# Patient Record
Sex: Male | Born: 1935 | Race: White | Hispanic: No | Marital: Married | Smoking: Never smoker
Health system: Southern US, Community
[De-identification: ages and names within clinical notes are randomized; demographics above are authoritative.]

## PROBLEM LIST (undated history)

## (undated) DIAGNOSIS — F329 Major depressive disorder, single episode, unspecified: Secondary | ICD-10-CM

## (undated) DIAGNOSIS — C4490 Unspecified malignant neoplasm of skin, unspecified: Secondary | ICD-10-CM

## (undated) DIAGNOSIS — E039 Hypothyroidism, unspecified: Secondary | ICD-10-CM

## (undated) DIAGNOSIS — M1990 Unspecified osteoarthritis, unspecified site: Secondary | ICD-10-CM

## (undated) DIAGNOSIS — F419 Anxiety disorder, unspecified: Secondary | ICD-10-CM

## (undated) DIAGNOSIS — I4891 Unspecified atrial fibrillation: Secondary | ICD-10-CM

## (undated) DIAGNOSIS — I2510 Atherosclerotic heart disease of native coronary artery without angina pectoris: Secondary | ICD-10-CM

## (undated) DIAGNOSIS — Z95810 Presence of automatic (implantable) cardiac defibrillator: Secondary | ICD-10-CM

## (undated) DIAGNOSIS — E785 Hyperlipidemia, unspecified: Secondary | ICD-10-CM

## (undated) DIAGNOSIS — I493 Ventricular premature depolarization: Secondary | ICD-10-CM

## (undated) DIAGNOSIS — M47816 Spondylosis without myelopathy or radiculopathy, lumbar region: Secondary | ICD-10-CM

## (undated) DIAGNOSIS — I491 Atrial premature depolarization: Secondary | ICD-10-CM

## (undated) DIAGNOSIS — I255 Ischemic cardiomyopathy: Secondary | ICD-10-CM

## (undated) DIAGNOSIS — Z8679 Personal history of other diseases of the circulatory system: Secondary | ICD-10-CM

## (undated) DIAGNOSIS — Z8719 Personal history of other diseases of the digestive system: Secondary | ICD-10-CM

## (undated) DIAGNOSIS — F32A Depression, unspecified: Secondary | ICD-10-CM

## (undated) DIAGNOSIS — I10 Essential (primary) hypertension: Secondary | ICD-10-CM

## (undated) DIAGNOSIS — I5022 Chronic systolic (congestive) heart failure: Secondary | ICD-10-CM

## (undated) HISTORY — DX: Unspecified osteoarthritis, unspecified site: M19.90

## (undated) HISTORY — PX: SKIN CANCER EXCISION: SHX779

## (undated) HISTORY — PX: LUMBAR FUSION: SHX111

## (undated) HISTORY — DX: Unspecified malignant neoplasm of skin, unspecified: C44.90

## (undated) HISTORY — DX: Ischemic cardiomyopathy: I25.5

## (undated) HISTORY — PX: BACK SURGERY: SHX140

## (undated) HISTORY — DX: Personal history of other diseases of the circulatory system: Z86.79

## (undated) HISTORY — DX: Anxiety disorder, unspecified: F41.9

## (undated) HISTORY — DX: Depression, unspecified: F32.A

## (undated) HISTORY — PX: OTHER SURGICAL HISTORY: SHX169

## (undated) HISTORY — PX: INGUINAL HERNIA REPAIR: SUR1180

## (undated) HISTORY — DX: Hypothyroidism, unspecified: E03.9

## (undated) HISTORY — DX: Major depressive disorder, single episode, unspecified: F32.9

## (undated) HISTORY — PX: MOHS SURGERY: SUR867

## (undated) HISTORY — DX: Presence of automatic (implantable) cardiac defibrillator: Z95.810

## (undated) HISTORY — DX: Unspecified atrial fibrillation: I48.91

## (undated) HISTORY — DX: Hyperlipidemia, unspecified: E78.5

## (undated) HISTORY — DX: Atherosclerotic heart disease of native coronary artery without angina pectoris: I25.10

## (undated) HISTORY — DX: Essential (primary) hypertension: I10

---

## 1999-04-17 ENCOUNTER — Ambulatory Visit (HOSPITAL_BASED_OUTPATIENT_CLINIC_OR_DEPARTMENT_OTHER): Admission: RE | Admit: 1999-04-17 | Discharge: 1999-04-17 | Payer: Self-pay | Admitting: General Surgery

## 1999-05-01 ENCOUNTER — Ambulatory Visit (HOSPITAL_BASED_OUTPATIENT_CLINIC_OR_DEPARTMENT_OTHER): Admission: RE | Admit: 1999-05-01 | Discharge: 1999-05-01 | Payer: Self-pay | Admitting: General Surgery

## 2000-01-22 ENCOUNTER — Encounter: Admission: RE | Admit: 2000-01-22 | Discharge: 2000-01-22 | Payer: Self-pay | Admitting: Neurosurgery

## 2000-01-22 ENCOUNTER — Encounter: Payer: Self-pay | Admitting: Neurosurgery

## 2001-11-22 ENCOUNTER — Emergency Department (HOSPITAL_COMMUNITY): Admission: EM | Admit: 2001-11-22 | Discharge: 2001-11-22 | Payer: Self-pay | Admitting: Emergency Medicine

## 2001-11-22 ENCOUNTER — Encounter: Payer: Self-pay | Admitting: Emergency Medicine

## 2001-12-12 ENCOUNTER — Encounter: Admission: RE | Admit: 2001-12-12 | Discharge: 2001-12-12 | Payer: Self-pay | Admitting: Family Medicine

## 2001-12-12 ENCOUNTER — Encounter: Payer: Self-pay | Admitting: Family Medicine

## 2002-01-09 ENCOUNTER — Encounter: Admission: RE | Admit: 2002-01-09 | Discharge: 2002-01-09 | Payer: Self-pay | Admitting: Neurosurgery

## 2002-01-09 ENCOUNTER — Encounter: Payer: Self-pay | Admitting: Neurosurgery

## 2002-01-24 ENCOUNTER — Encounter: Admission: RE | Admit: 2002-01-24 | Discharge: 2002-01-24 | Payer: Self-pay | Admitting: Neurosurgery

## 2002-01-24 ENCOUNTER — Encounter: Payer: Self-pay | Admitting: Neurosurgery

## 2002-02-15 ENCOUNTER — Encounter: Payer: Self-pay | Admitting: Neurosurgery

## 2002-02-17 ENCOUNTER — Encounter: Payer: Self-pay | Admitting: Neurosurgery

## 2002-02-17 ENCOUNTER — Inpatient Hospital Stay (HOSPITAL_COMMUNITY): Admission: RE | Admit: 2002-02-17 | Discharge: 2002-02-18 | Payer: Self-pay | Admitting: Neurosurgery

## 2002-04-28 ENCOUNTER — Encounter: Payer: Self-pay | Admitting: *Deleted

## 2002-04-28 ENCOUNTER — Inpatient Hospital Stay (HOSPITAL_COMMUNITY): Admission: EM | Admit: 2002-04-28 | Discharge: 2002-05-02 | Payer: Self-pay | Admitting: Emergency Medicine

## 2003-01-08 ENCOUNTER — Encounter: Admission: RE | Admit: 2003-01-08 | Discharge: 2003-01-08 | Payer: Self-pay | Admitting: Neurosurgery

## 2003-01-08 ENCOUNTER — Encounter: Payer: Self-pay | Admitting: Neurosurgery

## 2003-10-23 ENCOUNTER — Encounter: Admission: RE | Admit: 2003-10-23 | Discharge: 2003-11-04 | Payer: Self-pay | Admitting: Cardiology

## 2003-10-23 ENCOUNTER — Inpatient Hospital Stay (HOSPITAL_COMMUNITY): Admission: EM | Admit: 2003-10-23 | Discharge: 2003-11-04 | Payer: Self-pay | Admitting: Emergency Medicine

## 2003-10-24 ENCOUNTER — Encounter: Payer: Self-pay | Admitting: Cardiology

## 2003-12-03 ENCOUNTER — Encounter (HOSPITAL_COMMUNITY): Admission: RE | Admit: 2003-12-03 | Discharge: 2004-03-02 | Payer: Self-pay | Admitting: Cardiology

## 2004-06-11 ENCOUNTER — Inpatient Hospital Stay (HOSPITAL_COMMUNITY): Admission: EM | Admit: 2004-06-11 | Discharge: 2004-06-14 | Payer: Self-pay | Admitting: Emergency Medicine

## 2004-10-01 ENCOUNTER — Encounter: Admission: RE | Admit: 2004-10-01 | Discharge: 2004-10-01 | Payer: Self-pay | Admitting: Family Medicine

## 2004-10-09 ENCOUNTER — Ambulatory Visit: Payer: Self-pay | Admitting: Cardiology

## 2004-12-11 ENCOUNTER — Encounter: Admission: RE | Admit: 2004-12-11 | Discharge: 2004-12-11 | Payer: Self-pay | Admitting: Neurosurgery

## 2004-12-29 ENCOUNTER — Encounter: Admission: RE | Admit: 2004-12-29 | Discharge: 2004-12-29 | Payer: Self-pay | Admitting: Neurosurgery

## 2005-01-15 ENCOUNTER — Encounter: Admission: RE | Admit: 2005-01-15 | Discharge: 2005-01-15 | Payer: Self-pay | Admitting: Neurosurgery

## 2005-01-29 ENCOUNTER — Ambulatory Visit: Payer: Self-pay | Admitting: Cardiology

## 2005-01-30 ENCOUNTER — Encounter: Admission: RE | Admit: 2005-01-30 | Discharge: 2005-01-30 | Payer: Self-pay | Admitting: Neurosurgery

## 2005-04-08 ENCOUNTER — Ambulatory Visit: Payer: Self-pay | Admitting: Cardiology

## 2005-04-29 ENCOUNTER — Ambulatory Visit: Payer: Self-pay | Admitting: Cardiology

## 2005-05-25 ENCOUNTER — Ambulatory Visit: Payer: Self-pay

## 2005-06-05 ENCOUNTER — Ambulatory Visit: Payer: Self-pay | Admitting: Cardiology

## 2005-07-13 ENCOUNTER — Ambulatory Visit: Payer: Self-pay | Admitting: Cardiology

## 2005-10-01 ENCOUNTER — Encounter: Admission: RE | Admit: 2005-10-01 | Discharge: 2005-10-01 | Payer: Self-pay | Admitting: Neurosurgery

## 2005-11-11 ENCOUNTER — Ambulatory Visit: Payer: Self-pay | Admitting: Cardiology

## 2005-11-19 ENCOUNTER — Ambulatory Visit: Payer: Self-pay | Admitting: *Deleted

## 2005-11-19 ENCOUNTER — Ambulatory Visit: Payer: Self-pay

## 2006-03-12 ENCOUNTER — Ambulatory Visit: Payer: Self-pay | Admitting: Cardiology

## 2006-03-25 ENCOUNTER — Ambulatory Visit: Payer: Self-pay | Admitting: Cardiology

## 2006-06-24 ENCOUNTER — Inpatient Hospital Stay (HOSPITAL_COMMUNITY): Admission: EM | Admit: 2006-06-24 | Discharge: 2006-06-25 | Payer: Self-pay | Admitting: Emergency Medicine

## 2006-06-24 ENCOUNTER — Ambulatory Visit: Payer: Self-pay | Admitting: Internal Medicine

## 2006-06-30 ENCOUNTER — Ambulatory Visit: Payer: Self-pay | Admitting: Internal Medicine

## 2006-07-06 ENCOUNTER — Inpatient Hospital Stay (HOSPITAL_COMMUNITY): Admission: RE | Admit: 2006-07-06 | Discharge: 2006-07-07 | Payer: Self-pay | Admitting: Internal Medicine

## 2006-07-06 ENCOUNTER — Ambulatory Visit: Payer: Self-pay | Admitting: Internal Medicine

## 2006-07-20 ENCOUNTER — Ambulatory Visit: Payer: Self-pay | Admitting: Cardiology

## 2006-11-09 ENCOUNTER — Ambulatory Visit: Payer: Self-pay | Admitting: Internal Medicine

## 2006-12-28 ENCOUNTER — Ambulatory Visit: Payer: Self-pay | Admitting: Cardiology

## 2007-03-10 ENCOUNTER — Ambulatory Visit: Payer: Self-pay

## 2007-03-11 ENCOUNTER — Ambulatory Visit: Payer: Self-pay | Admitting: Cardiology

## 2007-05-12 ENCOUNTER — Inpatient Hospital Stay (HOSPITAL_COMMUNITY): Admission: EM | Admit: 2007-05-12 | Discharge: 2007-05-14 | Payer: Self-pay | Admitting: *Deleted

## 2007-05-12 ENCOUNTER — Ambulatory Visit: Payer: Self-pay | Admitting: Cardiology

## 2007-06-10 ENCOUNTER — Ambulatory Visit: Payer: Self-pay | Admitting: Cardiology

## 2007-06-30 ENCOUNTER — Ambulatory Visit: Payer: Self-pay

## 2007-08-21 ENCOUNTER — Ambulatory Visit: Payer: Self-pay | Admitting: Cardiology

## 2007-08-21 ENCOUNTER — Emergency Department (HOSPITAL_COMMUNITY): Admission: EM | Admit: 2007-08-21 | Discharge: 2007-08-21 | Payer: Self-pay | Admitting: Emergency Medicine

## 2007-08-23 ENCOUNTER — Ambulatory Visit: Payer: Self-pay | Admitting: Cardiology

## 2007-09-16 ENCOUNTER — Ambulatory Visit: Payer: Self-pay | Admitting: Cardiology

## 2007-12-01 ENCOUNTER — Ambulatory Visit: Payer: Self-pay

## 2007-12-14 ENCOUNTER — Ambulatory Visit: Payer: Self-pay | Admitting: Cardiology

## 2008-03-13 ENCOUNTER — Ambulatory Visit: Payer: Self-pay | Admitting: Internal Medicine

## 2008-06-06 ENCOUNTER — Ambulatory Visit: Payer: Self-pay | Admitting: Cardiology

## 2008-09-06 ENCOUNTER — Ambulatory Visit: Payer: Self-pay

## 2008-12-17 ENCOUNTER — Encounter: Admission: RE | Admit: 2008-12-17 | Discharge: 2008-12-17 | Payer: Self-pay | Admitting: Neurosurgery

## 2008-12-18 ENCOUNTER — Ambulatory Visit: Payer: Self-pay | Admitting: Internal Medicine

## 2009-01-01 ENCOUNTER — Inpatient Hospital Stay (HOSPITAL_COMMUNITY): Admission: EM | Admit: 2009-01-01 | Discharge: 2009-01-02 | Payer: Self-pay | Admitting: Emergency Medicine

## 2009-01-01 ENCOUNTER — Ambulatory Visit: Payer: Self-pay | Admitting: Cardiovascular Disease

## 2009-01-01 ENCOUNTER — Encounter: Payer: Self-pay | Admitting: Internal Medicine

## 2009-01-16 ENCOUNTER — Ambulatory Visit: Payer: Self-pay | Admitting: Cardiology

## 2009-02-08 ENCOUNTER — Encounter: Admission: RE | Admit: 2009-02-08 | Discharge: 2009-02-08 | Payer: Self-pay | Admitting: Neurosurgery

## 2009-04-08 ENCOUNTER — Ambulatory Visit: Payer: Self-pay | Admitting: Internal Medicine

## 2009-04-09 DIAGNOSIS — M1990 Unspecified osteoarthritis, unspecified site: Secondary | ICD-10-CM | POA: Insufficient documentation

## 2009-04-09 HISTORY — DX: Unspecified osteoarthritis, unspecified site: M19.90

## 2009-04-11 ENCOUNTER — Ambulatory Visit: Payer: Self-pay | Admitting: Internal Medicine

## 2009-06-14 ENCOUNTER — Encounter (INDEPENDENT_AMBULATORY_CARE_PROVIDER_SITE_OTHER): Payer: Self-pay | Admitting: *Deleted

## 2009-06-20 ENCOUNTER — Ambulatory Visit: Payer: Self-pay | Admitting: Internal Medicine

## 2009-07-08 ENCOUNTER — Ambulatory Visit: Payer: Self-pay

## 2009-07-11 ENCOUNTER — Ambulatory Visit: Payer: Self-pay | Admitting: Cardiology

## 2009-07-22 ENCOUNTER — Encounter: Admission: RE | Admit: 2009-07-22 | Discharge: 2009-07-22 | Payer: Self-pay | Admitting: Neurosurgery

## 2009-08-21 ENCOUNTER — Ambulatory Visit: Payer: Self-pay | Admitting: Internal Medicine

## 2009-09-09 ENCOUNTER — Encounter: Admission: RE | Admit: 2009-09-09 | Discharge: 2009-09-09 | Payer: Self-pay | Admitting: Neurosurgery

## 2009-09-19 ENCOUNTER — Encounter: Payer: Self-pay | Admitting: Cardiology

## 2009-09-25 ENCOUNTER — Encounter (INDEPENDENT_AMBULATORY_CARE_PROVIDER_SITE_OTHER): Payer: Self-pay | Admitting: *Deleted

## 2009-10-07 ENCOUNTER — Ambulatory Visit: Payer: Self-pay | Admitting: Internal Medicine

## 2009-11-06 ENCOUNTER — Encounter (INDEPENDENT_AMBULATORY_CARE_PROVIDER_SITE_OTHER): Payer: Self-pay | Admitting: *Deleted

## 2009-12-10 ENCOUNTER — Inpatient Hospital Stay (HOSPITAL_COMMUNITY): Admission: RE | Admit: 2009-12-10 | Discharge: 2009-12-15 | Payer: Self-pay | Admitting: Neurosurgery

## 2010-02-28 ENCOUNTER — Ambulatory Visit: Payer: Self-pay | Admitting: Internal Medicine

## 2010-03-12 ENCOUNTER — Telehealth (INDEPENDENT_AMBULATORY_CARE_PROVIDER_SITE_OTHER): Payer: Self-pay | Admitting: *Deleted

## 2010-04-08 DIAGNOSIS — Z8679 Personal history of other diseases of the circulatory system: Secondary | ICD-10-CM | POA: Insufficient documentation

## 2010-04-10 ENCOUNTER — Ambulatory Visit: Payer: Self-pay | Admitting: Cardiology

## 2010-04-25 ENCOUNTER — Ambulatory Visit: Payer: Self-pay | Admitting: Internal Medicine

## 2010-06-24 ENCOUNTER — Encounter: Payer: Self-pay | Admitting: Internal Medicine

## 2010-08-07 ENCOUNTER — Ambulatory Visit: Payer: Self-pay | Admitting: Internal Medicine

## 2010-09-05 ENCOUNTER — Ambulatory Visit: Payer: Self-pay | Admitting: Internal Medicine

## 2010-10-06 ENCOUNTER — Encounter: Payer: Self-pay | Admitting: Cardiology

## 2010-10-06 ENCOUNTER — Ambulatory Visit: Payer: Self-pay | Admitting: Cardiology

## 2010-11-06 ENCOUNTER — Ambulatory Visit: Payer: Self-pay

## 2010-11-18 ENCOUNTER — Encounter
Admission: RE | Admit: 2010-11-18 | Discharge: 2010-11-18 | Payer: Self-pay | Source: Home / Self Care | Attending: Internal Medicine | Admitting: Internal Medicine

## 2010-11-18 ENCOUNTER — Ambulatory Visit
Admission: RE | Admit: 2010-11-18 | Discharge: 2010-11-18 | Payer: Self-pay | Source: Home / Self Care | Attending: Internal Medicine | Admitting: Internal Medicine

## 2010-12-23 NOTE — Procedures (Signed)
Summary: icd check/medtronic  Medications Added * CALCIUM-VITAMIN D 600-500 MG-UNIT TABS (CALCIUM-VITAMIN D) one by mouth two times a day      Allergies Added: NKDA  Current Medications (verified): 1)  Spironolactone 25 Mg Tabs (Spironolactone) .... Take 1/2 Tablet By Mouth Daily 2)  Aspirin 81 Mg Tbec (Aspirin) .... Take One Tablet By Mouth Daily 3)  Plavix 75 Mg Tabs (Clopidogrel Bisulfate) .... Take One Tablet By Mouth Daily 4)  Carvedilol 6.25 Mg Tabs (Carvedilol) .... Take One Tablet By Mouth Twice A Day 5)  Levothroid 50 Mcg Tabs (Levothyroxine Sodium) .Marland Kitchen.. 1 By Mouth Once Daily 6)  Vytorin 10-40 Mg Tabs (Ezetimibe-Simvastatin) .... Take One Tablet By Mouth Dailyat Bedtime 7)  Ramipril 2.5 Mg Caps (Ramipril) .... Take One Capsule By Mouth Daily 8)  Omeprazole 20 Mg Cpdr (Omeprazole) .... Once Daily 9)  Nitroglycerin 0.4 Mg Subl (Nitroglycerin) .... One Tablet Under Tongue Every 5 Minutes As Needed For Chest Pain---May Repeat Times Three 10)  Calcium-Vitamin D 600-500 Mg-Unit Tabs (Calcium-Vitamin D) .... One By Mouth Two Times A Day  Allergies (verified): No Known Drug Allergies   ICD Specifications Following MD:  Lewayne Bunting, MD     Referring MD:  WALL ICD Vendor:  Medtronic     ICD Model Number:  7232     ICD Serial Number:  [id number] ICD DOI:  07/06/2006     ICD Implanting MD:  Lewayne Bunting, MD  Lead 1:    Location: RV     DOI: 07/06/2006     Model #: [id number]     Serial #: [id number]     Status: active  Indications::  ICM  Explantation Comments: No carelink  ICD Follow Up Remote Check?  No Battery Voltage:  3.07 V     Charge Time:  8.32 seconds     Underlying rhythm:  SR ICD Dependent:  No       ICD Device Measurements Right Ventricle:  Amplitude: 17.7 mV, Impedance: 760 ohms, Threshold: 1.0 V at 0.2 msec Shock Impedance: 46/57 ohms   Episodes Coumadin:  No Shock:  0     ATP:  0     Nonsustained:  0     Ventricular Pacing:  <0.1%  Brady Parameters Mode VVI      Lower Rate Limit:  40      Tachy Zones VF:  200     VT:  240 FVT VIA VF     VT1:  171     Next Cardiology Appt Due:  10/23/2010 Tech Comments:  No parameter changes.  Device function normal.  ROV 3 months clinic. Altha Harm, LPN  August 07, 2010 11:08 AM

## 2010-12-23 NOTE — Assessment & Plan Note (Signed)
Summary: f59m/jss  Medications Added VITAMIN D (ERGOCALCIFEROL) 50000 UNIT CAPS (ERGOCALCIFEROL) 1 cap weekly        Visit Type:  6 mo f/u Primary Provider:  Dr. Marlan Palau  CC:  pt had recent back surgery 12/10/09....no cardiac complaints today.  History of Present Illness: Mr. Ventrella returns today for evaluation and management of his coronary artery disease, previous infarct, chronic LV dysfunction, and defibrillator.  He has had no angina or ischemic symptoms. He denies orthopnea, PND or edema. He was had back surgery about 3 months ago had no complications.  He is very compliant with his medications. His febrile has not fired. Last catheterization was February 2010. Please see below.  Multiple blood pressures checked at time have been normal. His log has been reviewed.  Current Medications (verified): 1)  Spironolactone 25 Mg Tabs (Spironolactone) .... Take 1/2 Tablet By Mouth Daily 2)  Aspirin 81 Mg Tbec (Aspirin) .... Take One Tablet By Mouth Daily 3)  Plavix 75 Mg Tabs (Clopidogrel Bisulfate) .... Take One Tablet By Mouth Daily 4)  Carvedilol 6.25 Mg Tabs (Carvedilol) .... Take One Tablet By Mouth Twice A Day 5)  Levothroid 50 Mcg Tabs (Levothyroxine Sodium) .Marland Kitchen.. 1 By Mouth Once Daily 6)  Vytorin 10-40 Mg Tabs (Ezetimibe-Simvastatin) .... Take One Tablet By Mouth Dailyat Bedtime 7)  Ramipril 2.5 Mg Caps (Ramipril) .... Take One Capsule By Mouth Daily 8)  Prevacid 30 Mg Cpdr (Lansoprazole) .Marland Kitchen.. 1 By Mouth Once Daily 9)  Nitroglycerin 0.4 Mg Subl (Nitroglycerin) .... One Tablet Under Tongue Every 5 Minutes As Needed For Chest Pain---May Repeat Times Three 10)  Vitamin D (Ergocalciferol) 50000 Unit Caps (Ergocalciferol) .Marland Kitchen.. 1 Cap Weekly  Allergies: No Known Drug Allergies  Past History:  Past Medical History: Last updated: 04/08/2010 CAD, NATIVE VESSEL (ICD-414.01) MYOCARDIAL INFARCTION, INFERIOR Dajon Rowe (ICD-410.40) AUTOMATIC IMPLANTABLE CARDIAC DEFIBRILLATOR SITU  (ICD-V45.02) HYPERLIPIDEMIA (ICD-272.4) HYPERTENSION (ICD-401.9) ISCHEMIC CARDIOMYOPATHY (ICD-414.8) CHF (ICD-428.0) HYPOTENSION, ORTHOSTATIC, HX OF (ICD-V12.50) DEGENERATIVE JOINT DISEASE (ICD-715.90) HYPERTHYROIDISM (ICD-242.90)    Past Surgical History: Last updated: 2009-04-15 : Left L5-S1 laminectomy and discectomy  implantation of a Medtronic single chamber defibrillator  07/06/2006  Doylene Canning. Ladona Ridgel, M.D  Family History: Last updated: Apr 15, 2009   Mother died of pneumonia at age 70.  Father died of heart  failure at age 53.  He has a 67 year old brother who is alive and well and  had another brother who died at age 74 of cancer.  Social History: Last updated: 2009-04-15  He lives with [place] with his wife.  He is retired from  the [employer].  He is married and has two grown sons.  He denies any  tobacco, alcohol, or drug use.  He walks approximately 30 minutes daily but  has done much less walking this past week as a result of these symptoms.  Review of Systems       negative history of present illness  Vital Signs:  Patient profile:   77 year old male Height:      70 inches Weight:      155 pounds BMI:     22.32 Pulse rate:   72 / minute Pulse rhythm:   regular BP sitting:   154 / 80  (left arm) Cuff size:   large  Vitals Entered By: Danielle Rankin, CMA (Apr 10, 2010 3:00 PM)  Physical Exam  General:  thin, no acute distress Head:  normocephalic and atraumatic Eyes:  PERRLA/EOM intact; conjunctiva and lids normal. Neck:  Neck supple, no  JVD. No masses, thyromegaly or abnormal cervical nodes. Lungs:  Clear bilaterally to auscultation and percussion. Heart:  PMI inferior laterally displaced, normal S1 split S2-S4 soft systolic murmur. No S3 gallop. Abdomen:  Bowel sounds positive; abdomen soft and non-tender without masses, organomegaly, or hernias noted. No hepatosplenomegaly. Msk:  decreased ROM.   Pulses:  pulses normal in all 4  extremities Extremities:  No clubbing or cyanosis. Neurologic:  Alert and oriented x 3. Skin:  Intact without lesions or rashes. Psych:  Normal affect.    ICD Specifications Following MD:  Lewayne Bunting, MD     Referring MD:  Eliyah Bazzi ICD Vendor:  Medtronic     ICD Model Number:  7232     ICD Serial Number:  [id number] ICD DOI:  07/06/2006     ICD Implanting MD:  Lewayne Bunting, MD  Lead 1:    Location: RV     DOI: 07/06/2006     Model #: [id number]     Serial #: [id number]     Status: active  Indications::  ICM  Explantation Comments: No carelink  ICD Follow Up ICD Dependent:  No      Episodes Coumadin:  No  Brady Parameters Mode VVI     Lower Rate Limit:  40      Tachy Zones VF:  200     VT:  240 FVT VIA VF     VT1:  171     Impression & Recommendations:  Problem # 1:  CAD, NATIVE VESSEL (ICD-414.01) Assessment Unchanged He has had no symptoms of coronary ischemia or angina. There is no signs or symptoms of decompensated heart failure. His sister but has not fired. We'll continue current medical plan and see back in 6 months. His updated medication list for this problem includes:    Aspirin 81 Mg Tbec (Aspirin) .Marland Kitchen... Take one tablet by mouth daily    Plavix 75 Mg Tabs (Clopidogrel bisulfate) .Marland Kitchen... Take one tablet by mouth daily    Carvedilol 6.25 Mg Tabs (Carvedilol) .Marland Kitchen... Take one tablet by mouth twice a day    Ramipril 2.5 Mg Caps (Ramipril) .Marland Kitchen... Take one capsule by mouth daily    Nitroglycerin 0.4 Mg Subl (Nitroglycerin) ..... One tablet under tongue every 5 minutes as needed for chest pain---may repeat times three  Problem # 2:  ISCHEMIC CARDIOMYOPATHY (ICD-414.8) Assessment: Unchanged  His updated medication list for this problem includes:    Spironolactone 25 Mg Tabs (Spironolactone) .Marland Kitchen... Take 1/2 tablet by mouth daily    Aspirin 81 Mg Tbec (Aspirin) .Marland Kitchen... Take one tablet by mouth daily    Plavix 75 Mg Tabs (Clopidogrel bisulfate) .Marland Kitchen... Take one tablet by mouth daily     Carvedilol 6.25 Mg Tabs (Carvedilol) .Marland Kitchen... Take one tablet by mouth twice a day    Ramipril 2.5 Mg Caps (Ramipril) .Marland Kitchen... Take one capsule by mouth daily    Nitroglycerin 0.4 Mg Subl (Nitroglycerin) ..... One tablet under tongue every 5 minutes as needed for chest pain---may repeat times three  Problem # 3:  AUTOMATIC IMPLANTABLE CARDIAC DEFIBRILLATOR SITU (ICD-V45.02) Assessment: Unchanged  Problem # 4:  HYPERLIPIDEMIA (ICD-272.4)  His updated medication list for this problem includes:    Vytorin 10-40 Mg Tabs (Ezetimibe-simvastatin) .Marland Kitchen... Take one tablet by mouth dailyat bedtime  Problem # 5:  HYPERTENSION (ICD-401.9)  His updated medication list for this problem includes:    Spironolactone 25 Mg Tabs (Spironolactone) .Marland Kitchen... Take 1/2 tablet by mouth daily    Aspirin 81 Mg Tbec (  Aspirin) .Marland Kitchen... Take one tablet by mouth daily    Carvedilol 6.25 Mg Tabs (Carvedilol) .Marland Kitchen... Take one tablet by mouth twice a day    Ramipril 2.5 Mg Caps (Ramipril) .Marland Kitchen... Take one capsule by mouth daily  Clinical Reports Reviewed:  Cardiac Cath:  01/02/2009: Cardiac Cath Findings:  IMPRESSION:   1. Stable single-vessel coronary artery disease with patent stented       segment in the proximal to mid left anterior descending.   2. Segmental left ventricular dysfunction that is stable in       appearance.      RECOMMENDATIONS:  I recommend continued medical management of this   patient's coronary artery disease.  The patient will have his   defibrillator interrogated in the holding area and then will be   discharged to home later today.  He has followup scheduled already with   Dr. Juanito Doom on January 16, 2009.   05/13/2007: Cardiac Cath Findings:  FINDINGS:   1. LV:  119/4/9.  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  There is no       aortic stenosis or mitral regurgitation.   2. Left main:  Angiographically normal.   3. LAD:  Moderate-sized vessel giving rise to a moderate-sized first       and small  second diagonal branch.  The previously placed stent in       the mid-LAD is widely patent with no in-stent restenosis.  There       are only minor luminal irregularities downstream from the stent.       The first diagonal branch has an ostial 80% stenosis which is       unchanged from prior.   4. Ramus intermedius:  Small vessel which is angiographically normal.   5. Circumflex:  Large codominant vessel giving rise to two obtuse       marginals and a PDA.  It is angiographically normal.   6. RCA:  Small codominant vessel.  It is angiographically normal. .      IMPRESSION/RECOMMENDATIONS:  There is been no change in his coronary   anatomy compared with August 2007.  I therefore suspect a noncardiac   etiology to the episode of chest pain that he suffered yesterday.  Will   discharge home.  If pain recurs will look further into other causes.               Salvadore Farber, MD   Electronically Signed   Nuclear Study:  11/19/2005:  Exercise Capacity:Adenosine study with no exercise  Blood Pressure response:Normal BP rrsponse Clinical symptoms:Atypical CP ECG impression:Baseline: NSR; No significant St segment change with adenosine Overall Impression:Abnormal stress nuclear study  10/30/2003:  RESEARCH NM MYO SPECT     Technetium 56m sestamibi was injected for a nuclear medicine   myocardial SPECT study.     IMPRESSION     This exam was complete as part of a research project conducted by   [facility].  No radiologist interpretation    Read By:  Services, Radiology,  Radiologist   Released By:  Services, Radiology,  Radiologist   Patient Instructions: 1)  Your physician recommends that you schedule a follow-up appointment in: 6 MONTHS WITH DR Eloina Ergle 2)  Your physician recommends that you continue on your current medications as directed. Please refer to the Current Medication list given to you today. Prescriptions: NITROGLYCERIN 0.4 MG SUBL (NITROGLYCERIN) One tablet  under tongue every 5 minutes as needed for chest pain---may  repeat times three  #25 x 4   Entered by:   Scherrie Bateman, LPN   Authorized by:   Gaylord Shih, MD, [facility]   Signed by:   Scherrie Bateman, LPN on 16/08/9603   Method used:   Electronically to        [id number] (retail)       [address]       [place], [place]  [address]       Ph: [phone] or [phone]       Fax: [phone]   RxID:   [id number]

## 2010-12-23 NOTE — Progress Notes (Signed)
   Walk in Patient Form Recieved " Pt needs RX complete and faxed In " sent ot Maryan Puls [facility]  March 12, 2010 2:16 PM

## 2010-12-23 NOTE — Assessment & Plan Note (Signed)
Summary: defib check  Medications Added OMEPRAZOLE 20 MG CPDR (OMEPRAZOLE) once daily      Allergies Added: NKDA  Visit Type:  Follow-up Primary Provider:  Dr. Marlan Palau   History of Present Illness: [name] returns today for evaluation of his ICD and ongoing CHF followup. He has had no angina or ischemic symptoms. He denies orthopnea, PND or edema. He had back surgery about 3 months ago had no complications.  He denies any intercurrent ICD therapies.     Current Medications (verified): 1)  Spironolactone 25 Mg Tabs (Spironolactone) .... Take 1/2 Tablet By Mouth Daily 2)  Aspirin 81 Mg Tbec (Aspirin) .... Take One Tablet By Mouth Daily 3)  Plavix 75 Mg Tabs (Clopidogrel Bisulfate) .... Take One Tablet By Mouth Daily 4)  Carvedilol 6.25 Mg Tabs (Carvedilol) .... Take One Tablet By Mouth Twice A Day 5)  Levothroid 50 Mcg Tabs (Levothyroxine Sodium) .Marland Kitchen.. 1 By Mouth Once Daily 6)  Vytorin 10-40 Mg Tabs (Ezetimibe-Simvastatin) .... Take One Tablet By Mouth Dailyat Bedtime 7)  Ramipril 2.5 Mg Caps (Ramipril) .... Take One Capsule By Mouth Daily 8)  Omeprazole 20 Mg Cpdr (Omeprazole) .... Once Daily 9)  Nitroglycerin 0.4 Mg Subl (Nitroglycerin) .... One Tablet Under Tongue Every 5 Minutes As Needed For Chest Pain---May Repeat Times Three 10)  Vitamin D (Ergocalciferol) 50000 Unit Caps (Ergocalciferol) .Marland Kitchen.. 1 Cap Weekly  Allergies (verified): No Known Drug Allergies  Past History:  Past Medical History: Last updated: 04/08/2010 CAD, NATIVE VESSEL (ICD-414.01) MYOCARDIAL INFARCTION, INFERIOR WALL (ICD-410.40) AUTOMATIC IMPLANTABLE CARDIAC DEFIBRILLATOR SITU (ICD-V45.02) HYPERLIPIDEMIA (ICD-272.4) HYPERTENSION (ICD-401.9) ISCHEMIC CARDIOMYOPATHY (ICD-414.8) CHF (ICD-428.0) HYPOTENSION, ORTHOSTATIC, HX OF (ICD-V12.50) DEGENERATIVE JOINT DISEASE (ICD-715.90) HYPERTHYROIDISM (ICD-242.90)    Past Surgical History: Last updated: 04/09/2009 : Left L5-S1 laminectomy and  discectomy  implantation of a Medtronic single chamber defibrillator  07/06/2006  Corey Jordan, M.D  Review of Systems  The patient denies chest pain, syncope, dyspnea on exertion, and peripheral edema.    Vital Signs:  Patient profile:   77 year old male Height:      70 inches Weight:      154 pounds BMI:     22.18 Pulse rate:   62 / minute BP sitting:   130 / 80  (left arm)  Vitals Entered By: Laurance Flatten CMA (April 25, 2010 12:37 PM)  Physical Exam  General:  thin, no acute distress Head:  normocephalic and atraumatic Eyes:  PERRLA/EOM intact; conjunctiva and lids normal. Mouth:  Teeth, gums and palate normal. Oral mucosa normal. Neck:  Neck supple, no JVD. No masses, thyromegaly or abnormal cervical nodes. Chest Wall:  Well healed ICD incision. Lungs:  Clear bilaterally to auscultation with no wheezes, rales, or rhonchi. Heart:  PMI inferior laterally displaced, normal S1 split S2-S4 soft systolic murmur. No S3 gallop. Abdomen:  Bowel sounds positive; abdomen soft and non-tender without masses, organomegaly, or hernias noted. No hepatosplenomegaly. Msk:  decreased ROM.   Pulses:  pulses normal in all 4 extremities Extremities:  No clubbing or cyanosis. Neurologic:  Alert and oriented x 3.    ICD Specifications Following MD:  Lewayne Bunting, MD     Referring MD:  WALL ICD Vendor:  Medtronic     ICD Model Number:  7232     ICD Serial Number:  [id number] ICD DOI:  07/06/2006     ICD Implanting MD:  Lewayne Bunting, MD  Lead 1:    Location: RV     DOI: 07/06/2006  Model #: [id number]     Serial #: [id number]     Status: active  Indications::  ICM  Explantation Comments: No carelink  ICD Follow Up Battery Voltage:  3.10 V     Charge Time:  8.23 seconds     Underlying rhythm:  sr ICD Dependent:  No       ICD Device Measurements Right Ventricle:  Amplitude: 17.4 mV, Impedance: 704 ohms, Threshold: 0.5 V at 0.5 msec  Episodes MS Episodes:  0     Percent Mode Switch:  0      Coumadin:  No Shock:  0     ATP:  0     Nonsustained:  0     Atrial Therapies:  0  Brady Parameters Mode VVI     Lower Rate Limit:  40      Tachy Zones VF:  200     VT:  240 FVT VIA VF     VT1:  171     Tech Comments:  NORMAL DEVICE FUNCTION.  CHANGED NID FOR VF FROM 18/24 TO 30/40 AND VT FROM 16 TO 28.  ROV IN 3 MTHS. Vella Kohler  April 25, 2010 1:23 PM MD Comments:  Agree with above.  Impression & Recommendations:  Problem # 1:  AUTOMATIC IMPLANTABLE CARDIAC DEFIBRILLATOR SITU (ICD-V45.02) HIs device is working normally.  Will recheck in several months.  Problem # 2:  ISCHEMIC CARDIOMYOPATHY (ICD-414.8) He denies anginal symptoms.  He will continue his current meds. His updated medication list for this problem includes:    Spironolactone 25 Mg Tabs (Spironolactone) .Marland Kitchen... Take 1/2 tablet by mouth daily    Aspirin 81 Mg Tbec (Aspirin) .Marland Kitchen... Take one tablet by mouth daily    Plavix 75 Mg Tabs (Clopidogrel bisulfate) .Marland Kitchen... Take one tablet by mouth daily    Carvedilol 6.25 Mg Tabs (Carvedilol) .Marland Kitchen... Take one tablet by mouth twice a day    Ramipril 2.5 Mg Caps (Ramipril) .Marland Kitchen... Take one capsule by mouth daily    Nitroglycerin 0.4 Mg Subl (Nitroglycerin) ..... One tablet under tongue every 5 minutes as needed for chest pain---may repeat times three  Problem # 3:  HYPERTENSION (ICD-401.9) His blood pressure is well controlled.  A low sodium diet is recommended. His updated medication list for this problem includes:    Spironolactone 25 Mg Tabs (Spironolactone) .Marland Kitchen... Take 1/2 tablet by mouth daily    Aspirin 81 Mg Tbec (Aspirin) .Marland Kitchen... Take one tablet by mouth daily    Carvedilol 6.25 Mg Tabs (Carvedilol) .Marland Kitchen... Take one tablet by mouth twice a day    Ramipril 2.5 Mg Caps (Ramipril) .Marland Kitchen... Take one capsule by mouth daily  Patient Instructions: 1)  Your physician recommends that you schedule a follow-up appointment in: 12 months with Dr Ladona Jordan

## 2010-12-23 NOTE — Assessment & Plan Note (Signed)
Summary: ROV/SL  Medications Added [facility] CALCIUM-600/VIT D 600-200 MG-UNIT TABS (CALCIUM-VITAMIN D) 1 tab  two times a day      Allergies Added: NKDA  Visit Type:  rov Primary Provider:  Dr. Marlan Palau  CC:  pt states he had some sob a couple of months ago but states this is better now...denies any other complaints today.  History of Present Illness: Mr. Brister returns for evaluation and management as coronary artery disease, history of myocardial infarction, and chronic congestive heart failure.  He denies any angina or ischemic symptoms. Last catheterization was last year which showed a patent stent in the proximal LAD.   He denies any palpitations, no firing of his defibrillator, orthopnea, PND or edema. His weight has been stable. He raked leaves all weekend.  Blood pressure log demonstrates excellent control with  blood pressures averaging about 120/70.  Current Medications (verified): 1)  Spironolactone 25 Mg Tabs (Spironolactone) .... Take 1/2 Tablet By Mouth Daily 2)  Aspirin 81 Mg Tbec (Aspirin) .... Take One Tablet By Mouth Daily 3)  Plavix 75 Mg Tabs (Clopidogrel Bisulfate) .... Take One Tablet By Mouth Daily 4)  Carvedilol 6.25 Mg Tabs (Carvedilol) .... Take One Tablet By Mouth Twice A Day 5)  Levothroid 50 Mcg Tabs (Levothyroxine Sodium) .Marland Kitchen.. 1 By Mouth Once Daily 6)  Vytorin 10-40 Mg Tabs (Ezetimibe-Simvastatin) .... Take One Tablet By Mouth Dailyat Bedtime 7)  Ramipril 2.5 Mg Caps (Ramipril) .... Take One Capsule By Mouth Daily 8)  Omeprazole 20 Mg Cpdr (Omeprazole) .... Once Daily 9)  Nitroglycerin 0.4 Mg Subl (Nitroglycerin) .... One Tablet Under Tongue Every 5 Minutes As Needed For Chest Pain---May Repeat Times Three 10)  [facility] Calcium-600/vit D 600-200 Mg-Unit Tabs (Calcium-Vitamin D) .Marland Kitchen.. 1 Tab  Two Times A Day  Allergies (verified): No Known Drug Allergies  Past History:  Past Medical History: Last updated: 04/08/2010 CAD, NATIVE VESSEL  (ICD-414.01) MYOCARDIAL INFARCTION, INFERIOR WALL (ICD-410.40) AUTOMATIC IMPLANTABLE CARDIAC DEFIBRILLATOR SITU (ICD-V45.02) HYPERLIPIDEMIA (ICD-272.4) HYPERTENSION (ICD-401.9) ISCHEMIC CARDIOMYOPATHY (ICD-414.8) CHF (ICD-428.0) HYPOTENSION, ORTHOSTATIC, HX OF (ICD-V12.50) DEGENERATIVE JOINT DISEASE (ICD-715.90) HYPERTHYROIDISM (ICD-242.90)    Past Surgical History: Last updated: 22-Apr-2009 : Left L5-S1 laminectomy and discectomy  implantation of a Medtronic single chamber defibrillator  07/06/2006  Doylene Canning. Ladona Ridgel, M.D  Family History: Last updated: 04/22/2009   Mother died of pneumonia at age 58.  Father died of heart  failure at age 64.  He has a 82 year old brother who is alive and well and  had another brother who died at age 39 of cancer.  Social History: Last updated: 04/22/2009  He lives with [place] with his wife.  He is retired from  the post [employer].  He is married and has two grown sons.  He denies any  tobacco, alcohol, or drug use.  He walks approximately 30 minutes daily but  has done much less walking this past week as a result of these symptoms.  Review of Systems       negative other than history of present illness  Vital Signs:  Patient profile:   77 year old male Height:      70 inches Weight:      150 pounds BMI:     21.60 Pulse rate:   63 / minute Pulse rhythm:   irregular BP sitting:   138 / 78  (left arm) Cuff size:   large  Vitals Entered By: Danielle Rankin, CMA (October 06, 2010 1:36 PM)  Physical Exam  General:  Well developed,  well nourished, in no acute distress. Head:  normocephalic and atraumatic Eyes:  PERRLA/EOM intact; conjunctiva and lids normal. Neck:  Neck supple, no JVD. No masses, thyromegaly or abnormal cervical nodes. Chest Wall:  no deformities or breast masses noted Lungs:  Clear bilaterally to auscultation and percussion. Heart:  PMI displaced inferolaterally, normal S1-S2, no gallop, carotid upstrokes equal  bilaterally without bruits Abdomen:  positive bowel sounds, nondistended, no bruit Msk:  Back normal, normal gait. Muscle strength and tone normal. Extremities:  dependent rubor Neurologic:  Alert and oriented x 3. Skin:  Intact without lesions or rashes. Psych:  Normal affect.    ICD Specifications Following MD:  Lewayne Bunting, MD     Referring MD:  WALL ICD Vendor:  Medtronic     ICD Model Number:  7232     ICD Serial Number:  [id number] ICD DOI:  07/06/2006     ICD Implanting MD:  Lewayne Bunting, MD  Lead 1:    Location: RV     DOI: 07/06/2006     Model #: [id number]     Serial #: [id number]     Status: active  Indications::  ICM  Explantation Comments: No carelink  ICD Follow Up ICD Dependent:  No      Episodes Coumadin:  No  Brady Parameters Mode VVI     Lower Rate Limit:  40      Tachy Zones VF:  200     VT:  240 FVT VIA VF     VT1:  171     Impression & Recommendations:  Problem # 1:  CAD, NATIVE VESSEL (ICD-414.01) Assessment Unchanged  continue medical therapy His updated medication list for this problem includes:    Aspirin 81 Mg Tbec (Aspirin) .Marland Kitchen... Take one tablet by mouth daily    Plavix 75 Mg Tabs (Clopidogrel bisulfate) .Marland Kitchen... Take one tablet by mouth daily    Carvedilol 6.25 Mg Tabs (Carvedilol) .Marland Kitchen... Take one tablet by mouth twice a day    Ramipril 2.5 Mg Caps (Ramipril) .Marland Kitchen... Take one capsule by mouth daily    Nitroglycerin 0.4 Mg Subl (Nitroglycerin) ..... One tablet under tongue every 5 minutes as needed for chest pain---may repeat times three  Orders: EKG w/ Interpretation (93000)  Problem # 2:  AUTOMATIC IMPLANTABLE CARDIAC DEFIBRILLATOR SITU (ICD-V45.02) Assessment: Unchanged  Problem # 3:  HYPERLIPIDEMIA (ICD-272.4)  His updated medication list for this problem includes:    Vytorin 10-40 Mg Tabs (Ezetimibe-simvastatin) .Marland Kitchen... Take one tablet by mouth dailyat bedtime  Problem # 4:  HYPERTENSION (ICD-401.9) Assessment: Improved  His updated  medication list for this problem includes:    Spironolactone 25 Mg Tabs (Spironolactone) .Marland Kitchen... Take 1/2 tablet by mouth daily    Aspirin 81 Mg Tbec (Aspirin) .Marland Kitchen... Take one tablet by mouth daily    Carvedilol 6.25 Mg Tabs (Carvedilol) .Marland Kitchen... Take one tablet by mouth twice a day    Ramipril 2.5 Mg Caps (Ramipril) .Marland Kitchen... Take one capsule by mouth daily  Clinical Reports Reviewed:  Cardiac Cath:  01/02/2009: Cardiac Cath Findings:  IMPRESSION:   1. Stable single-vessel coronary artery disease with patent stented       segment in the proximal to mid left anterior descending.   2. Segmental left ventricular dysfunction that is stable in       appearance.      RECOMMENDATIONS:  I recommend continued medical management of this   patient's coronary artery disease.  The patient will have his   defibrillator interrogated  in the holding area and then will be   discharged to home later today.  He has followup scheduled already with   Dr. Juanito Doom on January 16, 2009.   05/13/2007: Cardiac Cath Findings:  FINDINGS:   1. LV:  119/4/9.  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  There is no       aortic stenosis or mitral regurgitation.   2. Left main:  Angiographically normal.   3. LAD:  Moderate-sized vessel giving rise to a moderate-sized first       and small second diagonal branch.  The previously placed stent in       the mid-LAD is widely patent with no in-stent restenosis.  There       are only minor luminal irregularities downstream from the stent.       The first diagonal branch has an ostial 80% stenosis which is       unchanged from prior.   4. Ramus intermedius:  Small vessel which is angiographically normal.   5. Circumflex:  Large codominant vessel giving rise to two obtuse       marginals and a PDA.  It is angiographically normal.   6. RCA:  Small codominant vessel.  It is angiographically normal. .      IMPRESSION/RECOMMENDATIONS:  There is been no change in his coronary   anatomy  compared with August 2007.  I therefore suspect a noncardiac   etiology to the episode of chest pain that he suffered yesterday.  Will   discharge home.  If pain recurs will look further into other causes.               Salvadore Farber, MD   Electronically Signed   Nuclear Study:  11/19/2005:  Exercise Capacity:Adenosine study with no exercise  Blood Pressure response:Normal BP rrsponse Clinical symptoms:Atypical CP ECG impression:Baseline: NSR; No significant St segment change with adenosine Overall Impression:Abnormal stress nuclear study  10/30/2003:  RESEARCH NM MYO SPECT     Technetium 48m sestamibi was injected for a nuclear medicine   myocardial SPECT study.     IMPRESSION     This exam was complete as part of a research project conducted by   [facility].  No radiologist interpretation    Read By:  Services, Radiology,  Radiologist   Released By:  Services, Radiology,  Radiologist   Patient Instructions: 1)  Your physician recommends that you schedule a follow-up appointment in: 6 months with Dr. Daleen Squibb 2)  Your physician recommends that you continue on your current medications as directed. Please refer to the Current Medication list given to you today.

## 2010-12-25 NOTE — Procedures (Signed)
Summary: DEVICE CHECK/SL      Allergies Added: NKDA  Current Medications (verified): 1)  Spironolactone 25 Mg Tabs (Spironolactone) .... Take 1/2 Tablet By Mouth Daily 2)  Aspirin 81 Mg Tbec (Aspirin) .... Take One Tablet By Mouth Daily 3)  Plavix 75 Mg Tabs (Clopidogrel Bisulfate) .... Take One Tablet By Mouth Daily 4)  Carvedilol 6.25 Mg Tabs (Carvedilol) .... Take One Tablet By Mouth Twice A Day 5)  Levothroid 50 Mcg Tabs (Levothyroxine Sodium) .Marland Kitchen.. 1 By Mouth Once Daily 6)  Vytorin 10-40 Mg Tabs (Ezetimibe-Simvastatin) .... Take One Tablet By Mouth Dailyat Bedtime 7)  Ramipril 2.5 Mg Caps (Ramipril) .... Take One Capsule By Mouth Daily 8)  Omeprazole 20 Mg Cpdr (Omeprazole) .... Once Daily 9)  Nitroglycerin 0.4 Mg Subl (Nitroglycerin) .... One Tablet Under Tongue Every 5 Minutes As Needed For Chest Pain---May Repeat Times Three 10)  [facility] Calcium-600/vit D 600-200 Mg-Unit Tabs (Calcium-Vitamin D) .Marland Kitchen.. 1 Tab  Two Times A Day  Allergies (verified): No Known Drug Allergies   ICD Specifications Following MD:  Lewayne Bunting, MD     Referring MD:  WALL ICD Vendor:  Medtronic     ICD Model Number:  7232     ICD Serial Number:  [id number] ICD DOI:  07/06/2006     ICD Implanting MD:  Lewayne Bunting, MD  Lead 1:    Location: RV     DOI: 07/06/2006     Model #: [id number]     Serial #: [id number]     Status: active  Indications::  ICM  Explantation Comments: No carelink  ICD Follow Up Remote Check?  No Battery Voltage:  3.09 V     Charge Time:  8.32 seconds     Underlying rhythm:  SR ICD Dependent:  No       ICD Device Measurements Right Ventricle:  Amplitude: 17.6 mV, Impedance: 744 ohms, Threshold: 1.0 V at 0.2 msec Shock Impedance: 45/56 ohms   Episodes Coumadin:  No Shock:  0     ATP:  0     Nonsustained:  1      Brady Parameters Mode VVI     Lower Rate Limit:  40      Tachy Zones VF:  200     VT:  240 FVT VIA VF     VT1:  171     Next Cardiology Appt Due:  01/22/2011 Tech  Comments:  No parameter changes.  Device function normal.  No Carelink @ this time.  ROV 3 months clinic. Altha Harm, LPN  November 06, 2010 2:34 PM

## 2011-02-08 LAB — DIFFERENTIAL
Eosinophils Relative: 3 % (ref 0–5)
Lymphocytes Relative: 13 % (ref 12–46)
Lymphs Abs: 0.8 10*3/uL (ref 0.7–4.0)
Monocytes Absolute: 0.7 10*3/uL (ref 0.1–1.0)
Monocytes Relative: 12 % (ref 3–12)

## 2011-02-08 LAB — ABO/RH: ABO/RH(D): O POS

## 2011-02-08 LAB — COMPREHENSIVE METABOLIC PANEL
AST: 21 U/L (ref 0–37)
Albumin: 3.9 g/dL (ref 3.5–5.2)
Calcium: 9.5 mg/dL (ref 8.4–10.5)
Creatinine, Ser: 0.94 mg/dL (ref 0.4–1.5)
GFR calc Af Amer: >60 mL/min (ref >60)
GFR calc non Af Amer: >60 mL/min (ref >60)
Total Protein: 6.4 g/dL (ref 6.0–8.3)

## 2011-02-08 LAB — TYPE AND SCREEN: ABO/RH(D): O POS

## 2011-02-08 LAB — CBC
MCHC: 34.1 g/dL (ref 30.0–36.0)
MCV: 92.9 fL (ref 78.0–100.0)
Platelets: 177 10*3/uL (ref 150–400)
RDW: 13 % (ref 11.5–15.5)

## 2011-02-08 LAB — URINALYSIS, ROUTINE W REFLEX MICROSCOPIC
Glucose, UA: NEGATIVE mg/dL
Protein, ur: NEGATIVE mg/dL
Specific Gravity, Urine: 1.019 (ref 1.005–1.030)

## 2011-02-08 LAB — PROTIME-INR: Prothrombin Time: 12.8 seconds (ref 11.6–15.2)

## 2011-02-11 ENCOUNTER — Ambulatory Visit (INDEPENDENT_AMBULATORY_CARE_PROVIDER_SITE_OTHER): Payer: Medicare Other | Admitting: *Deleted

## 2011-02-11 DIAGNOSIS — I428 Other cardiomyopathies: Secondary | ICD-10-CM

## 2011-03-10 LAB — POCT CARDIAC MARKERS
CKMB, poc: <1 ng/mL — ABNORMAL LOW (ref 1.0–8.0)
CKMB, poc: <1 ng/mL — ABNORMAL LOW (ref 1.0–8.0)
Myoglobin, poc: 67.5 ng/mL (ref 12–200)
Myoglobin, poc: 81.3 ng/mL (ref 12–200)
Troponin i, poc: <0.05 ng/mL (ref 0.00–0.09)
Troponin i, poc: <0.05 ng/mL (ref 0.00–0.09)

## 2011-03-10 LAB — COMPREHENSIVE METABOLIC PANEL
CO2: 31 mEq/L (ref 19–32)
Calcium: 9.3 mg/dL (ref 8.4–10.5)
Creatinine, Ser: 0.89 mg/dL (ref 0.4–1.5)
GFR calc non Af Amer: >60 mL/min (ref >60)
Glucose, Bld: 96 mg/dL (ref 70–99)

## 2011-03-10 LAB — CBC
Hemoglobin: 12.6 g/dL — ABNORMAL LOW (ref 13.0–17.0)
Hemoglobin: 13.1 g/dL (ref 13.0–17.0)
MCHC: 33.9 g/dL (ref 30.0–36.0)
MCHC: 34.4 g/dL (ref 30.0–36.0)
MCV: 92.7 fL (ref 78.0–100.0)
RBC: 3.93 MIL/uL — ABNORMAL LOW (ref 4.22–5.81)
RBC: 4.17 MIL/uL — ABNORMAL LOW (ref 4.22–5.81)

## 2011-03-10 LAB — DIFFERENTIAL
Eosinophils Absolute: 0.1 10*3/uL (ref 0.0–0.7)
Lymphocytes Relative: 14 % (ref 12–46)
Lymphs Abs: 0.9 10*3/uL (ref 0.7–4.0)
Neutrophils Relative %: 76 % (ref 43–77)

## 2011-03-10 LAB — PROTIME-INR
INR: 1 (ref 0.00–1.49)
Prothrombin Time: 13.8 seconds (ref 11.6–15.2)

## 2011-03-10 LAB — URINALYSIS, ROUTINE W REFLEX MICROSCOPIC
Bilirubin Urine: NEGATIVE
Hgb urine dipstick: NEGATIVE
Specific Gravity, Urine: 1.016 (ref 1.005–1.030)
pH: 6 (ref 5.0–8.0)

## 2011-03-10 LAB — CARDIAC PANEL(CRET KIN+CKTOT+MB+TROPI)
CK, MB: 1.5 ng/mL (ref 0.3–4.0)
Relative Index: 1.5 (ref 0.0–2.5)
Relative Index: 1.6 (ref 0.0–2.5)
Troponin I: <0.01 ng/mL (ref 0.00–0.06)

## 2011-03-10 LAB — MAGNESIUM
Magnesium: 2.2 mg/dL (ref 1.5–2.5)
Magnesium: 2.3 mg/dL (ref 1.5–2.5)

## 2011-03-10 LAB — TSH: TSH: 1.741 u[IU]/mL (ref 0.350–4.500)

## 2011-03-10 LAB — HEPARIN LEVEL (UNFRACTIONATED): Heparin Unfractionated: 0.15 IU/mL — ABNORMAL LOW (ref 0.30–0.70)

## 2011-03-13 ENCOUNTER — Encounter (INDEPENDENT_AMBULATORY_CARE_PROVIDER_SITE_OTHER): Payer: Medicare Other | Admitting: Internal Medicine

## 2011-03-13 DIAGNOSIS — E039 Hypothyroidism, unspecified: Secondary | ICD-10-CM

## 2011-03-13 DIAGNOSIS — I10 Essential (primary) hypertension: Secondary | ICD-10-CM

## 2011-03-13 DIAGNOSIS — Z0000 Encounter for general adult medical examination without abnormal findings: Secondary | ICD-10-CM

## 2011-03-30 ENCOUNTER — Telehealth: Payer: Self-pay | Admitting: *Deleted

## 2011-03-30 ENCOUNTER — Other Ambulatory Visit: Payer: Self-pay | Admitting: *Deleted

## 2011-03-30 MED ORDER — EZETIMIBE-SIMVASTATIN 10-40 MG PO TABS: 1.0000 | ORAL_TABLET | Freq: Every day | ORAL | Status: DC

## 2011-03-30 MED ORDER — SPIRONOLACTONE 25 MG PO TABS: 25.0000 mg | ORAL_TABLET | Freq: Every day | ORAL | Status: DC

## 2011-03-30 NOTE — Telephone Encounter (Signed)
ERROR

## 2011-04-07 NOTE — H&P (Signed)
NAME:  Corey Jordan, Corey Jordan NO.:  [id number]   MEDICAL RECORD NO.:  [id number]          PATIENT TYPE:  OBV   LOCATION:  3737                         FACILITY:  [facility]   PHYSICIAN:  Noralyn Pick. Eden Emms, MD, FACCDATE OF BIRTH:  [birth year]   DATE OF ADMISSION:  01/01/2009  DATE OF DISCHARGE:                              HISTORY & PHYSICAL   PRIMARY CARDIOLOGIST:  Maisie Fus C. Daleen Squibb, MD, [facility]   PRIMARY ELECTROPHYSIOLOGIST:  Doylene Canning. Ladona Ridgel, MD   PRIMARY MEDICAL DOCTOR:  Luanna Cole. Baxley, MD   CHIEF COMPLAINT:  Chest pain and irregular pulse.   HISTORY OF PRESENT ILLNESS:  This a.m. at approximately 7:30, the  patient began to experience chest pain/pressure which was 3/10 without  any associated symptoms that came on at rest and not significantly  different from his usual chest pain.  Per the usual, the patient took  nitroglycerin 1 tablet 5 minutes apart, a total of 3 today and the pain  resolved.  The patient would have been unconcerned with these symptoms,  however, when he checked his pulse, right after his last nitroglycerin  he noted it was irregular.  The patient reports missed beats on pulse,  but no sensation of palpitations.  The patient became alarmed, discussed  the situation with his wife and they decided to call EMS.  The patient  was subsequently evaluated in the ED and not found to be in any  arrhythmia other than PACs and PVCs.  The patient denies shortness of  breath, palpitations, dizziness, nausea, vomiting, fevers, chills, or  any other complaints besides what was already discussed.   PAST MEDICAL HISTORY:  1. Coronary artery disease with his last cardiac catheterization in      August 2007 that showed a diagonal with 80% stenosis and a second      diagonal totally occluded filling from collaterals.  Two stents in      the proximal LAD which were widely patent.  Anteroapical wall      motion abnormality, severe and large hypokinesis of anterolateral  walls with akinesis of the apex.  2. Ischemic cardiomyopathy with an ejection fraction of approximately      30%.  3. Hypertension.  4. Hyperlipidemia.  5. Status post implantable cardioverter - defibrillator  6. GERD.  7. DJD (chronic back pain).  8. Hypothyroidism.   SOCIAL HISTORY:  The patient lives in [place] with his wife and son.  He is retired.  He has no smoking history.  He has never drank nor has  he used illicit drugs.  He does not currently use any herbal medication.  He follows a low-sodium, low-cholesterol diet.  He exercises regularly  15-30 minutes, walks 3-5 times per week.   FAMILY HISTORY:  Noncontributory due to the patient's age.   REVIEW OF SYSTEMS:  As described in the HPI.  All other systems were  reviewed and were negative.   ALLERGIES:  NKDA.   MEDICATIONS:  1. Prevacid 30 mg p.o. daily.  2. Spironolactone 12.5 mg p.o. daily.  3. Aspirin 325 mg p.o. daily.  4. Plavix 75 mg  p.o. daily.  5. Carvedilol 6.25 mg p.o. b.i.d.  6. Levothyroxine 50 mcg p.o. daily.  7. Vytorin 10/40 mg p.o. daily.  8. Ramipril 2.5 mg p.o. daily.   PHYSICAL EXAMINATION:  VITAL SIGNS:  Temperature 97.0 degrees  Fahrenheit, BP 134/72, pulse 60, respiration rate 18, and O2 saturation  95% on 2 liters by nasal cannula.  GENERAL:  The patient was alert and oriented x3, in no apparent  distress, moved and spoke easily without respiratory distress, however,  he did wince while sitting up during exam.  HEENT:  Head was normocephalic and atraumatic.  Pupils were equal,  round, and reactive to light.  Extraocular muscles were intact.  Nares  were patent without discharge.  Oropharynx was without erythema or  exudates.  NECK:  Supple without lymphadenopathy.  No thyromegaly and no JVD.  HEART:  Rate was regular with an audible S1 and S2.  No clicks, rubs,  murmurs, or gallops.  Pulses 2+ and equal on both upper and lower  extremities bilaterally.  LUNGS:  Clear to auscultation  bilaterally with good air movement.  SKIN:  No rashes, lesions, or petechiae.  ABDOMEN:  Soft and nontender.  Normal abdominal bowel sounds.  No  rebound or guarding.  No hepatosplenomegaly and no pulsations.  EXTREMITIES:  No clubbing, cyanosis, or edema.  MUSCULOSKELETAL:  No joint deformity or effusions and no spinal or CVA  tenderness.  NEURO:  Cranial nerves II-XII were grossly intact.  Strength was 5/5 in  all extremities and axial groups with normal sensation throughout and  normal cerebellar function.   RADIOLOGY:  1. Chest x-ray showed no acute disease, is stable compared to prior      exam.  Last echocardiogram performed in July 2005 showed an      ejection fraction of 25-35%, aneurysmal deformity of entire      periapical wall, akinesis with scarring of mid distal inferior      septal wall.  The aortic valve with mild calcification and mitral      valve with mild annular calcification.  2. EKG showed sinus bradycardia with a rate of 58.  No acute ST-T wave      changes compared to previous EKG of August 21, 2007.  The      patient has chronic T-wave inversion in V1-V3.  No Q-waves.  Normal      axis.  No evidence of hypertrophy.  PR 176, QRS 98, QTc 393.   LABORATORY:  WBC 6.5, HGB 13.1, HCT 38.7, and PLT count 152.  Sodium  138, potassium 4.4, chloride 102, CO2 of 31, BUN 14, creatinine 0.89,  and glucose 96.  First set of cardiac markers negative with CK-MB less  than 1.0 and troponin I less than 0.05.  AST is 19, ALT is 15, total  protein is 5.9, albumin is 3.48, aPTT 33, PT 13.8, and INR 1.0.  The  patient had a urinalysis that was within normal limits.   This is a 77 year old male with a history of known coronary artery  disease, status post large an old anteroapical and inferior wall infarct  with 2 stents in the LAD, ICM, hypertension, hyperlipidemia, and status  post ICD placement, presenting with no change to his unstable anginal  symptoms along with an  irregular pulse today.   1. Unstable angina.  We will continue to cycle his cardiac enzymes.      We will admit him to telemetry and plan for cath tomorrow.  Continue his home medications.  One of the reasons for planning      catheterization is the patient is planning for upcoming back      surgery for which Plavix must be discontinued and this      catheterization is partly to ensure that the patient has no new      lesions in the setting of planning for his back surgery and need      for discontinuation of Plavix.  The patient will also be switched      while in the hospital from Prevacid to Protonix as this is less      likely to interfere with Plavix function.  2. Irregular pulse, most likely related to PACs and PVCs as viewed on      monitor.  The patient already has an appointment to have his device      interrogated as well as to see his primary cardiologist on January 16, 2009, without any new information on telemetry.  We will      encourage the patient to keep this appointment.      Jarrett Ables, PAC      Peter C. Eden Emms, MD, [facility]  Electronically Signed    MS/MEDQ  D:  01/01/2009  T:  01/02/2009  Job:  [id number]

## 2011-04-07 NOTE — Assessment & Plan Note (Signed)
[facility]                            CARDIOLOGY OFFICE NOTE   Corey, Jordan                      MRN:          [id number]  DATE:09/16/2007                            DOB:          1934/09/05    Corey Jordan returns today for problems with dizziness and with static  hypotension.   We decreased his Altace to 2.5 mg a day.  He has been checking his blood  pressures several times a day.  In the mornings they are running about  130/80, in the evenings 95-10 over about 60.  He brings in multiple  readings today.   He feels remarkably better with a lot less dizziness.   He denies any orthopnea, PND, or peripheral edema.  He has had really no  postural dizziness at present.   MEDICATIONS:  Coreg 6.25 b.i.d., Plavix 75 mg daily, aspirin 325 mg  daily, spironolactone 12.5 mg daily, Altace 2.5 mg daily, Prevacid 30 mg  daily.   VITAL SIGNS:  His blood pressure today is 150/84, his pulse 60 and  regular, his weight is 153.  HEENT:  Normocephalic, atraumatic.  PERLA. Extraocular movements are  intact.  Sclerae are clear. Facial symmetry is normal.  NECK:  Carotid upstrokes are equal bilaterally without bruits, there is  no JVD, thyroid is not enlarged, trachea is midline.  LUNGS:  Clear.  HEART:  Reveals no gallop. PMI is slightly displaced.  ABDOMEN:  Soft with good bowel sounds, no midline bruit, there is no  hepatomegaly.  EXTREMITIES:  No clubbing, cyanosis or edema. Pulses are intact.  NEURO EXAM:  Intact.   Corey Jordan is doing much better with a lower dose of Altace as well as  Coreg.  I have made no changes in his program. We will plan on seeing  him back again in December for defibrillator check.  I will see him back  as well in 3 months for followup.  We will do objective assessment of  his coronaries in June, 2009.     Thomas C. Daleen Squibb, MD, [facility]  Electronically Signed    TCW/MedQ  DD: 09/16/2007  DT: 09/17/2007  Job #:  [id number]   cc:   Quita Skye. Artis Flock, M.D.

## 2011-04-07 NOTE — Cardiovascular Report (Signed)
NAME:  EARLIE, SCHANK NO.:  [id number]   MEDICAL RECORD NO.:  [id number]          PATIENT TYPE:  INP   LOCATION:  2023                         FACILITY:  [facility]   PHYSICIAN:  Salvadore Farber, MD  DATE OF BIRTH:  1934/09/13   DATE OF PROCEDURE:  05/13/2007  DATE OF DISCHARGE:                            CARDIAC CATHETERIZATION   PROCEDURE:  Left heart catheterization, coronary angiography, AngioSeal  closure of the right common femoral arteriotomy site.   INDICATIONS:  Mr. Shin is a 77 year old gentleman who suffered  anterior myocardial infarction in November 2004 treated with Cypher drug-  eluting stent to the mid-LAD.  He is presented with recurrent chest pain  on several occasions since then.  Most recent cardiac catheterization  was in August 2007.  He is now admitted with an episode of mild chest  pain.  This lasted approximately an hour and associated with some  nausea.  He ruled out for myocardial infarction by serial enzymes and  electrocardiograms.  Electrocardiogram showed more prominent  anterolateral T-wave inversions and ST depressions prompting  recommendation for cardiac catheterization.   PROCEDURAL TECHNIQUE:  Informed consent was obtained.  Under 1%  lidocaine local anesthesia, a 5-French sheath was placed in the right  common femoral artery using modified Seldinger technique.  Diagnostic  angiography and ventriculography were performed using JL-4, JR-4, and  pigtail catheters.  The arteriotomy was then closed using an AngioSeal  device.  Complete hemostasis was obtained.  He was then transferred to  holding room in stable condition having tolerated the procedure well.   COMPLICATIONS:  None.   FINDINGS:  1. LV:  119/4/9.  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  There is no      aortic stenosis or mitral regurgitation.  2. Left main:  Angiographically normal.  3. LAD:  Moderate-sized vessel giving rise to a moderate-sized first      and  small second diagonal branch.  The previously placed stent in      the mid-LAD is widely patent with no in-stent restenosis.  There      are only minor luminal irregularities downstream from the stent.      The first diagonal branch has an ostial 80% stenosis which is      unchanged from prior.  4. Ramus intermedius:  Small vessel which is angiographically normal.  5. Circumflex:  Large codominant vessel giving rise to two obtuse      marginals and a PDA.  It is angiographically normal.  6. RCA:  Small codominant vessel.  It is angiographically normal. .   IMPRESSION/RECOMMENDATIONS:  There is been no change in his coronary  anatomy compared with August 2007.  I therefore suspect a noncardiac  etiology to the episode of chest pain that he suffered yesterday.  Will  discharge home.  If pain recurs will look further into other causes.      Salvadore Farber, MD  Electronically Signed     WED/MEDQ  D:  05/13/2007  T:  05/13/2007  Job:  [id number]

## 2011-04-07 NOTE — Assessment & Plan Note (Signed)
[facility]                         ELECTROPHYSIOLOGY OFFICE NOTE   JEVONTE, CLANTON                      MRN:          [id number]  DATE:06/30/2007                            DOB:          1934-09-24    Mr. Vink was seen in the clinic for followup of his Medtronic, model  [id number], date of implant was July 06, 2006, for ischemic cardiomyopathy.   On interrogation of his device today:  His battery voltage is 3.20 with a charge time of 7.59 seconds.  R waves measured 17.8 millivolts with a ventricular capture threshold of  1 volt at 0.2 milliseconds and a ventricular lead impedance of 704 ohms.  Shock impedance was 48.  Underlying rhythm today was a sinus bradycardia at 54 beats a minute.  No episodes since last interrogation.  No changes were made in his parameters.   He will be seen again in 3 months' time.      Altha Harm, LPN  Electronically Signed      Doylene Canning. Ladona Ridgel, MD  Electronically Signed   PO/MedQ  DD: 06/30/2007  DT: 06/30/2007  Job #: [id number]

## 2011-04-07 NOTE — Discharge Summary (Signed)
NAME:  Corey Jordan, Corey Jordan NO.:  [id number]   MEDICAL RECORD NO.:  [id number]          PATIENT TYPE:  OBV   LOCATION:  3737                         FACILITY:  [facility]   PHYSICIAN:  Verne Carrow, MDDATE OF BIRTH:  02-27-1934   DATE OF ADMISSION:  01/01/2009  DATE OF DISCHARGE:  01/02/2009                               DISCHARGE SUMMARY   PRIMARY CARDIOLOGIST:  Maisie Fus C. Daleen Squibb, MD, [facility]   ELECTROPHYSIOLOGIST:  Doylene Canning. Ladona Ridgel, MD   NEUROSURGEON:  Payton Doughty, MD   PRIMARY CARE PHYSICIAN:  Luanna Cole. Lenord Fellers, MD   DISCHARGE DIAGNOSES:  1. Coronary artery disease with stable angina.  2. Irregular heart rate secondary to premature atrial contractions and      premature ventricular contractions.   SECONDARY DIAGNOSES:  1. Ischemic cardiomyopathy with an ejection fraction approximately 30-      40%  2. Hypertension.  3. Hyperlipidemia.  4. Hypothyroidism.  5. Gastroesophageal reflux disease.  6. Status post implantable cardioverter - defibrillator.  7. Degenerative joint disease (with chronic back pain).   ALLERGIES:  NKDA.   PROCEDURES PERFORMED DURING THIS HOSPITALIZATION:  1. Electrocardiogram.  EKG performed on January 01, 2009, that showed      a sinus bradycardia with a rate of 58, T-wave inversion in leads V1      through V3 as well as in lead aVL.  No other significant ST-T-wave      changes, but no significant difference from EKG performed on      August 21, 2007.  The patient also had EKG performed on January 02, 2009, with the only significant difference being evidence of      premature atrial complexes with aberrant conduction.  2. The patient had chest x-ray on January 01, 2009, that showed no      acute disease and stable compared to prior exam.  On January 02, 2009, the patient had a cardiac catheterization that showed stable      single-vessel coronary artery disease with patent stented segment      in the mid left anterior  descending artery and also segmental LV      dysfunction, which was deemed stable.  His ejection fraction was      estimated to be 40% with a large apical aneurysm.  3. The patient had ICD eval on January 02, 2009, that showed no      evidence of nonsustained or sustained high-rate episode.  No      changes were made to the system.   BRIEF HISTORY OF PRESENT ILLNESS:  This is a 77 year old male with a  known history of coronary artery disease and ischemic cardiomyopathy  along with hypertension and hyperlipidemia, presenting with chest  pain/pressure that began early in the morning on the day of admission.  Pain was approximately 3/10 at worse with no associated symptoms and  came on at rest and was not significantly different from the chest pain  the patient has experienced over the last several years.  His usual pain  responded to nitroglycerin.  After the third nitroglycerin, the patient  felt symptoms had resolved.  However, as he often does, he checked his  pulse and he noted it was irregular.  The patient states that certain  beats seemed to skipped or fast beats with a short pause.  The patient  was alarmed and discussed his regular pulses with his wife, and they  decided to call EMS due to his significant cardiac history.  However,  the patient was asymptomatic at the time.  Upon eval in the hospital,  the patient denied any symptoms nor did he noted irregular heartbeat  during his eval in the ED.  At no time, did the patient experienced  shortness of breath, palpitations, dizziness, nausea, vomiting, fevers  or chills, or any other complaints other than the one, which is already  described.  The patient will be undergoing a steroid injection to his  back for which must come off his Plavix, and he may also have the need  for back surgery in the near future for which he would also have to come  off his Plavix given his history and current symptoms.  Cardiac  catheterization was  planned for January 02, 2009.   HOSPITAL COURSE:  The patient was admitted to telemetry, observed  overnight without incident and underwent cardiac catheterization on  January 02, 2009.  He tolerated the procedures well without significant  complications.  Results as described above.   PLAN:  Post cath for continued medical management and should he need to  come off his Plavix for a short period of time for his plan of steroid  injection that would be acceptable to Cardiology given the remote  history of stenting and his stable coronary artery disease.  After  cardiac catheterization, the patient underwent ICD eval with no  significant findings, no changes to the ICD.  This information was  reported to Dr. Clifton James.  The patient will follow up with his regular  scheduled appointment with his primary cardiologist in 2 weeks and also  will move for with his already scheduled appointment with neurosurgeon  for steroid injection to his back.  The patient was given written and  oral post cath instructions as well as his medication list, which was  unchanged at the time of discharge.  The patient had no questions or  concerns that were not addressed.  The patient's vital signs had  remained stable during his hospital course.  During his entire hospital  course and latest vital signs on day of discharge, temperature 97.8  degrees Fahrenheit, BP 114/62, pulse 76, respiration rate 22, and O2  saturation 96% on room air.   DISCHARGE LABORATORY DATA:  WBC 5.3, HGB 12.6, HCT 36.5, PLT count 124.  The patient had 3 sets of negative point-of-care cardiac markers and 2  sets of negative full cardiac markers.  The TSH was 1.741.  Urinalysis  all values within normal limits.  On January 01, 2009, sodium 138,  potassium 4.4, chloride 102, CO2 31, BUN 14, creatinine 0.89, glucose  96, total bilirubin 0.9, alkaline phosphatase 56, AST 19, ALT 15, total  protein 5.9, albumin 3.4, calcium 9.3, magnesium  2.3.   FOLLOWUP PLANS AND APPOINTMENTS:  The patient has an appointment with  his primary cardiologist on January 16, 2009, it was previously  scheduled with no change.  The patient has an appointment with his  neurosurgeon, Dr. Channing Mutters previously scheduled, but no need for change from  a cardiology perspective.   DISCHARGE MEDICATIONS:  1. Protonix 40 mg p.o. daily.  2. Spironolactone 12.5 mg p.o. daily.  3. Aspirin 325 mg p.o. daily.  4. Plavix 75 mg p.o. daily.  5. Carvedilol 6.5 mg p.o. b.i.d.  6. Levothyroxine 50 mcg p.o. daily.  7. Vytorin 10/40 mg p.o. daily.  8. Ramipril 2.5 mg p.o. daily.   DURATION OF DISCHARGE ENCOUNTER:  Physician time was 40 minutes.       Jarrett Ables, [facility]      Verne Carrow, MD  Electronically Signed    MS/MEDQ  D:  01/02/2009  T:  01/03/2009  Job:  [id number]   cc:   Jesse Sans. Daleen Squibb, MD, [facility]  Doylene Canning. Ladona Ridgel, MD  Payton Doughty, M.D.  Luanna Cole. Lenord Fellers, M.D.

## 2011-04-07 NOTE — Assessment & Plan Note (Signed)
[facility]                            CARDIOLOGY OFFICE NOTE   Corey Jordan, Corey Jordan                      MRN:          [id number]  DATE:06/06/2008                            DOB:          1934/05/26    Mr. Gaertner comes in today for followup.  Other than just fatigue and  some shortness of breath in the hot humid air, he is doing well.   PROBLEM LIST:  1. Coronary artery disease.  He has an ejection fraction of 25-35%,      status post large anterior, apical, and inferior wall infarct.  2. History of orthostatic hypotension.  3. History of hypertension.  4. Hyperlipidemia.  He just had a complete physical with Dr. Artis Flock and      he said his labs were good.  5. Status post automatic implantable cardioverter-defibrillator.   We interrogated his device today.  There has been no firing.  It is  appropriately functioning.   He denies orthopnea.  In fact, he sleeps on 1 pillow.  He has had no  peripheral edema.   He brings in a whole list of blood pressures today, which were good.   CURRENT MEDICATIONS:  1. Prevacid 30 mg a day.  2. Spironolactone 12.5 mg daily.  3. Aspirin 325 mg a day.  4. Plavix 75 mg a day.  5. Carvedilol 6.25 mg b.i.d.  6. Levothyroxine 50 mcg a day.  7. Vytorin 10/40 mg daily.  8. Ramipril 2.5 mg a day.   PHYSICAL EXAMINATION:  VITAL SIGNS:  His blood pressure is 148/75, his  pulse is 56 and regular, and weight is 152 which is stable.  HEENT:  Normocephalic and atraumatic.  PERRLA.  Extraocular movements  intact.  Sclerae are clear.  Facial symmetry is normal.  NECK:  Carotid upstrokes are equal bilaterally without bruits.  Thyroid  is not enlarged.  Trachea is midline.  LUNGS:  Clear.  HEART:  Regular rate and rhythm.  No gallop.  His defibrillator is  intact and stable.  ABDOMEN:  Soft.  Good bowel sounds.  No midline bruits.  EXTREMITIES:  No cyanosis, clubbing, or edema.  Pulses are intact.  NEUROLOGIC:   Intact.   Mr. Beale is doing remarkably well.  I have asked him to stay on his  current medications.  We renewed his prescriptions.  We will see him  back again in 6 months.     Thomas C. Daleen Squibb, MD, [facility]  Electronically Signed    TCW/MedQ  DD: 06/06/2008  DT: 06/07/2008  Job #: [id number]   cc:   Quita Skye. Artis Flock, M.D.

## 2011-04-07 NOTE — Assessment & Plan Note (Signed)
[facility]                            CARDIOLOGY OFFICE NOTE   JONAS, GOH                      MRN:          [id number]  DATE:12/14/2007                            DOB:          [birth year]    [name] comes in today for close followup of his blood pressure and  dizziness.   PROBLEM LIST:  1. Coronary artery disease.  2. Left ventricular systolic dysfunction.  3. History of orthostatic hypotension.  4. Hyperlipidemia.   We last saw him on September 16, 2007.  He was doing much better with  lower dose Altace as well as his Coreg.   He says he feels the best he has felt in some time.  Dr. Artis Flock recently  found out his thyroid was low and placed him on levothyroxine 50 mcg a  day.  He is also on Coreg 6.25 b.i.d., Plavix 75 mg a day, aspirin 325  mg a day, spironolactone 12.5 mg a day, Altace 2.5 mg a day and Prevacid  30 mg a day.   His blood pressure is 133/70, pulse 64 and regular.  His EKG shows sinus  rhythm with his anterior changes which are stable.  He brings in blood  pressures that he checks twice a day.  His average is about 120 over  about 70.  He has a few outliers but I reassured him these were fine.   PHYSICAL EXAMINATION:  GENERAL:  He is in no acute distress.  HEENT:  Unchanged.  NECK:  Carotid upstrokes are equal bilaterally without bruits, no JVD.  Thyroid is not enlarged.  Trachea is midline.  LUNGS:  Clear.  HEART:  Reveals a nondisplaced PMI.  There is no gallop.  ABDOMEN:  Soft, good bowel sounds.  EXTREMITIES:  No edema.  Pulses are intact.   [name] is doing well.  I have made no changes.  I will see him  back again in June/2009 which will be a year out from his cardiac  catheterization.     Thomas C. Daleen Squibb, MD, [facility]  Electronically Signed    TCW/MedQ  DD: 12/14/2007  DT: 12/14/2007  Job #: [id number]   cc:   Quita Skye. Artis Flock, M.D.

## 2011-04-07 NOTE — Assessment & Plan Note (Signed)
[facility]                                 ON-CALL NOTE   CARMIN, DIBARTOLO                        MRN:          [id number]  DATE:08/20/2007                            DOB:          Apr 10, 1934    PRIMARY CARDIOLOGIST:  Jesse Sans. Wall, MD, [facility].   Mr. Witter called because he was having some issues with weakness.  He  stated that he had an episode where he had walked faster than usual into  a store and when he got there he had a near syncopal episode and had to  sit down.  The episode resolved.  He did not have any chest pain.  He  felt slightly short of breath with it.  He stated this was 3 days ago.  Since then, he has had some weakness and feels soft of dazed.  His  blood pressure has been on the low side of normal but in general within  normal limits, except for one reading that was 89 systolic.  During that  reading his symptoms were not any worse or any better than when his  systolic blood pressure was 110.   I discussed the situation with Mr. Geisel and his wife.  I stated that  if they wished to be evaluated, they would have to go to the emergency  room.  I requested that he do that and that he call 911.  He stated he  did not wish to call 911 but would not drive.      Theodore Demark, PA-C  Electronically Signed      Doylene Canning. Ladona Ridgel, MD  Electronically Signed   RB/MedQ  DD: 08/21/2007  DT: 08/21/2007  Job #: [id number]

## 2011-04-07 NOTE — Consult Note (Signed)
NAME:  Corey Jordan, Corey Jordan NO.:  [id number]   MEDICAL RECORD NO.:  [id number]          PATIENT TYPE:  EMS   LOCATION:  MAJO                         FACILITY:  [facility]   PHYSICIAN:  Lowella Bandy, MD      DATE OF BIRTH:  November 21, 1934   DATE OF CONSULTATION:  08/21/2007  DATE OF DISCHARGE:                                 CONSULTATION   STAT CONSULTATION REPORT   REQUESTING PHYSICIAN:  Carleene Cooper, M.D. - [facility]   PRIMARY CARDIOLOGIST:  Jesse Sans. Daleen Squibb, MD, [facility]   PRIMARY CARE Caralina Nop:  Dr. Hulan Saas   REASON FOR CONSULTATION:  Dizziness.   HISTORY OF PRESENT ILLNESS:  Mr. Hufford is a very pleasant 77 year old  white male with a history of coronary artery disease and previous  myocardial infarction (MI) and ischemic cardiomyopathy, status post  single chamber ICD placement for primary prophylaxis against sudden  cardiac death, who reports that he had an episode 3 days ago of  dizziness and lightheadedness during which he nearly passed out.  He  denies frank syncope.  He reports that he was walking into the beauty  shop with his wife when he felt lightheaded and felt like his  equilibrium was off to some degree.  He sat down and gradually his  symptoms improved.  Over the last couple of days, he has not had any  further episodes of near-syncope, but does have some persistent  dizziness.  He denies any ICD shocks and has never had any that he is  aware of.  He denies any known palpitations, focal weakness or numbness  or speech difficulties.  He and his wife keep a meticulous record of his  blood pressures at home, and have noted that some of his blood pressure  readings, particularly in the evenings, have been running somewhat low,  occasionally with systolic blood pressures in the high 80s.  His blood  pressures in the morning consistently run in the 110s.   PAST MEDICAL HISTORY:  1. Coronary artery disease, status post previous MI  and stent to the      left anterior descending (LAD).  Last cardiac catheterization was      in June, 2008, which showed stent in the mid LAD widely patent with      no re stenosis.  He had an 80% ostial first diagonal lesion which      was unchanged from previous.  The remaining vessels were      essentially normal.  His ejection fraction at that time was 50%,      with a large apical aneurysm.  2. History of ischemic cardiomyopathy; ejection fraction previously in      the range of 35%, most recently improved to 50% on cardiac      catheterization June, 2008.  Has had a Medtronic ICD placed, with      no discharges.  3. Hyperlipidemia.  4. History of gastroesophageal reflux disease.  5. Degenerative joint disease.   MEDICATIONS:  1. Aspirin 325 mg p.o. once a day.  2. Plavix 75 mg  p.o. once a day.  3. Altace 2.5 mg p.o. b.i.d. (was decreased in June by Dr. Daleen Squibb).  4. Coreg 12.5 mg p.o. b.i.d.  5. Vytorin 10/40 one tablet once a day.  6. Prevacid 30 mg p.o. once a day.   SOCIAL HISTORY:  Patient is a nonsmoker and denies alcohol use.  He is  retired from the [employer].   FAMILY HISTORY:  No first-degree relatives with premature coronary  disease.  His mother died in her 30s from pneumonia.  His father died in  his 11s.   REVIEW OF SYSTEMS:  Ten systems reviewed and negative, other than as  noted above in the HPI.   PHYSICAL EXAMINATION:  VITAL SIGNS:  Blood pressure 109/64, pulse 59.  GENERAL:  Patient a well-appearing white male in no apparent distress.  HEENT:  Normocephalic/atraumatic.  Sclerae anicteric.  No icterus,  extraocular movements intact, no nystagmus.  NECK:  Supple, no carotid bruits or jugular venous distention.  CARDIOVASCULAR:  Regular rate and rhythm, no murmurs, rubs or gallops.  CHEST:  Clear to auscultation bilaterally.  ABDOMEN:  Soft, nontender/nondistended, normal bowel sounds.  EXTREMITIES:  Warm, no edema.  NEUROLOGIC:  Cranial nerves II-XII  intact, moving all extremities well.  PSYCHIATRIC:  Alert and oriented x3, affect pleasant and appropriate.  SKIN:  No rashes.  MUSCULOSKELETAL:  No joint effusions or erythema.   EKG:  EKG reviewed shows sinus bradycardia at 56 beats per minute with  an anterior T-wave abnormality, which is unchanged from prior.   CT OF THE HEAD:  Preliminary report negative for any acute intracranial  abnormality.   LABORATORY VALUES:  Reviewed:  Troponin less than 0.05, CK-MB 1.2,  hemoglobin 13.9, hematocrit 41.5, white blood cell count 5.4, platelets  177, BNP 128, sodium 139, potassium 4.4, chloride 104, BUN 13,  creatinine 1.2, glucose 122.   ICD INTERROGATION:  ICD interrogation shows no episodes of arrhythmias  recorded.   ASSESSMENT:  [age]-year-old male with history of coronary disease  and ischemic cardiomyopathy status post ICD placement, who presents with  nonspecific dizziness and a near syncopal episode 3 days ago.  His  neurologic exam and head CT are normal.  He does not have any evidence  of ventricular dysrhythmias or any other concerning cardiac findings at  this time to indicate a cardiac abnormality to explain his symptoms.  His home log of blood pressures does indicate that his blood pressure  sometimes in the evenings dips into the 80s systolic, and it is possible  that his symptoms are somewhat orthostatic in nature.  He feels well  presently, and otherwise appears stable for discharge.   PLAN:  1. We will decrease his carvedilol to 6.25 mg p.o. b.i.d. because of      his evening hypotension to see if this helps improve his symptoms.      Otherwise, we instructed him to continue his current medical      regimen.  2. He will see Dr. Daleen Squibb and follow up in the near future for any      further titration of his medications.  3. If his symptoms worsen or if he experiences syncope, he will come      back to the emergency department.  4. If his dizziness persists at a  low level without any syncopal      events, this may be unrelated to any cardiac      etiology and he may need to follow up with his primary care  Salvadore Valvano, Dr. Penni Bombard, for further workup of his dizziness.   The patient and his wife expressed understanding and agreement with the  plan.      Lowella Bandy, MD  Electronically Signed     JJC/MEDQ  D:  08/21/2007  T:  08/21/2007  Job:  [id number]

## 2011-04-07 NOTE — Cardiovascular Report (Signed)
NAME:  Corey Jordan, Corey Jordan NO.:  [id number]   MEDICAL RECORD NO.:  [id number]          PATIENT TYPE:  OBV   LOCATION:  3737                         FACILITY:  [facility]   PHYSICIAN:  Verne Carrow, MDDATE OF BIRTH:  08-29-1934   DATE OF PROCEDURE:  01/02/2009  DATE OF DISCHARGE:  01/02/2009                            CARDIAC CATHETERIZATION   PROCEDURE PERFORMED:  1. Left heart catheterization.  2. Selective coronary angiography.  3. Left ventricular angiogram.   OPERATOR:  Verne Carrow, MD   INDICATION:  Chest pain in a patient with known coronary artery disease  and an ischemic cardiomyopathy.   DETAILS OF PROCEDURE:  The patient was brought to the Inpatient Cardiac  Catheterization Laboratory after signing informed consent for the  procedure.  The right groin was prepped and draped in a sterile fashion.  Lidocaine 1% was used for local anesthesia.  A 5-French sheath was  inserted into the right femoral artery using the modified Seldinger  technique.  Standard diagnostic catheters were used to perform selective  coronary angiography.  A 5-French pigtail catheter was used to cross the  aortic valve into the left ventricle.  Following performance of the left  ventricular angiogram, the catheter was pulled back across the aortic  valve with no significant pressure gradient measured.  The patient  tolerated the procedure well and was taken to the holding area in stable  condition.   ANGIOGRAPHIC FINDINGS:  1. The left main coronary artery is angiographically normal.  2. The left anterior descending is a moderate-sized vessel that      courses to the apex and gives off several diagonal branches.  There      is a stent in the proximal to mid vessel that is widely patent with      no evidence of restenosis.  There are minor luminal irregularities      beyond the stent in the mid and distal LAD.  The first diagonal      branch has an ostial 90% stenosis.   This is a small-caliber vessel      and it is unchanged in appearance from prior catheterization.  The      second diagonal is fairly  occluded at the ostium and fills late      from left collaterals.  This is also unchanged from the prior      catheterization.  3. Circumflex is a large codominant vessel that gives rise to two      small obtuse marginal branches and a moderate-sized posterior      descending artery.  There is no angiographic evidence of disease in      the circumflex system.  4. The right coronary artery is a moderate size codominant vessel that      gives rise to a small posterior descending artery.  There are      luminal irregularities in the proximal and midportion of the      vessel.  5. Left ventricular angiogram shows a large apical aneurysm with      moderate left ventricular systolic dysfunction.  Ejection fraction  is estimated at 40%.  This is unchanged from prior left ventricular      angiogram.   HEMODYNAMIC DATA:  Central aortic pressure 137/65, left ventricular  pressure 139/7, end-diastolic pressure 13.   IMPRESSION:  1. Stable single-vessel coronary artery disease with patent stented      segment in the proximal to mid left anterior descending.  2. Segmental left ventricular dysfunction that is stable in      appearance.   RECOMMENDATIONS:  I recommend continued medical management of this  patient's coronary artery disease.  The patient will have his  defibrillator interrogated in the holding area and then will be  discharged to home later today.  He has followup scheduled already with  Dr. Juanito Doom on January 16, 2009.      Verne Carrow, MD  Electronically Signed     CM/MEDQ  D:  01/02/2009  T:  01/02/2009  Job:  [id number]   cc:   Jesse Sans. Wall, MD, [facility]

## 2011-04-07 NOTE — Assessment & Plan Note (Signed)
[facility]                            CARDIOLOGY OFFICE NOTE   Corey Jordan, Corey Jordan                      MRN:          [id number]  DATE:08/23/2007                            DOB:          12-Dec-1933    Corey Jordan comes in today because of persistent dizziness.  Sometimes  it is postural, sometimes it is spontaneous.   He was evaluated extensively in the emergency department for this same  complaint on August 21, 2007.  Dr. Nevin Bloodgood was on call for [name].   At that time, he was noted to have blood pressures in the low 80s in the  afternoons.  His blood work was unremarkable.  CT scan was negative.  ICD interrogation showed no episodes of arrhythmias.  Dr. Nevin Bloodgood  decreased his carvedilol to 6.25 b.i.d. from 12.5 b.i.d.  It has really  not changed things.   He had a similar problem with this in the office in the past.  We  decreased his Altace at that time, which helped.   He denies any chest pain, orthopnea, PND.  He brings in numerous blood  pressures today, which are actually pretty good except for a few in the  systolic 80s range.   MEDICATIONS:  1. Altace 2.5 b.i.d.  2. Spironolactone 12.5 mg a day.  3. Aspirin 325 mg a day.  4. Plavix 75 mg a day.  5. Coreg 6.25 b.i.d.  6. Prevacid 30 mg a day.   PHYSICAL EXAMINATION:  Blood pressure 157/74 lying down, heart rate 54.  Sitting up, he dropped to 147/72 and became dizzy.  He subsequently came  back up to 160/78 with his heart rate staying in the high 50s.  His EKG  today shows sinus brady at a rate of 58.  There are no other changes.  He has T wave changes across the anterior precordium, which are old.   Corey Jordan continues to suffer from episodes of dizziness.  It seems  to be most likely orthostatic, related to his blood pressure.   PLAN:  1. Decrease Altace to 2.5 mg a day.  2. Check blood pressures between now and the next couple of weeks.  3. Follow up with me at that time.  We  may have to cut his Coreg back      further, but the      therapeutic doses and clinical trials have been 6.25 b.i.d. and not      lower.  Prescriptions were called in and handled, and all questions      answered.     Thomas C. Daleen Squibb, MD, [facility]  Electronically Signed    TCW/MedQ  DD: 08/23/2007  DT: 08/23/2007  Job #: [id number]

## 2011-04-07 NOTE — Assessment & Plan Note (Signed)
[facility]                            CARDIOLOGY OFFICE NOTE   Corey, Jordan                      MRN:          [id number]  DATE:01/16/2009                            DOB:          1934-06-05    Corey Jordan comes in after being hospitalized with palpitations and  chest pain on January 02, 2009.   Underwent cardiac cath which showed stable anatomy.  Please refer to the  catheterization report.   He also had his defibrillator checked which showed no discharges.  He  does have some PACs and PVCs.   Denies orthopnea, PND, peripheral edema.  His blood pressure has been  under good control and he brings in many readings today for me to  peruse.   His meds are unchanged since his discharge summary.  Please refer to  that.   PHYSICAL EXAMINATION:  VITAL SIGNS:  Blood pressure 138/80, pulse 57 and  regular, weight is 148 down 4.  HEENT:  Normal.  NECK:  Supple.  No JVD.  Carotids are full without bruits.  Thyroid is  not enlarged.  Trachea is midline.  LUNGS:  Clear to auscultation and percussion.  HEART:  An irregular rate and rhythm.  No gallop.  ABDOMEN:  Soft.  Good bowel sounds.  No midline bruit.  No hepatomegaly.  EXTREMITIES:  No cyanosis, clubbing, or edema.  Pulses are intact.  NEURO:  Intact.   Mr. Current is doing well.  I made no changes in his medical program.  We renewed some of his medications for him.  I will see him back again  in 6 months.     Thomas C. Daleen Squibb, MD, [facility]  Electronically Signed    TCW/MedQ  DD: 01/16/2009  DT: 01/16/2009  Job #: [id number]

## 2011-04-07 NOTE — H&P (Signed)
NAME:  Corey Jordan, HINCH NO.:  [id number]   MEDICAL RECORD NO.:  [id number]          PATIENT TYPE:  INP   LOCATION:  1844                         FACILITY:  [facility]   PHYSICIAN:  Thomas C. Wall, MD, FACCDATE OF BIRTH:  [birth year]   DATE OF ADMISSION:  05/12/2007  DATE OF DISCHARGE:                              HISTORY & PHYSICAL   CODE STATUS:  FULL CODE.   PRIMARY CARE PHYSICIAN:  Bradd Canary, M.D.   TOTAL VISIT TIME:  Approximately 43 minutes. The patient is a good  historian, as well as a wife. They were good historian's.   CHIEF COMPLAINT:  Chest pain.   HISTORY OF PRESENT ILLNESS:  Corey Jordan is a 77 year old male with a  history of coronary artery disease status post MI in November of 2004,  of the anterior wall s/p stents of the LAD x2, Cypher stents both, with  ejection fraction in the range of 35%. He has history of a stress  Myoview in December of 2006 with ejection fraction of 37% with no  evidence of inducible ischemia but evidence of a lateral T wave apical  scar, who presents with chest pain. The patient notes very mild episodes  that have been transient of chest pain, since his myocardial infarction  in 2004, which has always been relieved by nitroglycerin but notes over  the last 2 to 3 months, the chest pain has been more often and a little  bit more severe. The pain, he notes, the frequency is on an average of 1  every 3 weeks, that is relieved with 1 to 2 and sometimes 3  nitroglycerin. The patient notes the pain occurs at rest or with stress.  The patient notes that the morning of admsision, at 5:30 after he woke  up, slight chest pain, tightness across his anterior chest bilaterally,  with no radiation, but noticed the pain subsiding on its own, was very  mild. He did not take a nitroglycerin at the time. The patient notes,  however, approximately 8:30 a.m., while attending his granddaughter's  kindergarten graduation, worse chest pain in  the range of 2 to 3 out of  10, requiring him to take 3 sublingual nitroglycerin's after leaving the  event. The patient notes feeling flushed after and has associated nausea  and headache, which the pain lasted for about an hour. He had no  associated sweats or light headedness. The patient notes he walked for  about 20 minutes today, had lunch, and approximately 2:00 p.m., after  lunch, he had recurrence of the chest pain. Took 3 sublingual  nitroglycerin's but this did not relieve the pain. The pain at that time  was 2 out of 3 out of 10 and again, similar. Described as a tightness  with no specific radiation and no associated shortness of breath,  nausea, or sweats. The chest pain was at rest. The patient notes that  his wife drove him to the emergency room at approximately 16:47 p.m. He  arrived to the ER and the patient was given morphine, aspirin full dose,  and he was also given  IV nitroglycerin, which relieved the pain to 0 out  of 10 by around 1800 p.m. The patient denies any recent lower extremity  edema, paroxysmal nocturnal dyspnea, orthopnea, or palpitations. He  denies having the ICD fire, ever. The patient notes his blood pressure  has been okay.   PAST MEDICAL HISTORY:  1. As above concerning his coronary artery disease.  2. He has a history of hyperlipidemia.  3. Hypertension. Blood pressure has been labile in the past. Blood      pressure, he notes, in the range of 90's over 60's on average.  4. History of gastroesophageal reflux disease.  5. History of degenerative joint disease.  6. He is status post AICD placement in August 2007.   PAST SURGICAL HISTORY:  1. History is 3 hernia operations to the left groin.  2. History of back surgery, lumbar area.   MEDICATIONS:  1. Coreg 12.5 mg by mouth twice daily.  2. Spironolactone 12.5 mg by mouth daily.  3. Prevacid 30 mg by mouth daily.  4. Vytorin 10/40 mg by mouth daily.  5. Altace 10 mg by mouth twice daily.  6.  Aspirin 81 mg daily.   ALLERGIES:  NO KNOWN DRUG ALLERGIES.   SOCIAL HISTORY:  No history ever of tobacco, alcohol, or illicit drug  use. He is retired from [employer] for 31 years.   FAMILY HISTORY:  Positive for 2 cousins with coronary artery disease,  both have had myocardial infarctions. Mother passed away of pneumonia at  the age of 43. Father died at the age of 80 with possible peripheral  vascular disease, requiring lower extremity amputation. He also had  congestive heart failure. Brother with diabetes and a brother with  prostate cancer.   REVIEW OF SYSTEMS:  10 point review of systems negative including  dyspnea or fever. Denies any rashes. Denies any bright red blood per  rectum or any melanotic stools.   PHYSICAL EXAMINATION:  VITAL SIGNS:  Temperature 98 degrees with pulse  of 61, respiratory rate 14, blood pressure 145/82 with saturations of  96% on room air.  GENERAL:  A well developed male who looks younger than his stated age.  No acute distress.  HEENT:  Pupils are equal, round, and reactive to light. Extraocular  movements intact. Ears, nose, mouth, and throat shows no signs of  bogginess of the mucosa. External ears shows on signs of lesions, scars,  or masses. Oropharynx is dry.  NECK:  Supple with no lymphadenopathy or thyromegaly. There are no  carotid bruits or jugular venous distention.  CARDIOVASCULAR:  Examination as above. Also Regular rate and rhythm to  auscultation anterior chest with no signs or murmur, rub, or gallop.  EXTREMITIES:  Vascular-wise, his pulses are 2+ bilateral of the femoral  with no signs of bruits. Dorsalis pedis and posterior tibial 2+ and  symmetric. Also, the radials are 2+ and symmetric. No clubbing,  cyanosis, or edema.  ABDOMEN:  Soft, nontender, and nondistended with normal active bowel  sounds.  LUNGS:  Clear to auscultation bilaterally. NEUROLOGIC:  Cranial nerves 2-12 are grossly intact. Strength and  sensation  grossly intact.   LABORATORY DATA:  EKG showing sinus bradycardia at 69 with normal axis,  normal intervals, with T wave inversions 1, AVL, V2 to V5, which is  probably unchanged, compared to EKG July 06, 2006. He has 1 mm ST  elevations in V1 only, on the recent EKG.   His chest x-ray showed his ICD in  place in the left upper chest.  Otherwise, no evidence of acute cardiopulmonary disease.   Sodium 138, potassium 4.4, chloride 104, bicarb 28, BUN 16, glucose 97.  White count 6.1 with a hemoglobin of 13.9. Platelets of 176,000 with  hematocrit of 41. Cardiac markers at 1755, troponin less than 0.05 with  a myoglobin of 55.5. CK MB less than 1.0. INR 1.0, PTT 33, PT 13.3,  urinalysis negative. His creatinine is 1.2.   ASSESSMENT/PLAN:  This is a patient with a history of significant  coronary artery disease status post myocardial infarction, now with  possible unstable angina, which he will be admitted with diagnosis of  unstable angina. He will be given Lovenox and will be NPO after midnight  for possible coronary blood flow assessment. He will be continued on  aspirin and beta blocker, which will be his home dose of Coreg. Will  also continue his Statin and ace inhibitor. Will give a low drip of IV  fluids and will follow his cardiac markers and serial EKG's, as well as  get a lipid panel, to assess his recent lipid status. For DVT  prophylaxis, will be on treatment dose of Lovenox and GI prophylaxis  with Protonix.      Darryl D. Prime, MD   Electronically Signed     ______________________________  Jesse Sans. Daleen Squibb, MD, [facility]    DDP/MEDQ  D:  05/12/2007  T:  05/12/2007  Job:  [id number]   cc:   Thomas C. Daleen Squibb, MD, [facility]  Quita Skye. Artis Flock, M.D.

## 2011-04-07 NOTE — Discharge Summary (Signed)
NAME:  Corey Jordan, Corey Jordan NO.:  [id number]   MEDICAL RECORD NO.:  [id number]          PATIENT TYPE:  INP   LOCATION:  2023                         FACILITY:  [facility]   PHYSICIAN:  Salvadore Farber, MD  DATE OF BIRTH:  06-Jul-1934   DATE OF ADMISSION:  05/12/2007  DATE OF DISCHARGE:  05/13/2007                               DISCHARGE SUMMARY   PRIMARY CARDIOLOGIST:  Maisie Fus C. Wall, MD, [facility].   PRIMARY CARE PHYSICIAN:  Quita Skye. Artis Flock, M.D.   PROCEDURES PERFORMED:  Cardiac catheterization completed per Dr. Randa Evens on May 13, 2007:  No changes consistent with prior  catheterization in August of 2007.  Suspected noncardiac chest pain.  Please see Dr. Melinda Crutch thorough cardiac catheterization dictation  transcription for further details, as it is not available at the time of  this dictation.   DISCHARGE DIAGNOSES:  1. Chest pain.      a.     Found to be noncardiac.  2. Coronary artery disease.      a.     Status post myocardial infarction November of 2004 of       anterior wall.      b.     Stent to the left anterior descending x two Cypher stents       with ejection fraction range of 35%.      c.     Stress Myoview December of 2006, ejection fraction of 37%       with no evidence of inducible ischemia, but evidence of lateral T       wave apical scar.  3. Hyperlipidemia.  4. Hypertension, labile.  5. Gastroesophageal reflux disease.  6. History of degenerative joint disease.  7. Status post automatic implantable cardioverter-defibrillator (AICD)      placement, August of 2007.   HISTORY OF PRESENT ILLNESS:  This is a 77 year old Caucasian male with  the above-mentioned history with complaints of chest pain which were  normally relieved with nitroglycerin.  The patient noted very mild  episodes that had been transient of chest pain since myocardial  infarction in 2004 which have always been relieved with nitroglycerin;  but, over the last two months, his  chest pain has occurred more often  and found to be more severe.  The pain is an average of once to twice  every three weeks, and is relieved with 1-2 nitroglycerins, but on some  occasions three nitroglycerins.  The patient states that this pain  occurs at rest and with stress.   The morning of admission at around 5:30 after he woke up, and he had  slight chest discomfort and tightness across the anterior chest  bilaterally without radiation.  He noticed the pain to be subsiding on  its own and was mild.  He did not take nitroglycerin at that time.  While attending his granddaughter's kindergarten graduation at about  8:30 that morning, the chest pain became worse with a range of 2-3/10.  After taking three nitroglycerins, the patient began to feel flushed,  with associated nausea and headache.  The pain apparently lasted  approximately one hour.   SECONDARY DISEASE RECURRENT SYMPTOMS:  The patient presented to the  emergency room at around 6:00 p.m. with complaints of continued chest  discomfort.  The patient was given morphine and aspirin and admitted to  rule out a myocardial infarction.  The patient was seen and examined by  Dr. Launa Flight, cardiology fellow for Dr. Juanito Doom.  The patient was  subsequently started on heparin and nitroglycerin, and was kept NPO for  cardiac catheterization the following a.m.   HOSPITAL COURSE:  On May 13, 2007, the patient was scheduled for  cardiac catheterization and he was seen and examined by Dr. Simona Huh  and had symptoms concerning for unstable angina.  His troponins were  found to be negative and EKG did show more prominent anterior T wave ST  changes.  The patient agreed for cardiac catheterization.  Catheterization was performed with results as described above.  Again,  please see Dr. Melinda Crutch thorough dictation concerning the  catheterization for more details.   The patient tolerated the procedure well without any complaints of   bleeding, hematoma, infection of the right groin site.  There were no  atrial arrhythmias or ventricular arrhythmias.  The patient rested  quietly and was found to be stable for discharge.  The patient was seen  and examined by Dr. Randa Evens prior to discharge.   LABORATORY INVESTIGATIONS:  On discharge TSH was 8.656.  BMP:  Sodium  139, potassium 4.2, chloride 106, bicarbonate 28, glucose 103, BUN 14,  creatinine 1.09.  PT 12.9, INR 1.0.  Troponins 0.02 and 0.02,  respectively.  Cholesterol 109, triglyceride 61, HDL 51, LDL 46.  Hemoglobin 12.1, hematocrit 36.2, white blood cells 4.6, platelets  141,000.  BNP 56.0.  Magnesium 2.2.  Chest x-ray revealing no acute  disease.   Vital signs on discharge with a blood pressure of 113/70, pulse 56,  respirations 16, temperature 97.0, O2 saturation 97% on 4 liters.   MEDICATIONS ON DISCHARGE:  1. Aspirin 325 one p.o. once per day.  2. Prevacid 30 mg daily.  3. Coreg 12.5 mg twice per day.  4. Vytorin 10/40 once per day.  5. Plavix 75 mg daily.  6. Spironolactone 12.5 mg daily.  7. Altace 10 mg daily.   ALLERGIES:  No known drug allergies.   FOLLOW-UP PLANS AND APPOINTMENTS:  1. The patient is scheduled to see Dr. Juanito Doom on July 18th at 3:15      p.m. for follow-up appointment post discharge.  2. The patient is to follow up with his primary care physician, Dr.      Artis Flock, for continued medical management; the patient is to call and      make that appointment on his own.  3. The patient has been given prescriptions for Altace 10 mg once per      day; and Coreg 12.5 mg twice per day, as these are new doses.  4. The patient has been advised on post cardiac catheterization      instructions, with particular emphasis to the right      groin site with evidence of bleeding, hematoma or infection.  He is      also call us for any new onset of chest pain or right groin pain.  DICTATION NOTE:  Time spent with the patient to include  physician time,  35 minutes.      Bettey Mare. Lyman Bishop, NP      Salvadore Farber, MD  Electronically Signed  KML/MEDQ  D:  05/13/2007  T:  05/14/2007  Job:  [id number]   cc:   Quita Skye. Artis Flock, M.D.

## 2011-04-07 NOTE — Assessment & Plan Note (Signed)
Corey Jordan                         ELECTROPHYSIOLOGY OFFICE NOTE   Corey Jordan                      MRN:          [id number]  DATE:03/13/2008                            DOB:          07-31-1934    Corey Jordan returns today for followup.  He is a very pleasant male  with an ischemic cardiomyopathy and congestive heart failure status post  ICD insertion.  He returns today for additional followup.  The patient  was last seen by [name] back in August.  The patient had no specific  complaints today.  He denied chest pain or shortness of breath.   MEDICATIONS:  1. Prevacid 30 a day.  2. Aldactone 25 mg one-half tablet daily.  3. Aspirin 325 a day.  4. Plavix 75 a day.  5. Carvedilol 6.25 twice daily.  6. Synthroid 50 mcg daily.  7. Vytorin 10/40 daily.  8. Altace 5 mg a day.   PHYSICAL EXAMINATION:  GENERAL:  He is a pleasant, well-appearing  elderly man in no distress.  VITAL SIGNS:  Blood pressure today was 136/72, pulse 60 and regular,  respirations were 18.  Weight was 150 pounds.  NECK:  No jugular venous distention.  LUNGS:  Clear bilaterally to auscultation.  No wheezes, rales or rhonchi  were present.  CARDIOVASCULAR:  Regular rate and rhythm with normal S1 and S2.  I did  not appreciate any murmurs today.  ABDOMEN:  Soft, nontender.  There was no organomegaly.  EXTREMITIES:  No edema.   Interrogation of his defibrillator demonstrates a [employer] Maximo.  The  R waves were 17, the impedance 720, the threshold 1 volt at 0.2.  Battery voltage was 3.17 volts.  There were no intercurrent IC  therapies.   IMPRESSION:  1. Ischemic cardiomyopathy.  2. Congestive heart failure.  3. Status post implantable cardioverter-defibrillator insertion.   DISCUSSION:  Overall, Corey Jordan is stable.  His defibrillator is  working normally.  We will see him back in the office for ICD check in 1  year.  He will follow up in our [employer] clinic  in 3 months.     Corey Canning. Ladona Ridgel, MD  Electronically Signed    GWT/MedQ  DD: 03/13/2008  DT: 03/13/2008  Job #: [id number]

## 2011-04-10 NOTE — Discharge Summary (Signed)
NAME:  Corey Jordan, VILLAMAR NO.:  [id number]   MEDICAL RECORD NO.:  [id number]          PATIENT TYPE:  INP   LOCATION:  2017                         FACILITY:  [facility]   PHYSICIAN:  Doylene Canning. Ladona Ridgel, M.D.  DATE OF BIRTH:  November 27, 1933   DATE OF ADMISSION:  07/06/2006  DATE OF DISCHARGE:  07/07/2006                                 DISCHARGE SUMMARY   TIME SEEN:  Duration of the exam and discharge:  30 minutes   ALLERGIES:  HE HAS NO KNOWN DRUG ALLERGIES, HOWEVER, HE GETS A ROARING  HEADACHE WITH IMDUR AT 60 MG DAILY.  THIS IS NOT PRESENT WITH IMDUR 30 MG  DAILY.   PRINCIPAL DIAGNOSES:  1. Discharging.  Day one status post implant of Medtronic Maximo VR      cardioverter defibrillator with defibrillator threshold study less than      or equal to 15 joules.  2. Ischemic cardiomyopathy.      a.     Anterior myocardial infarction 2004 with stent x2 of the       proximal LAD.      b.     Ejection fraction 30% of the catheterization June 25, 2006.      c.     Catheterization June 25, 2006 is a Metallurgist of       catheterization 2005 with stents to the LAD patent.      d.     Ongoing angina which was the reason for his catheterization       June 25, 2006 thought to be a subendocardial ischemia secondary to       dilated cardiomyopathy.  3. Class II congestive heart failure.  4. Dyslipidemia.  5. Status post hernia surgery in the past.   PROCEDURE:  July 06, 2006 implant of Medtronic Maximo cardioverter  defibrillator.  Defibrillator threshold study less than or equal to 15  joules.  Dr. Lewayne Bunting, practitioner.  The patient has had no post-  procedural complications.  He remains in sinus rhythm, achieving 94% oxygen  saturation on room air.   BRIEF HISTORY:  [name] is a 77 year old male.  He has a history of  anterior myocardial infarction in 2004.  His ejection fraction has varied in  the past between 25% and 38%.  He had a catheterization June 25, 2006  for  recurrent angina and his ejection fraction was found to be 30% at that time.   Catheterization showed that the first diagonal had 80% stenosis.  The second  diagonal was 100% occluded but has collateral feeds from the left.  Stents  in the LAD are patent x2 and the result of the catheterization with ejection  fraction 30% was to replace the patient on Imdur and re-start Plavix and  increase aspirin from 81 to 325 mg daily.  The patient had been offered  cardioverter defibrillator in the past and now has agreed to undergo the  procedure.   HOSPITAL COURSE:  The patient presents July 06, 2006.  He underwent  implantation of cardioverter defibrillator the same day.  He has had no  post-  procedural complications.  The incision looks as if it is healing very  nicely without hematoma.  The device has been interrogated.  All values  within normal limits.  No changes were made.  His post-procedure chest x-ray  shows that the lead is in appropriate position.  He has no pneumothorax and  no other acute pulmonary process.  I would like to quote that he had an  echocardiogram done May 25, 2005.  It showed ejection fraction 25 to 35%.  He had a stress test done on November 19, 2005.  It shows that his ejection  fraction was 37%.  There was evidence of enlarged anteroapical scar.  No  ischemia.   DISCHARGE MEDICATIONS:  The patient discharges on the following medicines:  1. Coreg 6.25 mg twice daily.  2. Vytorin 10/40 one tab daily at bedtime.  3. Spironolactone 25 mg tablets 1/2 tab daily.  4. Altace 5 mg twice daily.  5. Prevacid 30 mg daily.  6. Enteric-coated aspirin 325 mg daily.  7. Plavix 75 mg daily.  8. The patient was on Imdur 60 mg daily with quite pronounced headache.      He discussed this with Dr. Ladona Ridgel and Dr. Ladona Ridgel recommended that he      take 30 mg daily and that if he has recurrent chest pain, to increase      to 60 mg daily.  9. The patient also goes home with  Darvocet-N 100 one to two tabs every      three to four hours as needed for pain.   FOLLOWUP:  He has follow up at [facility].  1. Visit with Dr. Daleen Squibb July 20, 2006.  2. ICD clinic Thursday, July 22, 2006 at 9:20 in the morning.  3. To see Dr. Ladona Ridgel in December.  Dr. Lubertha Basque office will call with that      appointment.   LABORATORY STUDIES:  This admission serum electrolytes, sodium 139,  potassium 4.4, chloride 102, carbonate 32, glucose 112, BUN 14, creatinine  1.1.  Complete blood count white cells 5.2, hemoglobin 13.7, hematocrit  41.2, and platelets 182.  PTT was 35.7, PT was 11.7, INR 0.9.      Maple Mirza, P.A.    ______________________________  Doylene Canning. Ladona Ridgel, M.D.    GM/MEDQ  D:  07/07/2006  T:  07/07/2006  Job:  [id number]   cc:   Quita Skye. Artis Flock, M.D.

## 2011-04-10 NOTE — H&P (Signed)
NAME:  Corey Jordan, RENTERIA NO.:  [id number]   MEDICAL RECORD NO.:  [id number]                   PATIENT TYPE:  INP   LOCATION:  1826                                 FACILITY:  [facility]   PHYSICIAN:  Jesse Sans. Wall, M.D.                DATE OF BIRTH:  1934-10-07   DATE OF ADMISSION:  10/23/2003  DATE OF DISCHARGE:                                HISTORY & PHYSICAL   CHIEF COMPLAINT:  Chest pressure and tightness up into my shoulders, came on  suddenly about 11:45.   HISTORY OF PRESENT ILLNESS:  [name] is a 77 year old married  white male with a history of nonobstructive coronary artery disease by  catheterization last year, per he and his wife.  At that time he was treated  with Hyzaar and aspirin. He has a history of hypertension.  He had the onset  of chest pressure around 11:45 p.m. on October 22, 2003. He let his wife  know about it at 12:30 and she called EMS.  He arrived in the emergency  department at about 1 a.m.  EKG showed ST elevation in V1 through V6 as well  as in aVF. He has been given IV nitroglycerin, heparin, and aspirin. He has  been bradycardic down in the 40s with sinus and also dropped his pressure  stopping a nitroglycerin drip. He is having ongoing pressure, nausea, and  diaphoresis.   PAST MEDICAL HISTORY:  Significant for hypertension. He has had an inguinal  hernia repair times three on the left side. He had a questionable facial  stroke 20 years ago per his wife.   ALLERGIES:  No known drug allergies.   MEDICATIONS:  Hyzaar 50/12.5 a day, aspirin 325 mg a day.   REVIEW OF SYSTEMS:  Otherwise unremarkable. There is no history of bleeding,  melena, or hematochezia.   FAMILY HISTORY:  Noncontributory.   PHYSICAL EXAMINATION:  GENERAL: He is pale and diaphoretic. He is in mild  distress.  VITAL SIGNS: Blood pressure 112/57, pulse 42 to 54 and sinus bradycardia,  respiratory rate 18, oxygen saturation 100%, and  temperature is 98.  HEENT: Other than being pale and diaphoretic is unremarkable.  NECK: Carotid upstrokes are full bilaterally without bruits. There is no  JVD. Thyroid is normal.  LUNGS: Clear.  CARDIAC: Regular rate and rhythm without murmurs, rubs, or gallops.  ABDOMEN: Soft with good bowel sounds.  EXTREMITIES: No edema. Pulses are 2+/4+ bilaterally.  NEUROLOGIC: Intact.   I-STAT demonstrates a creatinine of 1.2, potassium 3.1, blood sugar  (nonfasting) 150. Hemoglobin 15.   ASSESSMENT AND PLAN:  1. Acute anterior myocardial infarction about an hour and a half into     symptom onset. The cardiac catheterization lab has been called as was the     research foundation of [employer]. Dr. Charlies Constable will perform the     acute catheterization. The patient's wife  has been told the     circumstances, indications, and agrees to proceed.  2. Bradycardia secondary to #1.  3. History of hypertension.  4. Hyperkalemia.  5. History of nonobstructive coronary artery disease.  6. History of left inguinal hernia repair times three.                                                Thomas C. Wall, M.D.    TCW/MEDQ  D:  10/23/2003  T:  10/23/2003  Job:  [id number]   cc:   Vale Haven. Andrey Campanile, M.D.  [address]  [place]  [place] [address]  Fax: [phone]   Jesse Sans. Wall, M.D.

## 2011-04-10 NOTE — Assessment & Plan Note (Signed)
Corey Jordan                            CARDIOLOGY OFFICE NOTE   Corey, Jordan                      MRN:          [id number]  DATE:12/28/2006                            DOB:          04-13-1934    [name] returns today for further management of the following  issues:  1. Coronary artery disease status post anterior wall myocardial      infarction November 2004.  He had drug-eluting stent of the LAD x2.      His ejection fraction is around 30-35%.  His last stress Myoview      was November 19, 2005, showing an EF of 37% with no ischemia.  He      has a large anterior apical scar.  2. Status post AICD.  He has had no discharges and EP followup      recently was stable.  3. Hyperlipidemia.  4. Chronic left ventricular systolic dysfunction.   His biggest complaint today is he gets a little more short of breath  when he climbs hills or steps.   His medications are:  1. Aspirin 81 mg a day.  2. Prevacid 30 mg a day.  3. Vytorin 10/40.  4. Coreg 6.25 b.i.d.  5. Altace 5 b.i.d.   His blood pressure is 141/75.  His pressures usually run in the low 100s  at home.  He is always high in the office.  His heart rate is 57, sinus  bradycardia.  EKG is unchanged.  HEENT is normocephalic, atraumatic,  PERRLA, extraocular movements intact, sclerae clear.  Carotid upstrokes  are equal bilaterally without bruits, no JVD.  Lungs are clear.  Heart  reveals a regular rate and rhythm without gallop.  Abdominal exam is  soft with good bowel sounds.  There is no midline bruit, there is  hepatomegaly.  Extremities with no cyanosis, clubbing or edema.  Pulses  are present.   ASSESSMENT AND PLAN:  [name] is doing well.  I think his dyspnea  on exertion is to be expected with his LV systolic dysfunction.  I have  explained this to him.  I cannot go up on his medications any further  because of a history of orthostatic hypotension and hypotension.  His  blood pressures usually run very low at home.   Please note that he is also on spironolactone 12.5 mg a day.   I will plan on seeing him back again in 6 months.  He will be due a  stress Myoview in December 2008.     Thomas C. Daleen Squibb, MD, [facility]  Electronically Signed    TCW/MedQ  DD: 12/28/2006  DT: 12/28/2006  Job #: [id number]   cc:   Quita Skye. Artis Flock, M.D.

## 2011-04-10 NOTE — Cardiovascular Report (Signed)
NAME:  Corey Jordan, Corey Jordan NO.:  [id number]   MEDICAL RECORD NO.:  [id number]                   PATIENT TYPE:  INP   LOCATION:  2921                                 FACILITY:  [facility]   PHYSICIAN:  Charlies Constable, M.D.                  DATE OF BIRTH:  [birth year]   DATE OF PROCEDURE:  10/23/2003  DATE OF DISCHARGE:                              CARDIAC CATHETERIZATION   CLINICAL HISTORY:  Mr. Orrego is 77 years old and had nonobstructive  coronary disease by catheterization a year ago.  On November 29 at 11:45 he  developed the onset of severe chest pain and presented to [facility] with an acute anterior wall infarction.  He was seen by Jesse Sans.  Wall, M.D. and given heparin and aspirin and transported to the  catheterization laboratory for angiography and probable intervention.   PROCEDURE:  The procedure was performed via the right femoral artery using  arterial sheath and 6-French preformed coronary catheters.  A front wall  arterial puncture was performed and Omnipaque contrast was used.  After  completion of the diagnostic study we made a decision to proceed with  intervention on the left anterior descending artery.  Because the LVEDP was  35 he was given 20 mg of Lasix IV.   The patient was enrolled in the FLAME trial which uses distal protection  with the filter wire.  The patient's initial ACT was greater than 200  seconds from the heparin he received in the emergency room and he was given  double bolus Integrilin infusion.  We gave Plavix at the end of procedure  because he was nauseated at the beginning of procedure.  We used a Q4 7-  [place] guiding catheter with side holes and a PT2 light support wire.  We  crossed the totally occluded proximal LAD with the wire without difficulty  but this did not re-establish flow.  We then performed two runs with the  Export catheter and obtained a small amount of thrombus and obtained TIMI 1  flow  distally.  We could visualize the vessel well enough to see that it  would accept the filter wire EZ small.  We then passed the filter wire  across the lesion into the mid LAD and removed the PT2 wire.  We then  predilated with a 2.25 x 20 mm Maverick balloon performing two inflations up  to 6 atmospheres for 30 seconds.  We then deployed a 2.5 x 18 mm Cypher  stent deploying this with one inflation up to 10 atmospheres for 30 seconds.  We then post dilated with a 2.75 x 15 Quantum Maverick with two inflations  up to 15 atmospheres for 30 seconds avoiding the distal edge.  Unfortunately, this resulted in edge tear of the proximal edge of the stent.  For this reason we placed a second 3.0 x 8 mm Cypher overlapping  the first  stent and placing the proximal edge distal to the diagonal branch.  We  deployed this with one inflation of 15 atmospheres for 30 seconds.  We then  attempted to retrieve the filter but the retrieval sheath would not pass  through the stent.  There was an angle.  The wire was not coaxial to the  stent at the entry point.  We then exchanged for a curved tip stilette but  we had difficulty manipulating the end of this retrieval sheath and were  also unable to pass it through the stent.  We tried to withdraw the  retrieval sheath but it had become wrapped with the wire and pulled the  filter back.  We finally had to pull the filter back through the stent and  retrieve it just proximal to the stent with the bent tip retrieval sheath.  The entire system was then removed from the coronary artery.  Repeat  diagnostics were then performed through the guiding catheter.   The patient tolerated procedure well and left the laboratory in satisfactory  condition.   RESULTS:  Left main coronary artery:  Free of significant disease.   Left anterior descending artery:  Gave rise to a diagonal branch and a small  septal perforator and then was completely occluded.  There were no   collaterals visualized.   Circumflex artery:  Gave rise to a small intermediate branch, an atrial  branch, a marginal branch, and two posterolateral branches.  These vessels  were free of significant disease.   Right coronary artery:  Small to moderate sized vessel.  Gave rise to a  conus branch, right ventricular branch, a posterior descending branch, and a  small posterolateral branch.  There was 40% segmental disease in the  proximal right coronary artery.   LEFT VENTRICULOGRAM:  The left ventriculogram performed in the RAO  projection showed akinesis of the anterolateral wall and apex.  The anterior  wall near the base in the mid inferior wall and the inferobasal wall moved  well.  The estimated ejection fraction was 30-35%.  The aortic pressure was  150/83 with a mean of 113.  Left ventricular pressure was 150/34.   Following placement of tandem overlying stents in the proximal LAD the  stenosis improved from 100% to less than 10% and the flow improved from TIMI  0 to TIMI 3 flow.  The distal vessel was irregular and gave rise to a large  septal perforator and a moderate sized diagonal branch and there was no  major obstruction distally.   We obtained a small amount of thrombus from the Export aspirate.   The patient had the onset of chest pain at 11:45 on November 29.  Arrived at  [facility] at 1:03 a.m. on November 30.  The Export catheter  established reperfusion at 2:42.  This gave a door to balloon time of one  hour and 39 minutes and a reperfusion time of two hours and 57 minutes.   CONCLUSIONS:  1. Acute anterior wall myocardial infarction with total occlusion of     proximal left anterior descending, 80% narrowing in the first diagonal     branch, no major obstruction of the circumflex artery, 40% narrowing in     the proximal right coronary artery, and anterolateral wall and apical    wall akinesis with an estimated ejection fraction of 30-35%.  2.  Successful reperfusion and stenting of the lesion in the proximal left  anterior descending with distal protection with filter wire EZ small and     with the use of two overlapping Cypher stents with improvement of center     of narrowing from 100% to less than 10% and improvement in flow from TIMI     0 to TIMI 3 flow.   DISPOSITION:  The patient will return to postanesthesia care unit for  further observation.  Will plan an MRI and will plan evaluation of infarct  size on MRI and with a Sestamibi scan as part of the FLAME trial.  Will  treat him with beta blockers and ACE inhibitors.                                               Charlies Constable, M.D.    BB/MEDQ  D:  10/23/2003  T:  10/23/2003  Job:  [id number]   cc:   Vale Haven. Andrey Campanile, M.D.  [address]  [place]  [place] [address]  Fax: [phone]   [facility]

## 2011-04-10 NOTE — Discharge Summary (Signed)
NAME:  Corey Jordan, Corey Jordan NO.:  [id number]   MEDICAL RECORD NO.:  [id number]                   PATIENT TYPE:  INP   LOCATION:  3714                                 FACILITY:  [facility]   PHYSICIAN:  Guy Franco, P.A. [facility]                DATE OF BIRTH:  [birth year]   DATE OF ADMISSION:  DATE OF DISCHARGE:  11/04/2003                                 DISCHARGE SUMMARY   DISCHARGE DIAGNOSES:  1. Large anterior myocardial infarction.  2. Ischemic cardiomyopathy with ejection fraction of 30%.  3. Hypertension, treated.  4. Significant apical akinesis which will require Coumadin therapy.  5. Long-term Coumadin therapy.  6. Hyperlipidemia, treated.   HOSPITAL COURSE:  The patient is a 77 year old male patient with a history  of nonspecific coronary artery disease with catheterization last year  according to him.  He does have a history of hypertension.  On the evening  of admission, he had onset of pressure at 11:45 p.m.  He let his wife know  about the pain at about 12:30 a.m. on October 23, 2003, and she called EMS.   EKG revealed an acute anterior myocardial infarction and bradycardia.  The  patient was taken emergently to the cardiac catheterization laboratory and  was found to have a totaled LAD proximally with an 80% D1 lesion.  Circumflex, no significant abnormalities.  Right coronary artery with a 40%  proximal lesion.  LV gram revealed anterolateral and apical akinesis,  ejection fraction of 30 to 35%.   A followup echocardiogram does confirm apical akinesis with an ejection  fraction of 30 to 35%.  Coumadin as well as Lovenox was started to prevent  LV thrombus from forming.  The patient remained in the hospital for  anticoagulation therapy.  On October 29, 2003, he did have some recurrent  angina.  The stent side of the LAD had a less than 10% restenosis.  Just  distal to the stent, there was a 50% stenosis.  There was still an 80% first  diagonal  stenosis that was unchanged.  The circumflex and RCA were  unchanged.  There was no obvious source of ischemia.  The patient was  continued on Coumadin load.   By November 04, 2003, the patient's INR was 2.3.  He was felt to be ready  for discharge to home.  His pulse was 62, respirations 20, blood pressure 98/68.  Temperature 98.3.  Lungs were clear to auscultation bilaterally.  Heart, regular rate and  rhythm, no lower-extremity edema.   LABORATORY DATA:  Lab studies during his hospital stay include pro-time of  20.7, INR 2.3 at discharge, hemoglobin 12.9, hematocrit 36.3, BUN at 12,  creatinine 1.1, potassium 4.3.  Platelets 203,000.  Maximum CK was 4383 with  an MB fraction of 220.8.  Maximum troponin was 99.81.  Total cholesterol  183, triglycerides 56.  HDL 57, LDL 116.  TSH 2.283.  DISCHARGE MEDICATIONS:  1. Baby aspirin one a day.  2. Plavix 75 mg a day.  3. Lipitor 20 mg a day.  4. Sublingual nitroglycerin as needed for chest pain.  5. Altace 2.5 mg a day.  6. Coreg 3.125 mg twice a day which will need to be up-titrated in the     office.  7. Coumadin 5 mg once a day.  8. Imdur 30 mg a day.  9. Tylenol as needed for pain.   DISCHARGE INSTRUCTIONS:  1. No strenuous activity or driving until seen by Dr. Daleen Squibb.  2. Remain on a low-fat diet.  3. Cleaning of catheterization site with soap and water.  No scrubbing.   FOLLOWUP:  1. The patient is involved in a research trial, Flame, and the research     department will be in contact with the patient regarding scans 5-15 days     post procedure.  2. The patient will need to be followed up in the Coumadin clinic this week     as well as with Dr. Daleen Squibb in two to three weeks.  I have left a message     for the office to contact the patient regarding these appointments.                                                Guy Franco, P.A. [facility]    LB/MEDQ  D:  11/04/2003  T:  11/04/2003  Job:  [id number]   cc:   Vale Haven. Andrey Campanile,  M.D.  [address]  [place]  [place] [address]  Fax: [phone]   Jesse Sans. Wall, M.D.

## 2011-04-10 NOTE — Assessment & Plan Note (Signed)
[facility]                            CARDIOLOGY OFFICE NOTE   Corey Jordan, Corey Jordan                      MRN:          [id number]  DATE:03/11/2007                            DOB:          01/16/1934    Corey Jordan comes in today because of dizziness.   He had a flu bug about a month and a half ago.  Ever since then he has  had problems with dizziness which can be chronic throughout the day.  He  has been to an Ear, Nose, and Throat with Dr. Haroldine Laws who cleaned out  his ears and did some auditory testing and other testing.  This was all  normal.  He has been through several courses of steroids.   He had his device checked the other day and has not had any shock or  ventricular arrhythmias.   He notices that he is not dizzy when he lies down.  He has even tried  Antivert which has not helped.   MEDICATIONS:  1. Vytorin 10/40 daily.  2. Prevacid 30 mg daily.  3. Coreg 6.25 b.i.d.  4. Altace 5 mg b.i.d.  5. Spironolactone 12.5 daily.  6. Aspirin 325 a day.  7. Plavix 75 mg a day.   His blood pressure today was 146/77, heart rate 66 lying.  Standing was  163/83, pulse 67 and he was dizzy; he remained this level for 2-5  minutes, he remained dizzy.   EXAMINATION:  PERRLA, extraocular movements intact, he has no nystagmus,  facial symmetry is normal.  Carotids are full without bruits, there is  no JVD, thyroid is not enlarged, trachea is midline.  LUNGS:  Clear.  HEART:  Reveals a regular rate and rhythm without gallop.  ABDOMINAL:  Soft.  EXTREMITIES:  Reveal no edema, pulses are intact.  NEURO:  Grossly intact.   His blood pressures at home are always much lower, running anywhere from  95 systolic to about 118.  He averages about 105-110.   I have asked him to check pressure lying and standing at home.  If he is  dropping more than 10 from a systolic perspective to please let me know.  Otherwise I think is residual effects of his  viral illness.  Hopefully it will slowly resolve.  I will plan on seeing  him back otherwise in October 2008 as originally scheduled.     Thomas C. Daleen Squibb, MD, [facility]  Electronically Signed    TCW/MedQ  DD: 03/11/2007  DT: 03/12/2007  Job #: [id number]   cc:   Quita Skye. Artis Flock, M.D.

## 2011-04-10 NOTE — H&P (Signed)
NAME:  Corey Jordan, Corey Jordan NO.:  [id number]   MEDICAL RECORD NO.:  [id number]                   PATIENT TYPE:  INP   LOCATION:  3705                                 FACILITY:  [facility]   PHYSICIAN:  Rollene Rotunda, M.D.                DATE OF BIRTH:  1934-06-12   DATE OF ADMISSION:  06/11/2004  DATE OF DISCHARGE:                                HISTORY & PHYSICAL   REASON FOR PRESENTATION:  Patient with chest pain.   HISTORY OF PRESENT ILLNESS:  The patient is a very pleasant 77 year old  gentleman who had an anterior wall myocardial infarction acutely in November  of 2004 and had Cypher stenting times two of a totally occluded proximal  LAD.  He had an ischemic cardiomyopathy with an ejection fraction of 30%.  He has had follow-up of this with a Cardiolite in May which demonstrated the  infarct with mild peri-infarct ischemia and an ejection fraction of 32%.  He  has been on chronic Coumadin for an apical wall motion abnormality.   He had been doing relatively well. He did cardiac rehab.  However, last week  he noticed on his treadmill that he had some chest discomfort.  He also  noticed this the next day while walking and while on an exercise bike.  Following this he started taking nitroglycerin when he would notice this  pain. Yesterday he had discomfort that was more sharp and 3 out of 10 in  intensity. It took two sublingual nitroglycerin before it went away; it  lasted for a total of ten minutes.  He says the symptoms are similar to his  previous angina.  He has not had any associated nausea, vomiting or  diaphoresis.  It is substernal.  It is not radiating to his neck or to his  arms.  He has had no shortness of breath and he denies any PND or orthopnea.  He has had no palpitations, syncope or pre-syncope.   PAST MEDICAL HISTORY:  1. Coronary artery disease as described.  2. Ischemic cardiomyopathy.  3. Chronic Coumadin therapy.  4.  Dyslipidemia.   PAST SURGICAL HISTORY:  1. Hernia surgery times three.  2. Back surgery.   ALLERGIES:  None.   CURRENT MEDICATIONS:  1. Aspirin 81 mg a day.  2. Plavix 75 mg a day.  3. Altace 2.5 mg a day.  4. Coumadin.  5. Prevacid.  6. Coreg 9.375 mg b.i.d.  7. Vytorin 10/40 mg.   SOCIAL HISTORY:  The patient lives in [place] with his wife.  He has  never smoked cigarettes.  He does not drink alcohol. He is retired from the  post office.   FAMILY HISTORY:  Noncontributory for early coronary artery disease.   REVIEW OF SYMPTOMS:  As stated in the history of present illness, otherwise  negative for other systems.   PHYSICAL EXAMINATION:  GENERAL:  The patient is in  no distress.  VITAL SIGNS:  Blood pressure 145/79, heart rate 58 and regular, afebrile.  HEENT:  Eyes are unremarkable.  Pupils equal, round and reactive to light.  Fundi are not visualized. Oral mucosa is unremarkable.  NECK:  No jugular venous distention, wave form within normal limits, carotid  upstroke brisk and symmetric, no bruits or thyromegaly.  LYMPHATICS:  No cervical, axillary or inguinal adenopathy.  LUNGS:  Clear to auscultation bilaterally.  BACK:  No costovertebral angle tenderness.  CHEST:  Unremarkable.  HEART:  PMI neither displaced nor sustained, S1 and S2 are within normal  limits, no S3, no S4 and no murmurs.  ABDOMEN:  Flat, positive bowel sounds, normal in frequency and pitch, no  bruits, no rebound, no guarding, no midline pulse, no masses, no  hepatomegaly and no splenomegaly.  SKIN:  No rashes and no nodules.  EXTREMITIES:  2+ pulses bilaterally with no cyanosis, clubbing or edema.  NEUROLOGIC:  Oriented to person, place and time. Cranial nerves II-XII are  grossly intact.  Motor is grossly intact.   LABORATORY DATA:  Laboratories are pending.   EKG:  Sinus rhythm, rate 59, axes within normal limits, deep anterolateral T  wave inversions (I suspect that this is evolution of his  prior infarct.  I  do not have old EKG's at this point for comparison).   ASSESSMENT AND PLAN:  1. Chest pain.  The patient's chest pain is worrisome for unstable angina.     Given this, I think cardiac catheterization is indicated.  He will be     maintained on heparin when his INR drifts down and nitrates for pain     control.  He may need other medications as listed.  He will have a     cardiac catheterization when his INR is 1.7 or less.  2. Ischemic cardiomyopathy. We may be able to titrate his medications; in     particular, we might be able to switch his Altace to b.i.d.  Dr. Daleen Squibb can     also talk to him about an implantable defibrillator.  3. Dyslipidemia.  We will check this in the a.m.                                                Rollene Rotunda, M.D.    JH/MEDQ  D:  06/11/2004  T:  06/11/2004  Job:  [id number]   cc:   Vale Haven. Andrey Campanile, M.D.  [address]  [place]  [place] [address]  Fax: [phone]

## 2011-04-10 NOTE — Assessment & Plan Note (Signed)
[facility]                           ELECTROPHYSIOLOGY OFFICE NOTE   Corey Jordan, Corey Jordan                      MRN:          [id number]  DATE:06/30/2006                            DOB:          1934/08/09    Corey Jordan returns today for followup.  He is a very pleasant man with an  ischemic cardiomyopathy who is referred for consideration for prophylactic  ICD insertion.   HISTORY OF PRESENT ILLNESS:  The patient had a history of remote myocardial  infarction in 2004.  His EF has varied in the past between 25% and 38%.  Most recently by catheterization his EF was 30%.  He has patent grafts and  three-vessel severe native disease.  The patient has heart failure class I  to II and overall has been fairly stable with no syncope.  His additional  past medical history is notable for dyslipidemia.  He has a history of  hernia surgery in the past.   MEDICATIONS:  Include Plavix, aspirin, Imdur, Altace, Coreg, Vytorin, and  Prevacid.   SOCIAL HISTORY:  The patient denies tobacco or ethanol use.  He is a retired  Firefighter.  He denies any allergies.   REVIEW OF SYSTEMS:  Notable for some reflux symptoms and shortness of breath  with exertion and chest pain (atypical).  Otherwise, see the EP review of  systems.  All systems revealed and found to be negative except as noted in  the HPI.   PHYSICAL EXAMINATION:  GENERAL:  He is a pleasant, well-appearing man in no  acute distress.  VITAL SIGNS:  The blood pressure today was 120/70, the pulse 63 and regular,  respirations were 18, the weight was 153 pounds.  NECK:  Revealed no jugular venous distention.  HEENT:  Normocephalic, atraumatic.  Pupils equal and round.  The oropharynx  was moist, the sclerae anicteric.  NECK:  Revealed no jugular venous distention.  There was no thyromegaly.  LUNGS:  Clear bilaterally to auscultation.  There were no wheezes, rales, or  rhonchi.  CARDIOVASCULAR:  Revealed a regular rate and rhythm with normal S1 and S2.  There was no murmurs, rubs, or gallops present.  The PMI was enlarged and  laterally displaced.  ABDOMEN:  Soft, nontender, nondistended.  There was no organomegaly.  The  bowel sounds were present.  There was no rebound or guarding.  EXTREMITIES:  Demonstrated no clubbing, cyanosis, or edema.  Pulses were 2+  and symmetric.  NEUROLOGIC:  Alert and oriented x3 with cranial nerves intact.  Strength was  5/5 and symmetric.   EKG demonstrates sinus rhythm with anterior T-wave abnormality.   IMPRESSION:  1.  Ischemic cardiomyopathy.  2.  Congestive heart failure (class I).  3.  Coronary artery disease status post prior myocardial infarction.  4.  Dyslipidemia.   DISCUSSION:  Corey Jordan is a good candidate for prophylactic ICD insertion  based on his severe LV dysfunction (EF 30%) and overall good quality of  life.  I have discussed the risks, benefits, good and expectations of ICD  implantation with the patient and his wife  and they would like to proceed.                                   Doylene Canning. Ladona Ridgel, MD  Previous patient of Dr. Karma Ganja  GWT/MedQ  DD:  06/30/2006  DT:  06/30/2006  Job #:  [id number]   cc:   Quita Skye. Artis Flock, MD

## 2011-04-10 NOTE — Op Note (Signed)
NAME:  Corey Jordan, Corey Jordan NO.:  [id number]   MEDICAL RECORD NO.:  [id number]          PATIENT TYPE:  INP   LOCATION:  2017                         FACILITY:  [facility]   PHYSICIAN:  Doylene Canning. Ladona Ridgel, M.D.  DATE OF BIRTH:  05/23/1934   DATE OF PROCEDURE:  07/06/2006  DATE OF DISCHARGE:                                 OPERATIVE REPORT   PROCEDURE PERFORMED:  Implantation of a single chamber implantable  cardioverter-defibrillator.   INDICATIONS:  Ischemic cardiomyopathy, class I heart failure, EF of 30%.   I. INTRODUCTION:  The patient is a 77 year old man with a history of prior  myocardial infarction and severe LV dysfunction with an EF of 30% for  several years, on maximal medical therapies class I symptoms.  He is now  referred for prophylactic ICD insertion (__________ ).   II. PROCEDURE:  After informed consent was obtained, the patient was taken  to the diagnostic EP lab in the fasting state.  After the usual preparation  and draping, intravenous fentanyl and metaxalone was given for sedation.  Then 30 mL of lidocaine was infiltrated into the left infraclavicular  region.  A 7-cm incision was carried out over this region and electrocautery  utilized to dissect down to the cephalic vein.  It was cannulated directly  and the [employer] secure model [phone], active fixation  defibrillation lead, serial number [id number] was advanced into the right  ventricle by way of the cephalic vein.  Mapping was carried out; at the  final site the R waves measured 20 mV and the pacing impedance of 840 ohms.  The pacing threshold of 0.5 milliseconds and 10 volts pacing did not distend  with the diaphragm.  With the lead actively fixed, it was secured to  subpectoralis fascia with a figure-of-eight silk suture.  The sewing sleeve  was also secured with a silk suture.  Electrocautery was utilized to make a  subcutaneous pocket.  Kanamycin irrigation was utilized to  irrigate the  pocket; and electrocautery utilized to assure hemostasis.  The Medtronic  maximal VR model 72, 32 single chamber defibrillator serial number PRN-  [id number] was connected to the defibrillation lead and placed in the  subcutaneous pocket.  Te generator was secured with a silk suture.  The  kanamycin irrigation was utilized to irrigate the pocket; and electrocautery  utilized to assure hemostasis.   At this point, defibrillation threshold testing was carried out.  After the  patient was more deeply sedated with fentanyl and Versed, VF was induced  with a T wave shock.  A 15 joules shock was subsequently delivered which  terminated VF and restored sinus rhythm.  Five minutes was allowed to  elapse; and the second DFT test carried out.  Again, VF was induced with T  wave shock; and, again, a 15 joules shock was delivered which terminated VF  and restored sinus rhythm.   At this point no additional defibrillation threshold testing was carried  out; and the incision was closed with a layer of 2-0 Vicryl followed by a  layer of 3-0 Vicryl, followed by  a layer of 4-0 Vicryl.  Benzoin was painted  on the skin; Steri-Strips were applied; and a pressure dressing was placed;  and the patient was returned to his room in satisfactory condition.   III. COMPLICATIONS:  There were no immediate procedure complications.   IV. RESULTS:  This demonstrates successful implantation of a Medtronic  single chamber defibrillator in a patient with an ischemic cardiomyopathy  and an EF of 30%.           ______________________________  Doylene Canning. Ladona Ridgel, M.D.     GWT/MEDQ  D:  07/06/2006  T:  07/06/2006  Job:  [id number]   cc:   Quita Skye. Artis Flock, M.D.

## 2011-04-10 NOTE — Assessment & Plan Note (Signed)
[facility]                              CARDIOLOGY OFFICE NOTE   JOSEGUADALUPE, STAN                      MRN:          [id number]  DATE:07/20/2006                            DOB:          [birth year]    Mr. Cardin returns today after being discharged on July 07, 2006 for a  defibrillator implant.  This was accomplished by Dr. Lewayne Bunting.   He had a catheterization in early August, which showed patent stents.  His  EF was around 30%.   He has felt really lightheaded and has had a headache 60.  They cut him to  30 mg.  The rest of his medications are unchanged except he is now on a full  aspirin 325 a day and Plavix 75 mg daily.   His blood pressure is always up in the office and is again today at 151/80.  He brings me a whole list of blood pressures at home, and they are all  running around 90-110 on the average systolic with good diastolic pressures.   His defibrillator was checked today and is functioning properly.  There have  been no shocks.   PHYSICAL EXAMINATION:  CHEST:  His defibrillator site has healed nicely.  LUNGS:  Clear.  HEART:  Regular rate and rhythm.  There is no rub.  EXTREMITIES:  No edema.  Pulses are intact.   EKG shows normal sinus rhythm with T wave changes anteriorly, as before.   Mr. Quiles is doing well.  I have asked him to stop his Imdur.  I think he  is getting very little benefit from this.  He is using sublingual  nitroglycerin p.r.n.  We plan to see him back in six months.                               Thomas C. Daleen Squibb, MD, [facility]    TCW/MedQ  DD:  07/20/2006  DT:  07/21/2006  Job #:  [id number]   cc:   Quita Skye. Artis Flock, MD

## 2011-04-10 NOTE — Op Note (Signed)
[place]  Patient:    Corey Jordan, Corey Jordan Visit Number: [id number] MRN: [id number]          Service Type: SUR Location: 3000 3032 [id number] Attending Physician:  Emeterio Reeve Dictated by:   Payton Doughty, M.D. Proc. Date: 02/17/02 Admit Date:  02/17/2002 Discharge Date: 02/18/2002                             Operative Report  PREOPERATIVE DIAGNOSIS: Herniated disk, L5-S1, left.  POSTOPERATIVE DIAGNOSIS: Herniated disk, L5-S1, left.  OPERATIVE PROCEDURE: Left L5-S1 laminectomy and discectomy.  SURGEON: Payton Doughty, M.D.  SERVICE: Neurosurgery.  ANESTHESIA: General endotracheal.  PREPARATION: Prepped with sterile alcohol wipe.  COMPLICATIONS: None.  NURSE ASSISTANT: Dr. Basilia Jumbo.  DOCTOR ASSISTANT: Hewitt Shorts, M.D.  INDICATIONS: This is a 77 year old, right-handed white gentleman, with a herniated disk at L5-S1.  DESCRIPTION OF PROCEDURE: He was taken to the operating room, smoothly anesthetized and intubated, and placed prone on the operating table with pressure points padded.  Following shave, prep and drape in the usual sterile fashion, the skin was infiltrated with 1% lidocaine with 1:400,000 epinephrine. The skin as incised from the mid S1 to mid L5 and the lamina revealed L5 and S1 were exposed on the left side in the subperiosteal plane. Intraoperative x-ray confirmed correctness level. Hemisemilaminectomy of L5 was carried out to the top of ligamentum flavum that was removed in a retrograde fashion. Lateral decompression was carried out to the lateral edge of the S1 nerve root, which was retracted medially. This exposed the subligamentous disk protrusion which significantly elevated the left S1 root and extended out to the foramen. Diskectomy was carried out after dividing the annular fibers. Lateral exploration into the neural foramen was carried out and compression of the disk material into the disk space and removal  from there was accomplished. The fiber was identified and followed down in the neural foramen with a hook and found to be free. Following complete removal of all grasping material gentle curettage of the end plate, the wound was irrigated and hemostasis assured. The anterior epidural space was carefully explored and no fragments were ______. The laminectomy defect was covered with Depo-Medrol soaked ______. The fascia was then reapproximated with 0 Vicryl in interrupted fashion, subcutaneous tissues reapproximated with 0 Vicryl in interrupted fashion, subcuticular tissues reapproximated with 3-0 Vicryl in interrupted fashion. The skin was closed with 4-0 Vicryl in a running subcuticular fashion. Benzoin and Steri-Strips were placed, made occlusive with Telfa and OpSite. The patient returned to the recovery room in good condition. Dictated by:   Payton Doughty, M.D. Attending Physician:  Emeterio Reeve DD:  02/17/02 TD:  02/19/02 Job: [id number]

## 2011-04-10 NOTE — H&P (Signed)
[place]. [facility]  Patient:    Corey Jordan, Corey Jordan Visit Number: [id number] MRN: [id number]          Service Type: MED Location: 2000 2015 01 Attending Physician:  Daisey Must Dictated by:   Daisey Must, M.D. [facility] Admit Date:  04/28/2002   CC:         Corey Jordan C. Andrey Campanile, M.D.   History and Physical  CHIEF COMPLAINT:  Chest pain.  HISTORY OF PRESENT ILLNESS:  [name] is a 77 year old male who presented with a complaint of chest pain early this morning.  He states that he had just gotten up to go to the bathroom and when he laid back in bed he developed the onset of tightness in the anterior chest which radiated into the left shoulder.  There was mild nausea associated with this discomfort, but no vomiting, diaphoresis, or dyspnea.  His discomfort persisted and he ultimately presented to the emergency room.  He states that at worse it was 3-4/10 in severity.  On arrival in the emergency room, it was 1/10 and after beginning nitroglycerin therapy it was relieved completely.  He currently feels well and is chest pain-free.  [name] actually underwent cardiac catheterization approximately 15 years ago after presenting with left arm pain.  By his report, this was normal.  He has not had any chest pain since that time.  Recently he noted some pain in his left arm with movement, but has otherwise been free of anginal-type symptoms.  He denies any symptoms of exertional dyspnea, orthopnea, PND, or edema.  He denies any palpitations, syncope, or presyncope.  Cardiac risk factors are negative for diabetes, known hyperlipidemia, tobacco, or family history.  He does have a history of labile hypertension not requiring treatment.  PAST MEDICAL HISTORY: 1. Status post lumbar laminectomy in March of 2003. 2. Status post left inguinal hernia repair x 3. 3. History of labile hypertension not requiring therapy.  CURRENT MEDICATIONS:   None.  ALLERGIES:  No known drug allergies.  SOCIAL HISTORY:  As noted in the physician assistants admission note.  FAMILY HISTORY:  As noted in the physician assistants admission note.  REVIEW OF SYSTEMS:  As noted in the physician assistants admission note.  PHYSICAL EXAMINATION:  In general, this is a well-appearing male in no acute distress.  VITAL SIGNS:  Initial vital signs showed temperature 97.4 degrees, pulse 64 and regular, respirations 18, and blood pressure 195/92.  The follow-up blood pressure was 163/60.  The oxygen saturation on room air was 97%.  SKIN:  Warm and dry without generalized rash.  HEENT:  Normocephalic and atraumatic.  Sclerae anicteric.  The mouth and oral mucosa were unremarkable.  NECK:  No adenopathy or thyromegaly.  No JVD.  Carotid upstroke normal without bruits.  CHEST:  Clear to auscultation and percussion.  CARDIAC:  Regular rate and rhythm.  Positive S4.  Normal S1 and S2 without murmurs, rubs, or S3.  ABDOMEN:  Soft and nontender without organomegaly.  EXTREMITIES:  No clubbing, cyanosis, or edema.  PERIPHERAL PULSES:  Femoral pulses 2+ without bruits.  Pedal pulses 2+ bilaterally.  LABORATORY DATA:  The EKG reveals sinus bradycardia at a rate of 54, otherwise normal EKG.  The chest x-ray is unremarkable.  Other laboratory data is as noted in the chart.  Most notably, the CK is 204, CK-MB 1.2, and troponin I 0.02.  ASSESSMENT AND PLAN: 1. Chest pain.  The patient presents with an episode of prolonged substernal  chest pain worrisome for unstable angina.  He is pain-free now and the EKG    is normal.  The patient will be treated with aspirin, intravenous heparin,    and intravenous nitroglycerin.  Cardiac enzymes will be cycled to rule out    myocardial infarction.  As he has resting bradycardia, will hold off on    beta blocker therapy at this time.  The plan at this point is to proceed    with cardiac catheterization when  the schedule allows if the patient    remains stable.  If he has recurrent unstable symptoms, catheterization    will need to be done sooner. 2. Hypertension.  The patient does have a history of labile hypertension which    he has attributed to anxiety.  His blood pressure will be followed in the    hospital.  We will institute antihypertensive therapy. 3. Cardiac risk factors.  Will check fasting lipids to rule out    hyperlipidemia. Dictated by:   Daisey Must, M.D. [facility] Attending Physician:  Daisey Must DD:  04/28/02 TD:  04/30/02 Job: [id number]

## 2011-04-10 NOTE — Cardiovascular Report (Signed)
NAME:  Corey Jordan, Corey Jordan NO.:  [id number]   MEDICAL RECORD NO.:  [id number]                   PATIENT TYPE:  INP   LOCATION:  3714                                 FACILITY:  [facility]   PHYSICIAN:  Charlies Constable, M.D.                  DATE OF BIRTH:  [birth year]   DATE OF PROCEDURE:  10/29/2003  DATE OF DISCHARGE:                              CARDIAC CATHETERIZATION   INDICATIONS:  Mr. Heber is 77 years old and was admitted on November 30  with large anterior wall infarction.  He underwent stenting in the proximal  LAD using distal protection with a filter wire as part of the FLAME  protocol.  He had recurrent pain over the weekend and was scheduled for  reevaluation with angiography.  He also had persistent ST elevation of 2 mm  in his precordial leads.   PROCEDURE:  The procedure was performed via the right femoral artery using  arterial sheath and 6-French preformed coronary catheters.  A front wall  arterial puncture was performed and Omnipaque contrast was used.  We did not  enter the left ventricle because of the large amount of left ventricular  dysfunction and concern about possible mural thrombus.  A distal angiogram  was performed to evaluate the right femoral artery since there was a widened  pulsation in this area.  The patient tolerated the procedure well and left  the laboratory in satisfactory condition.   RESULTS:  The aortic pressure was 128/74 with a mean of 98.   Left main coronary artery:  Free of significant disease.   Left anterior descending artery:  Gave rise to a diagonal branch, three  septal perforators, and a small second diagonal branch.  First diagonal  branch had 80% ostial stenosis, but good flow.  The tandem overlying stents  which were deployed just after the diagonal branch had less than 10%  narrowing.  There was 60% narrowing in the mid vessel.   Circumflex artery:  Gave rise to a small ramus branch, a marginal  branch, an  atrial branch, and two posterolateral branches.  These vessels were free of  significant disease.   Right coronary artery:  Moderate sized vessel.  Gave rise to a conus branch,  right ventricular branch, and a posterior descending branch.  There was 40%  narrowing in the proximal portion of this vessel.   DISTAL AORTOGRAM:  A distal aortogram was performed which showed patent  distal aorta and patent iliacs and there was no evidence of any false  aneurysm in the right femoral artery.  The study was too low to evaluate the  renals.   CONCLUSIONS:  Coronary artery disease status post recent anterior wall  myocardial infarction treated with stenting of the left anterior descending  with less than 10% narrowing at the stent site in the proximal left anterior  descending, 80% narrowing in the first diagonal branch,  and 60% narrowing in  the mid left anterior descending, no major obstruction of the circumflex  artery, and 40% narrowing in the proximal right coronary artery.   RECOMMENDATIONS:  There does not appear to be any source of ischemia.  I am  not certain regarding etiology of patient's recent chest pain but I doubt  that it is ischemic.  Will plan continued medical management of his  significant left ventricular dysfunction.  He has an ejection fraction of 30-  35% by echocardiogram and catheterization.  I will restart his Coumadin  tonight.                                               Charlies Constable, M.D.    BB/MEDQ  D:  10/29/2003  T:  10/30/2003  Job:  [id number]   cc:   Carole Binning, M.D. [facility] C. Andrey Campanile, M.D.  [address]  [place]  [place] [address]  Fax: [phone]   [facility]

## 2011-04-10 NOTE — Discharge Summary (Signed)
NAME:  TALON, REGALA NO.:  [id number]   MEDICAL RECORD NO.:  [id number]          PATIENT TYPE:  INP   LOCATION:  4707                         FACILITY:  [facility]   PHYSICIAN:  Micheline Chapman, MD   DATE OF BIRTH:  1934/04/30   DATE OF ADMISSION:  06/24/2006  DATE OF DISCHARGE:  06/25/2006                           DISCHARGE SUMMARY - REFERRING   DISCHARGE DIAGNOSES:  1.  Chest discomfort of uncertain etiology.  Small vessel coronary artery      disease.  2.  Ischemic cardiomyopathy.  3.  Hypertension.   History as noted below.   PROCEDURES PERFORMED:  Cardiac catheterization June 25, 2006 by Dr. Excell Seltzer.  Please refer to dictation.   SUMMARY HISTORY:  Mr. Fonda is a 77 year old white male who presented to  the emergency room with chest discomfort.  He states that since Sunday, June 11, 1997 he has been having episodes 2 to 3 times per day at rest and  associated with exertion with 3 to 4 retrosternal aching sensation  unassociated with symptoms.  These would last 10 minutes and resolved with a  sublingual nitroglycerin, and the worst episode occurred on Sunday to 29  which was similar to his myocardial infarction but very brief lasting a few  seconds, reoccurring discomfort on the morning of presentation.   PAST MEDICAL HISTORY:  Notable for prior anterior myocardial infarction  November 4 with stenting to the LAD with a Cypher stent.  Repeat  catheterization in January 2005 showed an EF of 41%, patent stent in the  LAD, otherwise nonobstructive coronary disease, ischemic cardiomyopathy with  EF of 25-35%, hyperlipidemia, DJD, status post hernia, labile hypertension .   LABORATORY:  Chest x-ray on admission showed cardiac enlargement, no acute  abnormalities.  Admission weight was 68.1 kg.  Admission H&H was 13.9 and  41.0, normal indices, platelets 165, WBCs 6.6.  Subsequent H&H was  unremarkable.  PTT 36, PT 13.5, INR 1.0.  Sodium 133, potassium 4.6,  BUN 17,  creatinine 1.3.  Subsequent chemistry was unremarkable.  LFTs on the  3respiratory distress were within normal limits.  CK-MBs and troponins x2  were negative for myocardial infarction.  Total cholesterol was 119,  triglycerides 69, HDL 60, LDL 45.  EKGs showed normal sinus rhythm, normal  axis, T-wave inversion, V1-V6.   HOSPITAL COURSE:  Mr. Birdsell was admitted to [facility] by Ward Givens and Dr. Gala Romney.  He was placed on IV heparin as well as his home  medications.  He underwent cardiac catheterization by Dr. Excell Seltzer.  According  to Dr. Earmon Phoenix progress note, his EF was 30%, akinetic apex and severe  hypokinesis in an anterior wall.  The RCA and circumflex were normal.  The  D2 filled late from collaterals.  He had an 80% diagonal 1 lesion which was  felt to be stable, and the LAD stent was widely patent.  The left main was  normal.  Given his single vessel coronary artery disease and severe ischemic  cardiomyopathy with an overall EF of 30%, Dr. Excell Seltzer felt that ICD should be  considered.  Prior to being discharged, Loura Pardon performed EP consultation  and arranged for outpatient follow-up with Dr. Ladona Ridgel for further  evaluation.  Post sheath removal and bedrest, the patient was ambulating  without difficulty, and catheterization site was intact, and it is felt that  Mr. Olden could be discharged home.  Prior to discharge, Loura Pardon spoke  with the family in regards to the risks and benefits of ICD.   DISPOSITION:  Mr. Gatliff is discharged home and asked to maintain low  salt, fat cholesterol diet.  His activities and wound care are per the  supplemental discharge instructions post catheterization.  He was asked to  weigh daily and bring all medicines and all weights to all appointments.  His new medications include Imdur 60 mg q.d. and Plavix 75 mg q.d.  He was  asked to increase his aspirin to 325 mg q.d.  His other medications include  Coreg 12.5 mg  b.i.d., Vytorin 40 mg q.h.s., Aldactone 12.5 mg q.d., Prevacid  30 mg q.d., Altace 10 mg daily and nitroglycerin 0.4 p.r.n. He will follow  up with Dr. Ladona Ridgel in the office on Wednesday, August 8 at 10:00 a.m.  He  will follow up with Dr. Daleen Squibb on July 20, 2006 at 3:30 p.m.  He will see  Dr. Penni Bombard as needed.      Joellyn Rued, P.A. [facility]      Micheline Chapman, MD  Electronically Signed    EW/MEDQ  D:  06/25/2006  T:  06/25/2006  Job:  [id number]   cc:   Penni Bombard, M.D.  Duke Salvia, M.D.

## 2011-04-10 NOTE — Cardiovascular Report (Signed)
NAME:  ROSEMARY, PENTECOST NO.:  [id number]   MEDICAL RECORD NO.:  [id number]          PATIENT TYPE:  INP   LOCATION:  4707                         FACILITY:  [facility]   PHYSICIAN:  Micheline Chapman, MD   DATE OF BIRTH:  October 26, 1934   DATE OF PROCEDURE:  06/25/2006  DATE OF DISCHARGE:                              CARDIAC CATHETERIZATION   PERFORMING PHYSICIAN:  Micheline Chapman, MD.   PROCTORING PHYSICIAN:  Rollene Rotunda, M.D.   INDICATION:  Mr. Schreur is a 77 year old man with known coronary artery  disease who is status post a prior anterior wall myocardial infarction in  2004.  He was treated with an LAD stent that time.  He presents with typical  crescendo angina, and therefore was referred for cardiac catheterization.   PROCEDURE:  1.  Left heart catheterization.  2.  Selective coronary angiography.  3.  Left ventricular angiography.  4.  Angio-Seal vascular closure device.   The risks and indications for the procedure were explained to the patient  who fully understood.  Informed consent was obtained.  The right groin was  prepped and draped in normal sterile fashion.  Intravenous Versed 1 mg was  given for conscious sedation.  The right groin was anesthetized with 1%  lidocaine.  Right femoral arterial access was obtained using the modified  Seldinger technique and a 6 French arterial sheath was placed.  Catheters  used included a 6 French angled pigtail, JR4, JL4.  All catheter exchanges  were performed over a guide wire.  At the conclusion of the case, the right  femoral artery was sealed with an Angio-Seal closure device.   COMPLICATIONS:  None.   FINDINGS:  Aortic pressure was 139/67 with a mean of 95, left ventricular  pressure was 130/3 with a end-diastolic pressure of 13.   Left coronary artery.  The left mainstem was normal.  It branched into the  LAD and left circumflex.  The left anterior descending has ostial  nonobstructive plaque disease.   There is a stent in the proximal LAD that is  widely patent.  The LAD gives off a first diagonal branch that has an 80%  ostial stenosis.  This was compared to his previous angiogram from 2005 and  is unchanged.  There is a second diagonal branch that is occluded and fills  late from left sided collaterals.  The remainder of the LAD has mild luminal  irregularities and courses around supplying the inferoapex.  The left  circumflex is a large diameter vessel that gives off two obtuse marginals  and ends in a large left posterolateral branch.  The left circumflex has no  angiographic disease.   Right coronary artery.  The right coronary artery is a dominant vessel.  It  gives off a PDA and a small posterior AV segment.  It has mild luminal  irregularities but has no significant obstructive disease.   LEFT VENTRICULOGRAM:  The RAO left ventriculogram demonstrates a large  anteroapical wall motion abnormality with severe hypokinesis of the  anterolateral wall and an akinetic apex as well as inferoapex.  The overall  left ventricular ejection fraction is estimated at approximately 30%.  There  is no appreciable mitral regurgitation.   ASSESSMENT:  1.  Single vessel coronary artery disease.  2.  Severe ischemic cardiomyopathy with overall left ventricular ejection      fraction of approximately 30%.   PLAN:  1.  To treat medically with the addition of long-acting nitrate therapy.  2.  Discussed consideration for an ICD for primary prevention of sudden      cardiac death and Mr. Vivanco will review this with his primary      cardiologist, Dr. Daleen Squibb.  3.  Patient will be discharged later today.      Micheline Chapman, MD  Electronically Signed     MDC/MEDQ  D:  06/25/2006  T:  06/25/2006  Job:  [id number]   cc:   Jesse Sans. Wall, M.D.  Quita Skye Artis Flock, M.D.

## 2011-04-10 NOTE — Cardiovascular Report (Signed)
[place]. [facility]  Patient:    Corey Jordan, Corey Jordan Visit Number: [id number] MRN: [id number]          Service Type: MED Location: 2000 2015 01 Attending Physician:  Daisey Must Dictated by:   Daisey Must, M.D. [facility] Proc. Date: 05/01/02 Admit Date:  04/28/2002 Discharge Date: 05/02/2002   CC:         Duffy Rhody C. Andrey Campanile, M.D.  [facility]   Cardiac Catheterization  PROCEDURE PERFORMED:  Left heart catheterization with coronary angiography and left ventriculography.  INDICATION:  The patient is a 77 year old male who was admitted to the hospital with substernal chest pain.  Initial EKG was unremarkable, however, second and third troponin levels were elevated; we therefore opted to proceed with cardiac catheterization.  PROCEDURAL NOTE:  A 6-French sheath was placed in the right femoral artery. Standard Judkins 6-French catheters were utilized.  Contrast was Omnipaque. At the conclusion of the procedure, a Duet vascular closure device was placed over the right femoral artery with good hemostasis.  There were no complications.  RESULTS:  Hemodynamics:  Left ventricular pressure 174/13.  Aortic pressure 176/84.  There was no aortic valve gradient.  Left ventriculogram:  Wall motion is normal, ejection fraction calculated at 63%.  There is no mitral regurgitation.  Coronary arteriography (right dominant): 1. Left main is normal. 2. Left anterior descending artery has a diffuse 30-40% stenosis in the    proximal-to-mid vessel.  The LAD gives rise to a normal-sized first    diagonal and normal-sized second diagonal.  Both first and second diagonals    have 40% stenoses proximally. 3. Left circumflex gives rise to a small ramus intermediate, small OM-1, small    OM-2 and a large OM-3.  There are minor luminal irregularities in the    distal circumflex. 4. Right coronary artery has minor luminal irregularities in the proximal  vessel.  The distal right coronary artery gives rise to a    normal-sized posterior descending artery and a small posterolateral branch.  IMPRESSIONS: 1. Normal left ventricular systolic function. 2. Mild coronary artery disease involving the proximal left anterior    descending artery.  This does not appear to be obstructive in nature.  PLAN:  The patient will be managed medically. Dictated by:   Daisey Must, M.D. [facility] Attending Physician:  Daisey Must DD:  05/01/02 TD:  05/03/02 Job: [id number]

## 2011-04-10 NOTE — Consult Note (Signed)
NAME:  BADR, PIEDRA NO.:  [id number]   MEDICAL RECORD NO.:  [id number]          PATIENT TYPE:  INP   LOCATION:  4707                         FACILITY:  [facility]   PHYSICIAN:  Maple Mirza, P.A. DATE OF BIRTH:  10-Mar-1934   DATE OF CONSULTATION:  DATE OF DISCHARGE:  06/25/2006                                   CONSULTATION   ALLERGIES:  THE PATIENT HAS NO KNOWN DRUG ALLERGIES.   HISTORY OF PRESENT ILLNESS:  [name] is a 77 year old male with  retrosternal chest pain for the last 6 days.  They lasted 10 minutes and  they resolved immediately with sublingual nitroglycerin.  His worst episode  was Sunday, June 20, 2006.  It was similar to the pain of his anterior  myocardial infarction in 2004 but it only lasted a couple of seconds.  He  came to the emergency room on August 2nd after a series of these episodes  but having had another dramatic episode of chest pain on admission.  Troponin I studies are less than 0.05 and 0.02 and then 0.02.   The patient had an anterior myocardial infarction in 2004.  He is status  post stent x2 to the proximal LAD after catheterization.  The ejection  fraction at that time was 30%.  He had a redo catheterization for crescendo  angina 2005 and then he had one today June 25, 2006.  His 2005 and 2007  catheterizations are mirror images of the original catheterization in that  the stents to the LAD are patent.  The first diagonal has an 80% osteal  stenosis, the second diagonal is occluded 100% but has left sided lateral  feed.  The left circumflex and the right coronary artery are normal.  He has  an anterior apical abnormalities.  There is a severe hypokinesis of the  anterolateral wall and on the apex is akinetic as well as the inferior apex.  A catheterization in 2005 the origin of the patient's angina is felt to be  subendocardial ischemia secondary to a dilated cardiomyopathy.  The plan  going forward from today is  to treat the patient medically with the addition  of long acting nitrates and to reinstitute Plavix.  He will discharge later  today.   Electrophysiology was called to discuss cardioverted defibrillator which the  patient has declined in the past.  After discussing it extensively with the  family and the wife, the stepson and the husband, it was agreed that he  would see Dr. Clide Cliff in the office in followup to plan for implant.  Cardiac  risk factors including prior history of myocardial infarction.  Cardiac risk  factors include dyslipidemia, hypertension.  The patient also has a past  medical history of degenerative joint disease and status post inguinal  herniorrhaphy.  Of note, the patient's congestive heart failure class is I-  II.  The patient prior to these episodes of crescendo angina walks 30  minutes a day with his wife and never really has any decrease in exercise  tolerance despite the ejection fraction of 30%.  Ejection fraction  after the  catheterization in 2004 in the presence of an anterior myocardial infarction  was 30%.  The ejection fraction at catheterization 2005 was 41%.  The  catheterization today June 25, 2006 30%.   MEDICATIONS:  1.  Coreg 12.5 mg b.i.d.  2.  Vitorin 10/40 at bedtime.  3.  Spironolactone 12.5 mg daily.  4.  Altace 10 mg daily.  5.  Prevacid 30 mg daily.  6.  Enteric coated aspirin 81 mg daily but this will be increased after      catheterization of 325 mg daily.  He will now start on Plavix 75 mg      daily.  This is the reinstituted dose for 1 year after stenting and he      will start on Imdur 60 mg also a new dose.  He also has sublingual      nitroglycerin as needed.   SOCIAL HISTORY:  The patient lives in [place] with his wife.  He is  retired from the [employer].  He never smokes.  Does not alcoholic  beverages and no recreational drugs.   FAMILY HISTORY:  Mother died at age 7 of pneumonia.  Father died at age 70  of  congestive heart failure.  He has 1 brother who is 46 and alive and well.  One brother died at age 66 of cancer.   REVIEW OF SYSTEMS:  The patient does not have any fever, chills, sweats,  weight change or adenopathy.  No hoarseness, no vertigo, no photophobia.  No  rashes, no nonhealing ulcerations to the lower extremities.  Cardiovascular:  Chest pain waxing and waning episodes of crescendo anginas were well treated  with sublingual nitroglycerin.  He does not get short of breath with  exertion.  He has no orthopnea, no paroxysmal nocturia dyspnea.  No  dependent edema.  He has no history of presyncope or syncope.  He is not  aware of palpitations.  The patient has no urinary problems, neuro or  psychiatric trouble such as numbness, weakness, depression or anxiety.  GI:  The patient has GERD, chronic heartburn, is on Prevacid.  Endocrine: No  history of diabetes or thyroid disease.  Musculoskeletal: Does have  degenerative joint disease in the knees and hips.  All other systems are  negative.   PHYSICAL EXAMINATION:  Vital signs: Temperature 98.2, pulse is 56 and  regular.  Electrocardiogram shows sinus bradycardia.  Blood pressure 124/72,  respirations 20, oxygen saturation 96% on room air.  General: He is alert and oriented x3 in no acute distress.  HEENT: Pupils equal, round and reactive to light.  Extraocular movements are  intact.  Sclerae are nonicteric.  Nares without discharge.  Neck: Supple, no carotid bruits auscultated.  No jugular venous distention.  No cervical lymphadenopathy.  Heart: Regular rate and rhythm.  No murmur.  Lungs: Clear to auscultation and percussion bilaterally.  Abdomen: Soft, nondistended, bowel sounds are present.  No  hepatosplenomegaly.  Extremities: Show no evidence of clubbing, cyanosis or edema.  Musculoskeletal: No obvious joint deformities.  Neurologic: Alert and oriented x3.  Cranial nerves II-XII grossly intact. No focal deficits.    LABORATORY DATA:  Serum electrolytes: Sodium 137, potassium 4.6, chloride  103, carbonate 32, BUN is 11, creatinine 1, glucose 104, SGOT 22, SGPT 21,  alkaline phosphatase 58.  Complete blood count white cells 6.6, hemoglobin  14, hematocrit 42.1, platelets 165.  Lipid profile cholesterol 119,  triglycerides 69, HDL cholesterol is 60, LDL cholesterol is  45.  Troponin I  studies 0.02, 0.02 and less than 0.05.   IMPRESSION:  1.  Ischemic cardiomyopathy, history of anterior myocardial infarction 2004.  2.  Status post stent x2 of the proximal LAD from catheterization 2004 in      setting of myocardial infarction.  3.  Ejection fraction 30% catheterization today.  4.  Catheterization today is a Metallurgist of catheterization 2005.  No new      findings.  First diagonal 80%, second diagonal 100% occluded but has      left sided collateral feed.  The stents in the LAD are patent.  The      patient has dyslipidemia, hypertension status post inguinal      herniorrhaphy, degenerative joint disease.  There is no prior history of      cerebrovascular accident, deep vein thrombosis or pulmonary embolism,      tachyarrhythmias.   PLAN:  The patient to have medical therapy to reinstitute Plavix 75 mg daily  to increase aspirin from 81 to 325 mg daily and to start Imdur 60 mg daily.  He also will followup with Dr. Johnna Acosta at [facility] for  consultation prior to ICD implant.  This appointment will be made at the  earliest possible times.      Maple Mirza, P.A.    GM/MEDQ  D:  06/25/2006  T:  06/26/2006  Job:  [id number]

## 2011-04-10 NOTE — Discharge Summary (Signed)
[place]. [facility]  Patient:    Corey Jordan, Corey Jordan Visit Number: [id number] MRN: [id number]          Service Type: MED Location: 2000 2015 01 Attending Physician:  Daisey Must Dictated by:   Lavella Hammock, P.A. Admit Date:  04/28/2002 Discharge Date: 05/02/2002   CC:         Duffy Rhody C. Andrey Campanile, M.D.   Referring Physician Discharge [place]  DATE OF BIRTH:  2034/06/15  PROCEDURES: 1. Cardiac catheterization. 2. Coronary arteriogram. 3. Left ventriculogram.  HISTORY OF PRESENT ILLNESS:  The patient is a 77 year old male with no known history of coronary artery disease who had no history of chest pain until the day of admission.  On the day of admission, he had anterior chest heaviness that was associated with left shoulder pain and pain that radiated into his left arm.  He came to the emergency room and was pain-free at the time of exam.  He was admitted to rule out MI and for further evaluation.  HOSPITAL COURSE:  He started on IV nitroglycerin and heparin and remained pain-free.  His enzymes were negative for MI and he had a cardiac catheterization performed on May 01, 2002.  The cardiac catheterization showed a normal left main and LAD with a 30-40% mid lesion.  The first two diagonals had 40% proximal stenoses.  The circumflex had less than 20% stenosis and the RCA had less than 20% stenosis. His EF was 63% with normal wall motion.  It was felt that he had nonobstructive coronary artery disease and medical therapy was recommended with risk factor reduction.  As part of his hospital work-up, the patient had a lipid profile drawn.  The lipid profile was within normal limits with a total cholesterol of 176, triglycerides 100, HDL 63, and LDL 93.  He was recommended to continue on a low-fat diet.  The patients blood pressure has been labile with a systolic pressure in the 170s at times and a diastolic pressure of greater than 100.  He had been  on Altace during his hospital stay, but it was felt that with a normal left ventricle, Hyzaar at 100/25 was an adequate medication for blood pressure control.  He was started on this prior to discharge.  The patient is to follow up with Duffy Rhody C. Wilson, M.D., within two weeks for a groin check and blood pressure check.  He was considered for discharge on May 02, 2002.  LABORATORY DATA:  Hemoglobin 12.7, hematocrit 38.2, WBC 5.5, platelets 155. Sodium 142, potassium 3.8, chloride 107, CO2 31, BUN 11, creatinine 1.1, glucose 98.  RADIOLOGY:  Chest x-ray performed on April 28, 2002:  ______ contacted, x-ray has not been read at this time.  Any abnormalities will be dictated in an addendum once the film has been read.  DISCHARGE CONDITION:  Stable.  DISCHARGE DIAGNOSES: 1. Chest pain, possibly secondary to hypertension. 2. Nonobstructive coronary artery disease by catheterization this admission. 3. Preserved left ventricular function. 4. Cholesterol profile within normal limits. 5. Status post lumbar laminectomy and inguinal hernia repair x 3. 6. History of frequent gastroesophageal reflux disease symptoms with Tums on a    daily basis recently.  ACTIVITY:  He is to do no driving or sexual or strenuous activity for two days.  DIET:  He is to stick to a low-fat diet.  WOUND CARE:  He is to call the office for problems with the catheterization site.  FOLLOW-UP:  He is to get a BMET  for potassium at the office visit.  He is to see [employer]. Wilson, M.D., within two weeks for a groin check and follow-up on his blood pressure and potassium.  DIET:  He is encourage to eat bananas and drink orange juice daily.  DISCHARGE MEDICATIONS: 1. Coated aspirin 81 mg q.d. 2. Hyzaar 100/25 mg q.d. 3. Prevacid 30 mg q.d. Dictated by:   Lavella Hammock, P.A. Attending Physician:  Daisey Must DD:  05/02/02 TD:  05/02/02 Job: [id number]

## 2011-04-10 NOTE — Discharge Summary (Signed)
NAME:  LJ, MIYAMOTO NO.:  [id number]   MEDICAL RECORD NO.:  [id number]                   PATIENT TYPE:  INP   LOCATION:  3712                                 FACILITY:  [facility]   PHYSICIAN:  Rollene Rotunda, M.D.                DATE OF BIRTH:  11-10-1934   DATE OF ADMISSION:  06/11/2004  DATE OF DISCHARGE:  06/14/2004                                 DISCHARGE SUMMARY   DISCHARGE DIAGNOSES:  1. Coronary artery disease.     a. History of anterior myocardial infarction, November 2004, treated with        CYPHER stent times two to the left anterior descending artery.     b. Catheterization this admission with stable anatomy, patent left        anterior descending artery stent - medical therapy.  2. Ischemic cardiomyopathy with a history of an ejection fraction of 30%,     improved to 41% by catheterization this admission.  3. Coumadin therapy secondary to history of apical infarct.  4. Dyslipidemia, treated.  5. History of degenerative disk disease.  6. History of hernia surgery.   PROCEDURES PERFORMED THIS ADMISSION:  Cardiac catheterization by Dr. Randa Evens on June 13, 2004.  Please see his dictated note for complete details.  The patient's only significant stenosis was in a small first diagonal.  This  was unchanged from catheterization in December 2004.  His left anterior  descending artery stent was patent.  Dr. Samule Ohm questioned whether or not  chest pain could be secondary to subendocardial ischemia secondary to  dilation.   HOSPITAL COURSE:  Please see the admission history and physical for complete  details from June 11, 2004.  Briefly, this 77 year old male patient  presented with chest pain worrisome for unstable angina.  He ruled out for  myocardial infarction by enzymes.  His Coumadin was held.  He was given  vitamin K to reverse his Coumadin.  His telemetry revealed sinus  bradycardia, and his Coreg was decreased to 6.25 mg twice a  day.  He  underwent catheterization, as noted above, by Dr. Samule Ohm, on June 13, 2004.  As noted above, Dr. Samule Ohm felt the patient's Altace should be increased and  this was increased to 2.5 mg b.i.d.  On the morning of June 14, 2004, the  patient was in a stable condition.  His right groin was without hematoma or  bruits.  His Coumadin had been restarted on the evening of June 13, 2004.  His INR at discharge was 1.1.   LABS:  White count 5,600, hemoglobin 13.1, hematocrit 38.2, platelet count  150,000.  INR at discharge 1.1.  Sodium 135, potassium 4, chloride 102, CO2  27, glucose 97, BUN 16, creatinine 1.2, calcium 9.0.  On admission, LFTs  normal.  Cardiac enzymes normal.  Total cholesterol 115, triglycerides 52,  HDL 53, LDL 52.   On admission, chest  x-ray showed mild cardiomegaly.  No evidence of active  chest disease radiographically.   DISCHARGE MEDICATIONS:  1. Altace 2.5 mg b.i.d.  2. Plavix 75 mg daily.  3. Prevacid as taken previously.  4. Vytorin 10/40 mg daily.  5. Coreg 6.25 mg b.i.d.  6. Coumadin as taken previously (the patient was on 5 mg Saturday, Sunday,     Tuesday, Thursday, and 2.5 mg Monday, Wednesday Friday).  7. Aspirin 81 mg daily.  8. Nitroglycerin p.r.n. chest pain.   PAIN MANAGEMENT:  1. Tylenol as needed.  2. Nitroglycerin as needed for chest pain.   He is to call our office or 911 with recurrent chest pain.   ACTIVITY:  No driving, heavy lifting, exertion, or sex for three days.   DIET:  Low-fat, low-sodium.   WOUND CARE:  The patient is to call the office __________  bruising.   FOLLOW UP:  1. The patient should be seen in the Coumadin Clinic next Monday or Tuesday,     July 25th or July 26th, for a followup on his PT and INR.  2. He will need to follow up with Dr. Daleen Squibb in the next two weeks.  The     patient notes that he has an appointment on August 4th, and he should     keep that appointment.  3. He will need to see his primary care  physician, Dr. Andrey Campanile, in the next 2-     4 weeks.  He should call for an appointment.      Tereso Newcomer, P.A.                        Rollene Rotunda, M.D.    SW/MEDQ  D:  06/14/2004  T:  06/15/2004  Job:  [id number]   cc:   [place]. Andrey Campanile, M.D.  [address]  [place]  [place] [address]  Fax: [phone]   Jesse Sans. Wall, M.D.

## 2011-04-10 NOTE — Cardiovascular Report (Signed)
NAME:  GERRAD, WELKER NO.:  [id number]   MEDICAL RECORD NO.:  [id number]                   PATIENT TYPE:  INP   LOCATION:  3712                                 FACILITY:  [facility]   PHYSICIAN:  Salvadore Farber, M.D. [facility]         DATE OF BIRTH:  June 16, 1934   DATE OF PROCEDURE:  DATE OF DISCHARGE:  06/14/2004                              CARDIAC CATHETERIZATION   PROCEDURE:  Left-heart catheterization, left ventriculography, coronary  angiography.   INDICATION:  Mr. Bonura is a 77 year old gentleman status post anterior  myocardial infarction in November of 2004, treated with drug-eluting stent  placement in the mid LAD.  He has an ischemic cardiomyopathy with EF  previously estimated at approximately 30%.  He now presents with chest  discomfort concerning for unstable angina and is scheduled for cardiac  catheterization.   PROCEDURAL TECHNIQUE:  Informed consent was obtained.  Under 1% lidocaine  local anesthesia, a 6-French sheath was placed in the right common femoral  artery using the modified Seldinger technique.  Diagnostic angiography and  ventriculography were performed using JL-4, JR-4 and pigtail catheters.  The  patient tolerated the procedure well and was transferred to the holding room  in stable condition.  Sheaths will be removed there.   COMPLICATIONS:  None.   FINDINGS:  1. LV:  157/11/20.  The left ventricle is dilated.  The ejection fraction is     41% with anterolateral and apical akinesis.  There is a probable apical     aneurysm.  2. No aortic stenosis or mitral regurgitation.  3. Left main:  Angiographically normal.  4. LAD:  The LAD is a moderate-sized vessel.  The stent in the mid vessel is     widely patent with no restenosis.  The small first diagonal branch has an     ostial 80% stenosis which is unchanged from December of 2004.  The ramus     intermedius is relatively small and is angiographically normal.  5.  Circumflex:  A large vessel giving rise to three obtuse marginals.  It is     angiographically normal.  6. RCA:  A relatively small but dominant vessel.  It is angiographically     normal.   IMPRESSION/RECOMMENDATION:  His only significant stenosis is at the small  first-diagonal branch.  This is unchanged compared with the study of  December of 2004.  His chest pain may represent subendocardial ischemia due  to his dilated cardiomyopathy.  We will plan on increased ACE inhibitor.  We  will resume Coumadin today.                                               Salvadore Farber, M.D. [facility]    WED/MEDQ  D:  06/13/2004  T:  06/15/2004  Job:  [id number]   cc:   Thomas C. Wall, M.D.   Stanley C. Andrey Campanile, M.D.  [address]  [place]  [place] [address]  Fax: [phone]
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
## 2011-04-10 NOTE — H&P (Signed)
NAME:  Corey Jordan, Corey Jordan NO.:  [id number]   MEDICAL RECORD NO.:  [id number]          PATIENT TYPE:  INP   LOCATION:  1830                         FACILITY:  [facility]   PHYSICIAN:  Arvilla Meres, M.D. LHCDATE OF BIRTH:  04-12-1934   DATE OF ADMISSION:  06/24/2006  DATE OF DISCHARGE:                                HISTORY & PHYSICAL   PRIMARY CARDIOLOGIST:  Valera Castle, M.D.   PRIMARY CARE PHYSICIAN:  Stanley C. Andrey Campanile, M.D.   PATIENT PROFILE:  [age]-year-old white male with prior history of CAD who  presents to the ER with unstable angina.   PROBLEMS:  1.  Unable angina/coronary artery disease.      1.  September 26, 2003, status post anterior wall MI with PCI and stenting          of the LAD with a 2.5 x 18-mm cipher drug-eluding stent and a 3 x 8-          mm cipher drug-eluding stent.      2.  December 15, 2003, cardiac catheterization, EF 41%, patent stents in          the LAD with a small first diagonal with an 8% ostial stenosis which          was present in 2004.  Otherwise, nonobstructive disease.  2.  Ischemic cardiomyopathy, EF 25% to 35%.  3.  Hyperlipidemia.  4.  Degenerative disk disease.  5.  History of hernia, status post repair.  6.  Labile hypertension.   HISTORY OF PRESENT ILLNESS:  [age]-year-old married white male with  history of CAD, status post anterior ST elevation MI with cipher drug-  eluding stent x 2 to the LAD in November 2004, who was very active without  limitation until this past week.  Since Sunday, July 29, he has been having  daily (two to three times per day) rest and exertional 3 to 4/10  retrosternal aching without associated symptoms, lasting approximately 10  minutes and resolving immediately with sublingual nitroglycerin.  The worst  episode occurred Sunday, July 29, which was squeezing in nature and similar  to his MI pain but very brief, lasting just a couple of seconds.  He had  recurrent chest pain this morning  that was completely resolved with  sublingual nitroglycerin and he presented to the ED for further evaluation.   ALLERGIES:  No known drug allergies.   HOME MEDICATIONS:  1.  Coreg 12.5 mg b.i.d.  2.  Vytorin 10/40 mg daily.  3.  Spironolactone 12.5 mg daily.  4.  Altace 10 mg daily.  5.  Prevacid 30 mg b.i.d.  6.  Aspirin 81 mg daily.   FAMILY HISTORY:  Mother died of pneumonia at age 36.  Father died of heart  failure at age 36.  He has a 33 year old brother who is alive and well and  had another brother who died at age 88 of cancer.   SOCIAL HISTORY:  He lives with [place] with his wife.  He is retired from  the post office.  He is married and has two grown sons.  He denies any  tobacco, alcohol, or drug use.  He walks approximately 30 minutes daily but  has done much less walking this past week as a result of these symptoms.   REVIEW OF SYSTEMS:  Positive for chest pain as outlined in the HPI, as well  as slight dyspnea, both at rest and with exertion.  Otherwise, all systems  are reviewed and negative.   PHYSICAL EXAMINATION:  VITAL SIGNS:  Temperature 98.2, heart rate 68,  respirations 18, blood pressure 160/74.  Pulse ox is 97% on room air.  GENERAL:  Pleasant white male in no acute distress.  Awake, alert and  oriented x 3.  NECK:  Normal carotid upstrokes.  No bruits or JVD.  LUNGS:  Air spaces regular and unlabored.  Clear to auscultation.  CARDIAC:  Regular S1 and S2.  No S3 or S4.  No murmurs.  ABDOMEN:  Benign, soft, nontender, nondistended.  Bowel sounds present x 4.  EXTREMITIES:  Warm, dry, pink.  No clubbing, cyanosis or edema.  Dorsalis  pedis and posterior tibial pulses 2+ and equal bilaterally.   LABORATORY DATA:  Chest x-ray is pending.  EKG shows sinus brady with a rate  of 58, normal axis, T-wave inversion in leads aVL, V2, V3, V4.  Lab work is  pending.   ASSESSMENT AND PLAN:  1.  Unstable angina/coronary artery disease.  The patient presents with  a      one-week history of rest and exertional angina that resolved immediately      with sublingual nitroglycerin.  Plan to admit.  Cycle cardiac marks and      plan cardiac catheterization this afternoon.  Continue aspirin, statin,      beta blocker, and ACE inhibitor.  2.  Ischemic cardiomyopathy.  He is currently euvolemic.  Continue aspirin,      beta blocker, ACE inhibitor, as well as spironolactone.  The patient      will undergo cardiac catheterization on this admission and following      cath, he will be considered for an ICB given low EF.  3.  Hypertension.  Blood pressure is currently elevated.  The patient      reports labile blood pressure in the past, especially during doctor      visits.  Will continue to follow and titrate Coreg if necessary,      although his heart rate runs in the upper 50s to 60s.  4.  Hyperlipidemia.  Check lipids and LFTs.  Continue Vytorin.      Ok Anis, NP      Arvilla Meres, M.D. [facility]  Electronically Signed    CRB/MEDQ  D:  06/24/2006  T:  06/24/2006  Job:  [id number]

## 2011-04-10 NOTE — Assessment & Plan Note (Signed)
[facility]                         ELECTROPHYSIOLOGY OFFICE NOTE   AJAY, STRUBEL                      MRN:          [id number]  DATE:03/10/2007                            DOB:          1934-10-30    Mr. Baca was seen today in the clinic on March 10, 2007 for a  followup of his Medtronic model [phone] Maximo.  Date of implant was  July 06, 2006 for ischemic cardiomyopathy.  On interrogation of his  device today, his battery voltage is 3.20 with a charge time of 7.59  seconds.  R waves measured 17.9 mV with a ventricular pacing threshold  of 1 volt at 0.2 ms and a ventricular lead impedence of 752 ohm.  Shock  impedence was 52.  There were no episodes since last interrogation.  No  changes were made in his parameters.  He will be seen again in the  clinic in four months.      Altha Harm, LPN  Electronically Signed      Doylene Canning. Ladona Ridgel, MD  Electronically Signed   PO/MedQ  DD: 03/10/2007  DT: 03/10/2007  Job #: [id number]

## 2011-04-16 ENCOUNTER — Encounter: Payer: Self-pay | Admitting: Cardiology

## 2011-04-22 ENCOUNTER — Ambulatory Visit (INDEPENDENT_AMBULATORY_CARE_PROVIDER_SITE_OTHER): Payer: Medicare Other | Admitting: Cardiology

## 2011-04-22 ENCOUNTER — Encounter: Payer: Self-pay | Admitting: Cardiology

## 2011-04-22 VITALS — BP 132/73 | HR 60 | Resp 12 | Ht 68.0 in | Wt 151.0 lb

## 2011-04-22 DIAGNOSIS — I2510 Atherosclerotic heart disease of native coronary artery without angina pectoris: Secondary | ICD-10-CM

## 2011-04-22 DIAGNOSIS — E785 Hyperlipidemia, unspecified: Secondary | ICD-10-CM

## 2011-04-22 NOTE — Assessment & Plan Note (Signed)
He is doing well with no current symptoms or manifestations of cardiac decompensation. Medications reviewed and renewed.Corey Jordan see him back in 6 months.

## 2011-04-22 NOTE — Progress Notes (Signed)
HPI Corey Jordan comes in today for close followup of his coronary artery disease and history of a large anterior Corey Jordan infarct. He is extremely good about taking his medications and salt monitoring. His weight has been stable. His blood pressures have been all good and I have reviewed his blood pressure log.  He denies any angina and is not taking any sublingual nitroglycerin. He had a recent physical but did not have lipids assessed. We have not checked in several years. He  He denies orthopnea, PND, edema, or palpitations. His defibrillator has not fired.  EKG today shows sinus rhythm with no significant changes.  Past Medical History  Diagnosis Date  . Coronary artery disease   . Myocardial infarction, inferior Ellarae Nevitt   . Cardiac defibrillator in situ     automatic implantable  . Hyperlipidemia   . Hypertension   . Ischemic cardiomyopathy   . CHF (congestive heart failure)   . History of orthostatic hypotension   . Degenerative joint disease   . Hyperthyroidism     No past surgical history on file.  No family history on file.  History   Social History  . Marital Status: Married    Spouse Name: [name]    Number of Children: [name]  . Years of Education: [name]   Occupational History  . Not on file.   Social History Main Topics  . Smoking status: Never Smoker   . Smokeless tobacco: Not on file  . Alcohol Use: Not on file  . Drug Use: Not on file  . Sexually Active: Not on file   Other Topics Concern  . Not on file   Social History Narrative  . No narrative on file    No Known Allergies  Current Outpatient Prescriptions  Medication Sig Dispense Refill  . aspirin 81 MG EC tablet Take 81 mg by mouth daily.        . calcium-vitamin D (OSCAL WITH D) 500-200 MG-UNIT per tablet Take 1 tablet by mouth 2 (two) times daily.        . carvedilol (COREG) 6.25 MG tablet Take 6.25 mg by mouth 2 (two) times daily with a meal.        . clopidogrel (PLAVIX) 75 MG tablet Take 75 mg by  mouth daily.        Marland Kitchen ezetimibe-simvastatin (VYTORIN) 10-40 MG per tablet Take 1 tablet by mouth at bedtime.  90 tablet  3  . levothyroxine (SYNTHROID, LEVOTHROID) 50 MCG tablet Take 50 mcg by mouth daily.        . nitroGLYCERIN (NITROSTAT) 0.4 MG SL tablet Place 0.4 mg under the tongue every 5 (five) minutes as needed.        Marland Kitchen omeprazole (PRILOSEC) 20 MG capsule Take 20 mg by mouth daily.        . ramipril (ALTACE) 2.5 MG capsule Take 2.5 mg by mouth daily.        Marland Kitchen spironolactone (ALDACTONE) 25 MG tablet Take 1/2 (half) tablet daily       . DISCONTD: spironolactone (ALDACTONE) 25 MG tablet Take 1 tablet (25 mg total) by mouth daily. Take 1/2 (half) tablet daily  45 tablet  3    ROS Negative other than HPI.   PE General Appearance: well developed, well nourished in no acute distress HEENT: symmetrical face, PERRLA, good dentition  Neck: no JVD, thyromegaly, or adenopathy, trachea midline Chest: symmetric without deformity Cardiac: PMI non-displaced, RRR, normal S1, S2, no gallop or murmur Lung: clear to ausculation  and percussion Vascular: all pulses full without bruits  Abdominal: nondistended, nontender, good bowel sounds, no HSM, no bruits Extremities: no cyanosis, clubbing or edema, no sign of DVT, no varicosities  Skin: normal color, no rashes Neuro: alert and oriented x 3, non-focal Pysch: normal affect  Filed Vitals:   04/22/11 1355  BP: 132/73  Pulse: 60  Resp: 12  Height: 5\' 8"  (1.727 m)  Weight: 151 lb (68.493 kg)    EKG  Labs and Studies Reviewed.   Lab Results  Component Value Date   WBC 6.0 12/06/2009   HGB 13.9 12/06/2009   HCT 40.7 12/06/2009   MCV 92.9 12/06/2009   PLT 177 12/06/2009      Chemistry      Component Value Date/Time   NA 138 12/06/2009 1325   K 4.6 12/06/2009 1325   CL 102 12/06/2009 1325   CO2 30 12/06/2009 1325   BUN 15 12/06/2009 1325   CREATININE 0.94 12/06/2009 1325      Component Value Date/Time   CALCIUM 9.5 12/06/2009 1325    ALKPHOS 75 12/06/2009 1325   AST 21 12/06/2009 1325   ALT 17 12/06/2009 1325   BILITOT 0.8 12/06/2009 1325       No results found for this basename: CHOL   No results found for this basename: HDL   No results found for this basename: LDLCALC   No results found for this basename: TRIG   No results found for this basename: CHOLHDL   No results found for this basename: HGBA1C   Lab Results  Component Value Date   ALT 17 12/06/2009   AST 21 12/06/2009   ALKPHOS 75 12/06/2009   BILITOT 0.8 12/06/2009   Lab Results  Component Date   TSH 01/02/2009

## 2011-04-22 NOTE — Patient Instructions (Signed)
Your physician recommends that you return for lab work in: fasting cholesterol.  Please call office when you would like to schedule an appointment.  Your physician recommends that you schedule a follow-up appointment in: 6 months with Dr. Daleen Squibb

## 2011-04-23 ENCOUNTER — Other Ambulatory Visit (INDEPENDENT_AMBULATORY_CARE_PROVIDER_SITE_OTHER): Payer: Medicare Other | Admitting: *Deleted

## 2011-04-23 DIAGNOSIS — E785 Hyperlipidemia, unspecified: Secondary | ICD-10-CM

## 2011-04-23 DIAGNOSIS — I2510 Atherosclerotic heart disease of native coronary artery without angina pectoris: Secondary | ICD-10-CM

## 2011-04-23 LAB — LIPID PANEL
Cholesterol: 128 mg/dL (ref 0–200)
Triglycerides: 62 mg/dL (ref 0.0–149.0)
VLDL: 12.4 mg/dL (ref 0.0–40.0)

## 2011-04-28 ENCOUNTER — Encounter: Payer: Self-pay | Admitting: Cardiology

## 2011-05-01 ENCOUNTER — Encounter: Payer: Self-pay | Admitting: Cardiology

## 2011-05-19 ENCOUNTER — Ambulatory Visit (INDEPENDENT_AMBULATORY_CARE_PROVIDER_SITE_OTHER): Payer: Medicare Other | Admitting: Internal Medicine

## 2011-05-19 ENCOUNTER — Encounter: Payer: Self-pay | Admitting: Internal Medicine

## 2011-05-19 DIAGNOSIS — I2589 Other forms of chronic ischemic heart disease: Secondary | ICD-10-CM

## 2011-05-19 DIAGNOSIS — Z95810 Presence of automatic (implantable) cardiac defibrillator: Secondary | ICD-10-CM

## 2011-05-19 DIAGNOSIS — I509 Heart failure, unspecified: Secondary | ICD-10-CM

## 2011-05-19 DIAGNOSIS — I428 Other cardiomyopathies: Secondary | ICD-10-CM

## 2011-05-19 NOTE — Assessment & Plan Note (Signed)
He denies anginal symptoms. I've asked the continue his regular activities including walking. No change in medications today.

## 2011-05-19 NOTE — Progress Notes (Signed)
HPI Corey Jordan returns today for followup. He is a 77 year old man with an ischemic cardiomyopathy, class II systolic heart failure (chronic), who is status post ICD implantation. The patient denies chest pain, shortness of breath, or peripheral edema. No Known Allergies   Current Outpatient Prescriptions  Medication Sig Dispense Refill  . aspirin 81 MG EC tablet Take 81 mg by mouth daily.        . calcium-vitamin D (OSCAL WITH D) 500-200 MG-UNIT per tablet Take 1 tablet by mouth 2 (two) times daily.        . carvedilol (COREG) 6.25 MG tablet Take 6.25 mg by mouth 2 (two) times daily with a meal.        . clopidogrel (PLAVIX) 75 MG tablet Take 75 mg by mouth daily.        Marland Kitchen ezetimibe-simvastatin (VYTORIN) 10-40 MG per tablet Take 1 tablet by mouth at bedtime.  90 tablet  3  . levothyroxine (SYNTHROID, LEVOTHROID) 50 MCG tablet Take 50 mcg by mouth daily.        . nitroGLYCERIN (NITROSTAT) 0.4 MG SL tablet Place 0.4 mg under the tongue every 5 (five) minutes as needed.        Marland Kitchen omeprazole (PRILOSEC) 20 MG capsule Take 20 mg by mouth daily.        . ramipril (ALTACE) 2.5 MG capsule Take 2.5 mg by mouth daily.        Marland Kitchen spironolactone (ALDACTONE) 25 MG tablet Take 1/2 (half) tablet daily          Past Medical History  Diagnosis Date  . Coronary artery disease   . Myocardial infarction, inferior wall   . Cardiac defibrillator in situ     automatic implantable  . Hyperlipidemia   . Hypertension   . Ischemic cardiomyopathy   . CHF (congestive heart failure)   . History of orthostatic hypotension   . Degenerative joint disease   . Hyperthyroidism     ROS:   All systems reviewed and negative except as noted in the HPI.   No past surgical history on file.   No family history on file.   History   Social History  . Marital Status: Married    Spouse Name: [name]    Number of Children: [name]  . Years of Education: [name]   Occupational History  . Not on file.   Social History  Main Topics  . Smoking status: Never Smoker   . Smokeless tobacco: Not on file  . Alcohol Use: Not on file  . Drug Use: Not on file  . Sexually Active: Not on file   Other Topics Concern  . Not on file   Social History Narrative  . No narrative on file     BP 161/79  Pulse 61  Ht 5\' 8"  (1.727 m)  Wt 155 lb (70.308 kg)  BMI 23.57 kg/m2  Physical Exam: BP 135/72 on my exam Well appearing NAD HEENT: Unremarkable Neck:  No JVD, no thyromegally Lymphatics:  No adenopathy Back:  No CVA tenderness Lungs:  Clear., well-healed ICD incision HEART:  Regular rate rhythm, no murmurs, no rubs, no clicks Abd:  Flat, positive bowel sounds, no organomegally, no rebound, no guarding Ext:  2 plus pulses, no edema, no cyanosis, no clubbing Skin:  No rashes no nodules Neuro:  CN II through XII intact, motor grossly intact  DEVICE  Normal device function.  See PaceArt for details.   Assess/Plan:

## 2011-05-19 NOTE — Assessment & Plan Note (Signed)
His chronic systolic heart failure is well compensated. I've asked that he continue his medical therapy and maintain a low-sodium diet.

## 2011-05-19 NOTE — Assessment & Plan Note (Signed)
His device is working normally. We'll recheck in several months. 

## 2011-05-19 NOTE — Patient Instructions (Signed)
Your physician recommends that you schedule a follow-up appointment in: 3 months with device clinic and 12 months with Dr Taylor  

## 2011-07-30 ENCOUNTER — Other Ambulatory Visit: Payer: Self-pay | Admitting: *Deleted

## 2011-07-30 MED ORDER — CLOPIDOGREL BISULFATE 75 MG PO TABS: 75.0000 mg | ORAL_TABLET | Freq: Every day | ORAL | Status: DC

## 2011-08-11 ENCOUNTER — Telehealth: Payer: Self-pay | Admitting: Cardiology

## 2011-08-11 MED ORDER — CLOPIDOGREL BISULFATE 75 MG PO TABS: 75.0000 mg | ORAL_TABLET | Freq: Every day | ORAL | Status: DC

## 2011-08-11 NOTE — Telephone Encounter (Signed)
Clopidogrel refill called into [facility] for 30 days, pt requested 90 days, med already received, pt's wife alling to see if another 60 days can be called in?

## 2011-08-13 ENCOUNTER — Telehealth: Payer: Self-pay | Admitting: Cardiology

## 2011-08-13 NOTE — Telephone Encounter (Signed)
Pt called on tues about 90 day supply clopodogrel [facility]. He hasnt heard anything. Please call back

## 2011-08-14 MED ORDER — CLOPIDOGREL BISULFATE 75 MG PO TABS: 75.0000 mg | ORAL_TABLET | Freq: Every day | ORAL | Status: DC

## 2011-08-26 ENCOUNTER — Encounter: Payer: Self-pay | Admitting: Internal Medicine

## 2011-08-26 ENCOUNTER — Ambulatory Visit (INDEPENDENT_AMBULATORY_CARE_PROVIDER_SITE_OTHER): Payer: Medicare Other | Admitting: *Deleted

## 2011-08-26 DIAGNOSIS — I2589 Other forms of chronic ischemic heart disease: Secondary | ICD-10-CM

## 2011-08-27 ENCOUNTER — Other Ambulatory Visit: Payer: Self-pay | Admitting: *Deleted

## 2011-08-27 ENCOUNTER — Telehealth: Payer: Self-pay

## 2011-08-27 MED ORDER — RAMIPRIL 2.5 MG PO CAPS: 2.5000 mg | ORAL_CAPSULE | Freq: Every day | ORAL | Status: DC

## 2011-08-27 MED ORDER — CARVEDILOL 6.25 MG PO TABS: 6.2500 mg | ORAL_TABLET | Freq: Two times a day (BID) | ORAL | Status: DC

## 2011-08-27 NOTE — Telephone Encounter (Signed)
Refill sent to [facility] for ramipril 2.5 mg take one tablet daily.

## 2011-09-03 LAB — CBC
HCT: 41.5
Hemoglobin: 13.9
MCHC: 33.5
MCV: 90.8
Platelets: 177
RBC: 4.57
RDW: 13.2
WBC: 5.4

## 2011-09-03 LAB — DIFFERENTIAL
Basophils Absolute: 0
Basophils Relative: 1
Eosinophils Absolute: 0.1
Eosinophils Relative: 2
Lymphocytes Relative: 23
Lymphs Abs: 1.2
Monocytes Absolute: 0.5
Monocytes Relative: 10
Neutro Abs: 3.5
Neutrophils Relative %: 64

## 2011-09-03 LAB — URINALYSIS, ROUTINE W REFLEX MICROSCOPIC
Hgb urine dipstick: NEGATIVE
Nitrite: NEGATIVE
Protein, ur: NEGATIVE
Specific Gravity, Urine: 1.003 — ABNORMAL LOW
Urobilinogen, UA: 1

## 2011-09-03 LAB — I-STAT 8, (EC8 V) (CONVERTED LAB)
Acid-Base Excess: 4 — ABNORMAL HIGH
Chloride: 104
HCT: 43
Hemoglobin: 14.6
Potassium: 4.4
Sodium: 139
TCO2: 33

## 2011-09-03 LAB — POCT I-STAT CREATININE
Creatinine, Ser: 1.2
Operator id: 151321

## 2011-09-09 LAB — I-STAT 8, (EC8 V) (CONVERTED LAB)
Acid-Base Excess: 2
Chloride: 104
Hemoglobin: 13.9
Potassium: 4.4
Sodium: 138
TCO2: 30

## 2011-09-09 LAB — BASIC METABOLIC PANEL
BUN: 14
Creatinine, Ser: 1.09
GFR calc Af Amer: >60
GFR calc non Af Amer: >60
Potassium: 4.2

## 2011-09-09 LAB — CK TOTAL AND CKMB (NOT AT ARMC)
CK, MB: 1.8
Total CK: 86

## 2011-09-09 LAB — URINALYSIS, ROUTINE W REFLEX MICROSCOPIC
Bilirubin Urine: NEGATIVE
Ketones, ur: NEGATIVE
Nitrite: NEGATIVE
Protein, ur: NEGATIVE
Urobilinogen, UA: 0.2

## 2011-09-09 LAB — CARDIAC PANEL(CRET KIN+CKTOT+MB+TROPI)
CK, MB: 1.8
Relative Index: INVALID
Troponin I: 0.02

## 2011-09-09 LAB — POCT CARDIAC MARKERS
CKMB, poc: <1 — ABNORMAL LOW
Operator id: 146091

## 2011-09-09 LAB — TSH: TSH: 8.656 — ABNORMAL HIGH

## 2011-09-09 LAB — CBC
HCT: 36.2 — ABNORMAL LOW
MCHC: 33.4
MCV: 90.6
Platelets: 141 — ABNORMAL LOW
Platelets: 176
RBC: 4.47
RDW: 13.4
WBC: 6.1

## 2011-09-09 LAB — POCT I-STAT CREATININE
Creatinine, Ser: 1.2
Operator id: 146091

## 2011-09-09 LAB — TROPONIN I: Troponin I: 0.02

## 2011-09-09 LAB — PROTIME-INR
INR: 1
Prothrombin Time: 12.9
Prothrombin Time: 13.3

## 2011-09-09 LAB — LIPID PANEL
Triglycerides: 61
VLDL: 12

## 2011-09-09 LAB — MAGNESIUM: Magnesium: 2.2

## 2011-09-18 ENCOUNTER — Ambulatory Visit (INDEPENDENT_AMBULATORY_CARE_PROVIDER_SITE_OTHER): Payer: Medicare Other | Admitting: Internal Medicine

## 2011-09-18 ENCOUNTER — Encounter: Payer: Self-pay | Admitting: Internal Medicine

## 2011-09-18 VITALS — BP 150/84 | HR 68 | Temp 98.5°F | Ht 67.0 in | Wt 153.0 lb

## 2011-09-18 DIAGNOSIS — K219 Gastro-esophageal reflux disease without esophagitis: Secondary | ICD-10-CM

## 2011-09-18 DIAGNOSIS — M519 Unspecified thoracic, thoracolumbar and lumbosacral intervertebral disc disorder: Secondary | ICD-10-CM

## 2011-09-18 DIAGNOSIS — Z23 Encounter for immunization: Secondary | ICD-10-CM

## 2011-09-18 DIAGNOSIS — E039 Hypothyroidism, unspecified: Secondary | ICD-10-CM

## 2011-09-19 ENCOUNTER — Encounter: Payer: Self-pay | Admitting: Internal Medicine

## 2011-09-19 DIAGNOSIS — E039 Hypothyroidism, unspecified: Secondary | ICD-10-CM | POA: Insufficient documentation

## 2011-09-19 DIAGNOSIS — K219 Gastro-esophageal reflux disease without esophagitis: Secondary | ICD-10-CM | POA: Insufficient documentation

## 2011-09-19 DIAGNOSIS — M519 Unspecified thoracic, thoracolumbar and lumbosacral intervertebral disc disorder: Secondary | ICD-10-CM

## 2011-09-19 HISTORY — DX: Unspecified thoracic, thoracolumbar and lumbosacral intervertebral disc disorder: M51.9

## 2011-09-19 HISTORY — DX: Gastro-esophageal reflux disease without esophagitis: K21.9

## 2011-09-19 NOTE — Patient Instructions (Signed)
Please continue same dose of levothyroxine. You are due for repeat screening colonoscopy. Please contact gastroenterologist for appointment. We will schedule you for physical examination in 6 months.

## 2011-09-19 NOTE — Progress Notes (Signed)
  Subjective:    Patient ID: Corey Jordan, male    DOB: [birth year], 77 y.o.   MRN: [id number]  HPI 77 year old white male with history of GE reflux on PPI, history of hypothyroidism  with elevated TSH of 8.656 detected during hospitalization for heart disease June 2008 now on thyroid replacement therapy, history of coronary artery disease, history of ischemic cardiomyopathy with ejection fraction 30% on catheterization August 2007. History of class II congestive heart failure and dyslipidemia. Patient had implantation of Medtronic MaximoVR cardioverter defibrillator August 2007. Followed by Dr. Sharrell Ku and Dr. Juanito Doom. In today for six-month recheck. Had shingles vaccine April 2011, Pneumovax vaccine January 2010,Tdap April 2011. Had colonoscopy by Dr. Arlyce Dice October 2005. Due for repeat study.  History of spondylosis and herniated disc with left radiculopathy L5 and S1 status post L5-S1 fusion by Dr. Channing Mutters January 2011.   History of left inguinal hernia repair by Dr. Maryagnes Amos summer 2000  Patient had myocardial infarction in 2004 with total occlusion of the LAD which was stented x2. Right coronary artery had 40% proximal lesion. Circumflex was normal. Patient had 80% diagonal lesion. Was noted then to have ejection fraction 30-35%.   Family history: mother died of pneumonia at age 57, father died of heart failure at age 74, one brother died at age 66 of cancer. Father had a history of venous thrombosis and apparently had a leg amputation. Social history: he is retired from the post [employer]. Is married with 2 adult children. Does not smoke or consume alcohol.    Review of Systems no complaints today     Objective:   Physical Exam neck is supple without thyromegaly or carotid bruits; chest clear to auscultation; cardiac exam regular rate and rhythm; extremities without edema.        Assessment & Plan:  Hypothyroidism  GE reflux  Coronary artery disease  Ischemic cardiomyopathy with  implantable cardio defibrillator  Hypertension  Dyslipidemia  History of lumbar disc disease  Plan: Patient is due for repeat screening colonoscopy. Will be reminded to contact GI physician. TSH drawn and is pending on levothyroxin 0.05 mg daily. Will be due for physical examination in 6 months with fasting lab work.  Influenza immunization administered today

## 2011-09-29 ENCOUNTER — Encounter: Payer: Self-pay | Admitting: Gastroenterology

## 2011-11-06 ENCOUNTER — Encounter (HOSPITAL_COMMUNITY): Payer: Self-pay | Admitting: *Deleted

## 2011-11-06 ENCOUNTER — Emergency Department (HOSPITAL_COMMUNITY): Payer: Medicare Other

## 2011-11-06 ENCOUNTER — Inpatient Hospital Stay (HOSPITAL_COMMUNITY)
Admission: EM | Admit: 2011-11-06 | Discharge: 2011-11-09 | DRG: 287 | Disposition: A | Discharge status: Home / Self Care | Payer: Medicare Other | Attending: Cardiology | Admitting: Cardiology

## 2011-11-06 ENCOUNTER — Other Ambulatory Visit: Payer: Self-pay

## 2011-11-06 DIAGNOSIS — I491 Atrial premature depolarization: Secondary | ICD-10-CM | POA: Insufficient documentation

## 2011-11-06 DIAGNOSIS — I5022 Chronic systolic (congestive) heart failure: Secondary | ICD-10-CM

## 2011-11-06 DIAGNOSIS — I200 Unstable angina: Secondary | ICD-10-CM | POA: Diagnosis present

## 2011-11-06 DIAGNOSIS — I255 Ischemic cardiomyopathy: Secondary | ICD-10-CM

## 2011-11-06 DIAGNOSIS — Z9861 Coronary angioplasty status: Secondary | ICD-10-CM

## 2011-11-06 DIAGNOSIS — Z79899 Other long term (current) drug therapy: Secondary | ICD-10-CM

## 2011-11-06 DIAGNOSIS — Z981 Arthrodesis status: Secondary | ICD-10-CM

## 2011-11-06 DIAGNOSIS — K219 Gastro-esophageal reflux disease without esophagitis: Secondary | ICD-10-CM | POA: Insufficient documentation

## 2011-11-06 DIAGNOSIS — I509 Heart failure, unspecified: Secondary | ICD-10-CM | POA: Diagnosis present

## 2011-11-06 DIAGNOSIS — I2589 Other forms of chronic ischemic heart disease: Secondary | ICD-10-CM | POA: Diagnosis present

## 2011-11-06 DIAGNOSIS — I10 Essential (primary) hypertension: Secondary | ICD-10-CM | POA: Insufficient documentation

## 2011-11-06 DIAGNOSIS — Z7982 Long term (current) use of aspirin: Secondary | ICD-10-CM

## 2011-11-06 DIAGNOSIS — I493 Ventricular premature depolarization: Secondary | ICD-10-CM | POA: Insufficient documentation

## 2011-11-06 DIAGNOSIS — R079 Chest pain, unspecified: Secondary | ICD-10-CM

## 2011-11-06 DIAGNOSIS — Z7902 Long term (current) use of antithrombotics/antiplatelets: Secondary | ICD-10-CM

## 2011-11-06 DIAGNOSIS — M47816 Spondylosis without myelopathy or radiculopathy, lumbar region: Secondary | ICD-10-CM | POA: Insufficient documentation

## 2011-11-06 DIAGNOSIS — I252 Old myocardial infarction: Secondary | ICD-10-CM

## 2011-11-06 DIAGNOSIS — Z95810 Presence of automatic (implantable) cardiac defibrillator: Secondary | ICD-10-CM | POA: Insufficient documentation

## 2011-11-06 DIAGNOSIS — M1990 Unspecified osteoarthritis, unspecified site: Secondary | ICD-10-CM | POA: Diagnosis present

## 2011-11-06 DIAGNOSIS — I2510 Atherosclerotic heart disease of native coronary artery without angina pectoris: Principal | ICD-10-CM

## 2011-11-06 DIAGNOSIS — E785 Hyperlipidemia, unspecified: Secondary | ICD-10-CM | POA: Insufficient documentation

## 2011-11-06 HISTORY — DX: Spondylosis without myelopathy or radiculopathy, lumbar region: M47.816

## 2011-11-06 HISTORY — DX: Personal history of other diseases of the digestive system: Z87.19

## 2011-11-06 HISTORY — DX: Chronic systolic (congestive) heart failure: I50.22

## 2011-11-06 HISTORY — DX: Atrial premature depolarization: I49.1

## 2011-11-06 HISTORY — DX: Ventricular premature depolarization: I49.3

## 2011-11-06 LAB — POCT I-STAT TROPONIN I: Troponin i, poc: 0.01 ng/mL (ref 0.00–0.08)

## 2011-11-06 LAB — CARDIAC PANEL(CRET KIN+CKTOT+MB+TROPI): Troponin I: <0.3 ng/mL (ref <0.30)

## 2011-11-06 LAB — CBC
Hemoglobin: 13.3 g/dL (ref 13.0–17.0)
MCHC: 32.3 g/dL (ref 30.0–36.0)
Platelets: 169 10*3/uL (ref 150–400)
RDW: 12.7 % (ref 11.5–15.5)

## 2011-11-06 LAB — PROTIME-INR: INR: 1.05 (ref 0.00–1.49)

## 2011-11-06 LAB — BASIC METABOLIC PANEL
GFR calc Af Amer: 72 mL/min — ABNORMAL LOW (ref >90)
GFR calc non Af Amer: 62 mL/min — ABNORMAL LOW (ref >90)
Potassium: 4.1 mEq/L (ref 3.5–5.1)
Sodium: 138 mEq/L (ref 135–145)

## 2011-11-06 MED ORDER — ACETAMINOPHEN 325 MG PO TABS
650.0000 mg | ORAL_TABLET | ORAL | Status: DC | PRN
  Filled 2011-11-06: qty 1

## 2011-11-06 MED ORDER — EZETIMIBE-SIMVASTATIN 10-40 MG PO TABS
1.0000 | ORAL_TABLET | Freq: Every day | ORAL | Status: DC
  Administered 2011-11-06 – 2011-11-09 (×4): 1 via ORAL
  Filled 2011-11-06 (×4): qty 1

## 2011-11-06 MED ORDER — ASPIRIN 81 MG PO CHEW
324.0000 mg | CHEWABLE_TABLET | Freq: Once | ORAL | Status: AC
  Administered 2011-11-06: 324 mg via ORAL
  Filled 2011-11-06: qty 4

## 2011-11-06 MED ORDER — HEPARIN BOLUS VIA INFUSION
4000.0000 [IU] | Freq: Once | INTRAVENOUS | Status: DC
  Filled 2011-11-06: qty 4000

## 2011-11-06 MED ORDER — MORPHINE SULFATE 2 MG/ML IJ SOLN
INTRAMUSCULAR | Status: AC
  Administered 2011-11-06: 2 mg via INTRAVENOUS
  Filled 2011-11-06: qty 1

## 2011-11-06 MED ORDER — MORPHINE SULFATE 10 MG/5ML PO SOLN: 2.0000 mg | ORAL | Status: DC | PRN

## 2011-11-06 MED ORDER — NITROGLYCERIN 0.4 MG SL SUBL
0.4000 mg | SUBLINGUAL_TABLET | SUBLINGUAL | Status: DC | PRN
  Administered 2011-11-06 (×3): 0.4 mg via SUBLINGUAL

## 2011-11-06 MED ORDER — SODIUM CHLORIDE 0.9 % IJ SOLN
3.0000 mL | Freq: Two times a day (BID) | INTRAMUSCULAR | Status: DC
  Administered 2011-11-06 – 2011-11-08 (×4): 3 mL via INTRAVENOUS

## 2011-11-06 MED ORDER — HEPARIN SOD (PORCINE) IN D5W 100 UNIT/ML IV SOLN
850.0000 [IU]/h | INTRAVENOUS | Status: DC
  Administered 2011-11-06 – 2011-11-09 (×3): 850 [IU]/h via INTRAVENOUS
  Filled 2011-11-06 (×3): qty 250

## 2011-11-06 MED ORDER — NITROGLYCERIN 0.4 MG SL SUBL
0.4000 mg | SUBLINGUAL_TABLET | SUBLINGUAL | Status: AC | PRN
  Administered 2011-11-06 (×3): 0.4 mg via SUBLINGUAL

## 2011-11-06 MED ORDER — CARVEDILOL 6.25 MG PO TABS
6.2500 mg | ORAL_TABLET | Freq: Two times a day (BID) | ORAL | Status: DC
  Administered 2011-11-06 – 2011-11-08 (×5): 6.25 mg via ORAL
  Filled 2011-11-06 (×9): qty 1

## 2011-11-06 MED ORDER — ONDANSETRON HCL 4 MG/2ML IJ SOLN: 4.0000 mg | Freq: Four times a day (QID) | INTRAMUSCULAR | Status: DC | PRN

## 2011-11-06 NOTE — ED Notes (Signed)
Family at bedside. 

## 2011-11-06 NOTE — ED Notes (Signed)
[id number] ready

## 2011-11-06 NOTE — ED Notes (Signed)
md aware of chest pain 1/10 continues after ntg x 3 sl.

## 2011-11-06 NOTE — ED Notes (Signed)
Pt continues to rate pain 2/10.  Pt presently awaiting bed assignment on 4700.  Pts food ordered Heart Healthy diet.

## 2011-11-06 NOTE — H&P (Signed)
Patient ID: Corey Jordan MRN: [id number], DOB/AGE: [birth year]   Admit date: 11/06/2011   Primary Physician: Margaree Mackintosh, MD, MD Primary Cardiologist: T. Wall  Pt. Profile:   77 y/o male w/ h/o CAD s/p Ant. MI and LAD DES in 2004, who presents with recurrent c/p.  Problem List: Past Medical History  Diagnosis Date  . Coronary artery disease     s/p Anterior MI 10/23/03 - 2 Cypher DES placed in 100% LAD; 12/2008 - cath - patent LAD stents w/ 90% D1 stenosis (small - unchanged)  . Cardiac defibrillator in situ     s/p MDT Maximo VR single lead ICD - 07/06/2006  . Hyperlipidemia   . Hypertension   . Ischemic cardiomyopathy     EF 40% 12/2008 V gram  . History of orthostatic hypotension   . Degenerative joint disease   . Hyperthyroidism   . Premature atrial contractions   . Premature ventricular contractions   . Systolic CHF, chronic   . Lumbar spondylosis     s/p L5-S1 fusion 12/11/2009    Past Surgical History  Procedure Date  . Icd     s/p MDT Maximo VR single lead ICD - 07/06/2006  . Lumbar fusion     s/p L5-S1 fusion 12/11/2009     Allergies: No Known Allergies  HPI:   77 y/o male with the above problem list.  He is s/p large Ant. MI in 2004 with LAD DES x 2 (Cypher).  He has had multiple caths since, all revealing patent stents with known 90% Diag stenosis (small vessel) - this has been medically managed.  His last cath was in 12/2008.  He and his wife are quite active @ home, walking regularly.  Earlier this week, he had an episode of chest discomfort and tightness that he recognized as angina and he took a ntg with eventual relief.  This am, he awoke w/ 2/10 c/p and took a ntg with eventual relief.  He then went out to bfast and had recurrent chest discomfort - 2/10 - unrelieved by 2 ntg.  He then presented to the ED, where he was given additional NTG with reduction of c/p to 1/10, where it remains.  He has had no assoc. Ss.  He's not sure if this is similar to prior  angina or not.  When he had his MI in 2004, his chest pain was severe.  So far, CE are negative and EKG is non-acute.   Home Medications  Medications Prior to Admission  Medication Sig Dispense Refill  . aspirin 81 MG EC tablet Take 81 mg by mouth daily.        . carvedilol (COREG) 6.25 MG tablet Take 1 tablet (6.25 mg total) by mouth 2 (two) times daily with a meal.  180 tablet  1  . ezetimibe-simvastatin (VYTORIN) 10-40 MG per tablet Take 1 tablet by mouth at bedtime.  90 tablet  3  . levothyroxine (SYNTHROID, LEVOTHROID) 50 MCG tablet Take 50 mcg by mouth every morning.       . nitroGLYCERIN (NITROSTAT) 0.4 MG SL tablet Place 0.4 mg under the tongue every 5 (five) minutes as needed. For chest pain      . omeprazole (PRILOSEC) 20 MG capsule Take 20 mg by mouth daily.        . ramipril (ALTACE) 2.5 MG capsule Take 2.5 mg by mouth every morning.        Marland Kitchen spironolactone (ALDACTONE) 25 MG tablet Take 12.5 mg by  mouth daily.       . clopidogrel (PLAVIX) 75 MG tablet Take 1 tablet (75 mg total) by mouth daily.  90 tablet  2     Family History  Problem Relation Age of Onset  . Pneumonia Mother     deceased 57  . Peripheral vascular disease Father     deceased 66    History   Social History  . Marital Status: Married    Spouse Name: [name]    Number of Children: [name]  . Years of Education: [name]   Occupational History  . retired [name]   Social History Main Topics  . Smoking status: Never Smoker   . Smokeless tobacco: Not on file  . Alcohol Use: No  . Drug Use: No  . Sexually Active: Not on file   Other Topics Concern  . Not on file   Social History Narrative   Active, walks regularly.     Review of Systems: General: negative for chills, fever, night sweats or weight changes.  Cardiovascular: +++ for chest pain.  No dyspnea on exertion, edema, orthopnea, palpitations, paroxysmal nocturnal dyspnea. Dermatological: has had recurrent cancerous lesions on his scalp s/p  relatively recent excision.  Followed closely by Dermatology. Respiratory: negative for cough or wheezing Urologic: negative for hematuria Abdominal: negative for nausea, vomiting, diarrhea, bright red blood per rectum, melena, or hematemesis Neurologic: negative for visual changes, syncope, or dizziness All other systems reviewed and are otherwise negative except as noted above.  Physical Exam: Blood pressure 110/62, pulse 65, temperature 97.5 F (36.4 C), temperature source Oral, resp. rate 16, SpO2 97.00%.  General: Well developed, well nourished, in no acute distress. Head: Normocephalic, atraumatic, sclera non-icteric, no xanthomas, nares are without discharge.  Neck: Supple without bruits or JVD. Lungs:  Resp regular and unlabored, CTA. Heart: RRR no s3, s4, or murmurs. Abdomen: Soft, non-tender, non-distended, BS + x 4.  Msk:  Strength and tone appears normal for age. Extremities: No clubbing, cyanosis or edema. DP/PT/Radials 2+ and equal bilaterally. Neuro: Alert and oriented X 3. Moves all extremities spontaneously. Psych: Normal affect.   Labs:   Results for orders placed during the hospital encounter of 11/06/11 (from the past 72 hour(s))  CBC     Status: Normal   Collection Time   11/06/11 11:20 AM      Component Value Range Comment   WBC 6.3  4.0 - 10.5 (K/uL)    RBC 4.43  4.22 - 5.81 (MIL/uL)    Hemoglobin 13.3  13.0 - 17.0 (g/dL)    HCT 16.1  09.6 - 04.5 (%)    MCV 93.0  78.0 - 100.0 (fL)    MCH 30.0  26.0 - 34.0 (pg)    MCHC 32.3  30.0 - 36.0 (g/dL)    RDW 40.9  81.1 - 91.4 (%)    Platelets 169  150 - 400 (K/uL)   BASIC METABOLIC PANEL     Status: Abnormal   Collection Time   11/06/11 11:20 AM      Component Value Range Comment   Sodium 138  135 - 145 (mEq/L)    Potassium 4.1  3.5 - 5.1 (mEq/L)    Chloride 101  96 - 112 (mEq/L)    CO2 29  19 - 32 (mEq/L)    Glucose, Bld 92  70 - 99 (mg/dL)    BUN 16  6 - 23 (mg/dL)    Creatinine, Ser 7.82  0.50 - 1.35  (mg/dL)  Calcium 9.9  8.4 - 10.5 (mg/dL)    GFR calc non Af Amer 62 (*) >90 (mL/min)    GFR calc Af Amer 72 (*) >90 (mL/min)   POCT I-STAT TROPONIN I     Status: Normal   Collection Time   11/06/11 11:39 AM      Component Value Range Comment   Troponin i, poc 0.01  0.00 - 0.08 (ng/mL)    Comment 3               Radiology/Studies: Dg Chest Port 1 View  11/06/2011  [facility]                                                                                                                                                                                                         *RADIOLOGY REPORT*  Clinical Data: Chest pain.  CHEST - 1 VIEW  Comparison:  11/18/2010.  Findings: Trachea is midline.  Heart size stable.  Single AICD lead tip projects over the right ventricle.  Lungs are clear.  No pleural fluid.  IMPRESSION: No acute findings.  Original Report Authenticated By: Reyes Ivan, M.D.    EKG:  RSR, 63, NL Axis, TWI aVL, V2-V4 - not acutely changed c/w ekg 01/02/2009  ASSESSMENT AND PLAN:   1.  USA/CAD:  Pt presents following recurrent episodes of chest discomfort this am, but also had to take NTG earlier this week.  Ss are at least partially nitrate responsive.  He currently reports 1/10 tightness in his chest but otw appears comfortable.  CE negative so far and ekg is non-acute.  We will plan to admit, cycle ce, cont asa, plavix, statin, bb.  Will add heparin 2/2 ongoing discomfort.  Add prn MSO4.  Will review labs and symptoms in the am and determine @ that ponit whether or not we will proceed with myoview (inpt vs outpt) or cath.  Pt has had a number of caths without intervention.  2.  ICM/Chronic Syst CHF:  euvolemic on exam.  Cont bb, acei, spiro.  3.  HTN:  Stable.  4.  HL:  Cont statin.  Check lipids/lft's.   Signed, Nicolasa Ducking, NP 11/06/2011, 3:41 PM Patient seen and examined and history reviewed. Agree with above findings and plan. Exam unremarkable. No chest  wall pain. No murmurs or gallop. Lungs clear. Abdomen benign. Patient's symptoms certainly concerning for new onset angina. Still having low grade pain. Has been under recent stress related to skin cancer dx and surgery. Ecg shows some new T inversion in V4. Otherwise no  change in anterior infarct pattern. Will admit and rule out. Start IV Ntg and heparin until pain free. He has been cathed several times since MI last in 2010 with stable findings but may require repeat evaluation. Will tentatively schedule for cath pending initial enzymes and response to therapy.  Thedora Hinders 11/06/2011 4:01 PM

## 2011-11-06 NOTE — ED Notes (Signed)
[place] at pts bedside for admission.

## 2011-11-06 NOTE — ED Notes (Signed)
Assumed care of pt from [place].  Pt alert and oriented x4 and continues to c/o chest pressure which he now rates a 2/10.  Pt just recvd his second round of nitro x 3 without relief.  Awaiting admitting MD for further orders.

## 2011-11-06 NOTE — ED Provider Notes (Signed)
History     CSN: [id number] Arrival date & time: 11/06/2011 10:45 AM   First MD Initiated Contact with Patient 11/06/11 1220      Chief Complaint  Patient presents with  . Chest Pain     Patient is a 77 y.o. male presenting with chest pain. The history is provided by the patient and the spouse.  Chest Pain The chest pain began 3 - 5 hours ago. Chest pain occurs constantly. The chest pain is improving. Associated with: nothing. The severity of the pain is moderate. The quality of the pain is described as aching. The pain does not radiate. Exacerbated by: nothing. Pertinent negatives for primary symptoms include no shortness of breath.  Associated symptoms include weakness. He tried nitroglycerin for the symptoms.   Pt reports he woke up with chest pain/pressure that lasted for several hours Improved with NTG Last CP episode about 3 days ago It returned today Before that, no recent CP Now feeling improved Reports h/o MI in the past Reports he has ICD, no recent shocks  Past Medical History  Diagnosis Date  . Coronary artery disease   . Myocardial infarction, inferior wall   . Cardiac defibrillator in situ     automatic implantable  . Hyperlipidemia   . Hypertension   . Ischemic cardiomyopathy   . CHF (congestive heart failure)   . History of orthostatic hypotension   . Degenerative joint disease   . Hyperthyroidism     Past Surgical History  Procedure Date  . Icd     History reviewed. No pertinent family history.  History  Substance Use Topics  . Smoking status: Never Smoker   . Smokeless tobacco: Not on file  . Alcohol Use: No      Review of Systems  Respiratory: Negative for shortness of breath.   Cardiovascular: Positive for chest pain.  Neurological: Positive for weakness.  All other systems reviewed and are negative.    Allergies  Review of patient's allergies indicates no known allergies.  Home Medications   Current Outpatient Rx  Name  Route Sig Dispense Refill  . ASPIRIN 81 MG PO TBEC Oral Take 81 mg by mouth daily.      Marland Kitchen CALCIUM CARBONATE 600 MG PO TABS Oral Take 600 mg by mouth 2 (two) times daily with a meal.      . CARVEDILOL 6.25 MG PO TABS Oral Take 1 tablet (6.25 mg total) by mouth 2 (two) times daily with a meal. 180 tablet 1  . VITAMIN D 1000 UNITS PO TABS Oral Take 2,000 Units by mouth daily.      Marland Kitchen EZETIMIBE-SIMVASTATIN 10-40 MG PO TABS Oral Take 1 tablet by mouth at bedtime. 90 tablet 3    RX CALLED IN TO [facility] TODAY  . LEVOTHYROXINE SODIUM 50 MCG PO TABS Oral Take 50 mcg by mouth every morning.     Marland Kitchen NITROGLYCERIN 0.4 MG SL SUBL Sublingual Place 0.4 mg under the tongue every 5 (five) minutes as needed. For chest pain    . OMEPRAZOLE 20 MG PO CPDR Oral Take 20 mg by mouth daily.      Marland Kitchen RAMIPRIL 2.5 MG PO CAPS Oral Take 2.5 mg by mouth every morning.      Marland Kitchen SPIRONOLACTONE 25 MG PO TABS Oral Take 12.5 mg by mouth daily.     Marland Kitchen CLOPIDOGREL BISULFATE 75 MG PO TABS Oral Take 1 tablet (75 mg total) by mouth daily. 90 tablet 2    BP 108/55  Pulse 58  Temp(Src) 97.5 F (36.4 C) (Oral)  Resp 18  SpO2 97%  Physical Exam Constitutional: well developed, well nourished, no distress Head and Face: normocephalic/atraumatic Eyes: EOMI/PERRL ENMT: mucous membranes moist Neck: supple, no meningeal signs Spine - no CTL tenderness CV: no murmur/rubs/gallops noted Lungs: clear to auscultation bilaterally Abd: soft, nontender Extremities: full ROM noted, pulses normal/equal Neuro: awake/alert, no distress, appropriate for age, [id number], Skin: no rash/petechiae noted.  Color normal.  Warm Psych: appropriate for age  ED Course  Procedures   Labs Reviewed  BASIC METABOLIC PANEL - Abnormal; Notable for the following:    GFR calc non Af Amer 62 (*)    GFR calc Af Amer 72 (*)    All other components within normal limits  CBC  POCT I-STAT TROPONIN I  I-STAT TROPONIN I   1:24 PM Pt stable at this time, cp  essentially gone Will give ASA Awaiting imaging at his time Denies pleuritic type CP  2:22 PM Pt with some return of CP D/w [place] cardiology will see patient    MDM  Nursing notes reviewed and considered in documentation. All labs/vitals reviewed and considered xrays reviewed and considered     Date: 11/06/2011  Rate: 63  Rhythm: normal sinus rhythm  QRS Axis: left  Intervals: normal  ST/T Wave abnormalities: t wave abnormality in anterior leads  Conduction Disutrbances:none  Narrative Interpretation:   Old EKG Reviewed: unchanged       Joya Gaskins, MD 11/06/11 1423

## 2011-11-06 NOTE — ED Notes (Signed)
Cp this am, with exertion; took ntg. With some relief. Pain level 1/10. Pt. Does pacer. Has an icd.

## 2011-11-06 NOTE — Progress Notes (Signed)
ANTICOAGULATION CONSULT NOTE - Initial Consult  Pharmacy Consult for Heparin Indication: chest pain/ACS  No Known Allergies  Patient Measurements: Height: 5\' 8"  (172.7 cm) Weight: 147 lb 11.3 oz (67 kg) (scaje A) IBW/kg (Calculated) : 68.4  Hep dosing wt:67 Kg  Vital Signs: Temp: 98.7 F (37.1 C) (12/14 1756) Temp src: Oral (12/14 1756) BP: 126/70 mmHg (12/14 1756) Pulse Rate: 62  (12/14 1756)  Labs:  Basename 11/06/11 1120  HGB 13.3  HCT 41.2  PLT 169  APTT --  LABPROT --  INR --  HEPARINUNFRC --  CREATININE 1.12  CKTOTAL --  CKMB --  TROPONINI --   Estimated Creatinine Clearance: 54 ml/min (by C-G formula based on Cr of 1.12).  Medical History: Past Medical History  Diagnosis Date  . Coronary artery disease     s/p Anterior MI 10/23/03 - 2 Cypher DES placed in 100% LAD; 12/2008 - cath - patent LAD stents w/ 90% D1 stenosis (small - unchanged)  . Cardiac defibrillator in situ     s/p MDT Maximo VR single lead ICD - 07/06/2006  . Hyperlipidemia   . Hypertension   . Ischemic cardiomyopathy     EF 40% 12/2008 V gram  . History of orthostatic hypotension   . Degenerative joint disease   . Hyperthyroidism   . Premature atrial contractions   . Premature ventricular contractions   . Systolic CHF, chronic   . Lumbar spondylosis     s/p L5-S1 fusion 12/11/2009    Medications:  Prescriptions prior to admission  Medication Sig Dispense Refill  . aspirin 81 MG EC tablet Take 81 mg by mouth daily.        . calcium carbonate (OS-CAL) 600 MG TABS Take 600 mg by mouth 2 (two) times daily with a meal.        . carvedilol (COREG) 6.25 MG tablet Take 1 tablet (6.25 mg total) by mouth 2 (two) times daily with a meal.  180 tablet  1  . cholecalciferol (VITAMIN D) 1000 UNITS tablet Take 2,000 Units by mouth daily.        Marland Kitchen ezetimibe-simvastatin (VYTORIN) 10-40 MG per tablet Take 1 tablet by mouth at bedtime.  90 tablet  3  . levothyroxine (SYNTHROID, LEVOTHROID) 50 MCG tablet  Take 50 mcg by mouth every morning.       . nitroGLYCERIN (NITROSTAT) 0.4 MG SL tablet Place 0.4 mg under the tongue every 5 (five) minutes as needed. For chest pain      . omeprazole (PRILOSEC) 20 MG capsule Take 20 mg by mouth daily.        . ramipril (ALTACE) 2.5 MG capsule Take 2.5 mg by mouth every morning.        Marland Kitchen spironolactone (ALDACTONE) 25 MG tablet Take 12.5 mg by mouth daily.       . clopidogrel (PLAVIX) 75 MG tablet Take 1 tablet (75 mg total) by mouth daily.  90 tablet  2   Scheduled:    . aspirin  324 mg Oral Once  . morphine      . sodium chloride  3 mL Intravenous Q12H    Assessment: 77 yo Patient with known CAD, hx MI 2004 being admitted with chest pain x~ 3 day duration. Received orders for heparin per ACS protocol. Noted possible plans for myoview in AM. CBC WNL at baseline.  Goal of Therapy:  Heparin level 0.3-0.7 units/ml   Plan: 1. Heparin 4000 units IV bolus x 1 now, then start heparin  drip at 850 units/hr. 2. Check heparin level 8h after drip starts, then daily heparin levels and CBC. 3. Will f/up along with you.  Zhania Shaheen K. Allena Katz, PharmD, BCPS.  Clinical Pharmacist Pager [phone]. 11/06/2011 6:36 PM

## 2011-11-07 ENCOUNTER — Other Ambulatory Visit: Payer: Self-pay

## 2011-11-07 DIAGNOSIS — I2510 Atherosclerotic heart disease of native coronary artery without angina pectoris: Secondary | ICD-10-CM

## 2011-11-07 LAB — CBC
MCH: 29.9 pg (ref 26.0–34.0)
MCV: 93.8 fL (ref 78.0–100.0)
Platelets: 146 10*3/uL — ABNORMAL LOW (ref 150–400)
RBC: 4.21 MIL/uL — ABNORMAL LOW (ref 4.22–5.81)
RDW: 12.9 % (ref 11.5–15.5)
WBC: 4.9 10*3/uL (ref 4.0–10.5)

## 2011-11-07 LAB — LIPID PANEL
Cholesterol: 140 mg/dL (ref 0–200)
HDL: 71 mg/dL (ref >39)
LDL Cholesterol: 61 mg/dL (ref 0–99)
Triglycerides: 41 mg/dL (ref <150)

## 2011-11-07 LAB — HEPARIN LEVEL (UNFRACTIONATED): Heparin Unfractionated: 0.67 IU/mL (ref 0.30–0.70)

## 2011-11-07 LAB — BASIC METABOLIC PANEL
BUN: 14 mg/dL (ref 6–23)
CO2: 30 mEq/L (ref 19–32)
Calcium: 9.6 mg/dL (ref 8.4–10.5)
Creatinine, Ser: 1.11 mg/dL (ref 0.50–1.35)
Glucose, Bld: 90 mg/dL (ref 70–99)

## 2011-11-07 LAB — CARDIAC PANEL(CRET KIN+CKTOT+MB+TROPI)
CK, MB: 2.7 ng/mL (ref 0.3–4.0)
CK, MB: 2.5 ng/mL (ref 0.3–4.0)
CK, MB: 2.6 ng/mL (ref 0.3–4.0)
Troponin I: <0.3 ng/mL (ref <0.30)
Troponin I: <0.3 ng/mL (ref <0.30)

## 2011-11-07 MED ORDER — RAMIPRIL 2.5 MG PO CAPS
2.5000 mg | ORAL_CAPSULE | Freq: Every day | ORAL | Status: DC
  Administered 2011-11-07 – 2011-11-08 (×2): 2.5 mg via ORAL
  Filled 2011-11-07 (×3): qty 1

## 2011-11-07 MED ORDER — NITROGLYCERIN IN D5W 200-5 MCG/ML-% IV SOLN
2.0000 ug/min | INTRAVENOUS | Status: DC
  Administered 2011-11-07: 5 ug/min via INTRAVENOUS
  Filled 2011-11-07: qty 250

## 2011-11-07 MED ORDER — MORPHINE SULFATE 2 MG/ML IJ SOLN: 1.0000 mg | INTRAMUSCULAR | Status: DC | PRN

## 2011-11-07 MED ORDER — CLOPIDOGREL BISULFATE 75 MG PO TABS
75.0000 mg | ORAL_TABLET | Freq: Every day | ORAL | Status: DC
  Administered 2011-11-07 – 2011-11-08 (×2): 75 mg via ORAL
  Filled 2011-11-07 (×5): qty 1

## 2011-11-07 MED ORDER — SPIRONOLACTONE 12.5 MG HALF TABLET
12.5000 mg | ORAL_TABLET | Freq: Every day | ORAL | Status: DC
  Administered 2011-11-07 – 2011-11-08 (×2): 12.5 mg via ORAL
  Filled 2011-11-07 (×3): qty 1

## 2011-11-07 MED ORDER — ASPIRIN 81 MG PO TBEC: 81.0000 mg | DELAYED_RELEASE_TABLET | Freq: Every day | ORAL | Status: DC

## 2011-11-07 MED ORDER — SODIUM CHLORIDE 0.9 % IV SOLN
250.0000 mL | INTRAVENOUS | Status: DC | PRN
  Administered 2011-11-08: 250 mL via INTRAVENOUS

## 2011-11-07 MED ORDER — SODIUM CHLORIDE 0.9 % IJ SOLN: 3.0000 mL | INTRAMUSCULAR | Status: DC | PRN

## 2011-11-07 MED ORDER — ASPIRIN EC 81 MG PO TBEC
81.0000 mg | DELAYED_RELEASE_TABLET | Freq: Every day | ORAL | Status: DC
  Administered 2011-11-07 – 2011-11-08 (×2): 81 mg via ORAL
  Filled 2011-11-07 (×3): qty 1

## 2011-11-07 NOTE — Progress Notes (Addendum)
Patient ID: TAREZ BOWNS, male   DOB: 1934-01-17, 77 y.o.   MRN: [id number] [facility]  SUBJECTIVE: Still has 1/10 chest pain, improved from yesterday but not totally resolved.  Cardiac enzymes have remained negative, including today.      Marland Kitchen aspirin  324 mg Oral Once  . aspirin  81 mg Oral Daily  . carvedilol  6.25 mg Oral BID WC  . clopidogrel  75 mg Oral Q breakfast  . ezetimibe-simvastatin  1 tablet Oral q1800  . heparin  4,000 Units Intravenous Once  . morphine      . ramipril  2.5 mg Oral Daily  . sodium chloride  3 mL Intravenous Q12H  . spironolactone  12.5 mg Oral Daily     Filed Vitals:   11/06/11 1709 11/06/11 1756 11/06/11 2209 11/07/11 0657  BP: 130/62 126/70 105/54 138/73  Pulse: 55 62 54 61  Temp:  98.7 F (37.1 C) 97.6 F (36.4 C) 97.7 F (36.5 C)  TempSrc:  Oral Oral   Resp: 16 18 16 18   Height:  5\' 8"  (1.727 m)    Weight:  67 kg (147 lb 11.3 oz)  65.817 kg (145 lb 1.6 oz)  SpO2: 96% 93% 96% 96%    Intake/Output Summary (Last 24 hours) at 11/07/11 0816 Last data filed at 11/07/11 0659  Gross per 24 hour  Intake    240 ml  Output   1400 ml  Net  -1160 ml    LABS: Basic Metabolic Panel:  Basename 11/07/11 0530 11/06/11 1120  NA 138 138  K 4.4 4.1  CL 101 101  CO2 30 29  GLUCOSE 90 92  BUN 14 16  CREATININE 1.11 1.12  CALCIUM 9.6 9.9  MG -- --  PHOS -- --   Liver Function Tests: No results found for this basename: AST:2,ALT:2,ALKPHOS:2,BILITOT:2,PROT:2,ALBUMIN:2 in the last 72 hours No results found for this basename: LIPASE:2,AMYLASE:2 in the last 72 hours CBC:  Basename 11/07/11 0500 11/06/11 1120  WBC 4.9 6.3  NEUTROABS -- --  HGB 12.6* 13.3  HCT 39.5 41.2  MCV 93.8 93.0  PLT 146* 169   Cardiac Enzymes:  Basename 11/07/11 0530 11/07/11 0028 11/06/11 1826  CKTOTAL 91 99 120  CKMB 2.5 2.7 2.9  CKMBINDEX -- -- --  TROPONINI <0.30 <0.30 <0.30   BNP: No components found with this basename: POCBNP:3 D-Dimer: No  results found for this basename: DDIMER:2 in the last 72 hours Hemoglobin A1C: No results found for this basename: HGBA1C in the last 72 hours Fasting Lipid Panel:  Basename 11/07/11 0500  CHOL 140  HDL 71  LDLCALC 61  TRIG 41  CHOLHDL 2.0  LDLDIRECT --   Thyroid Function Tests:  Basename 11/06/11 1825  TSH 3.213  T4TOTAL --  T3FREE --  THYROIDAB --   Anemia Panel: No results found for this basename: VITAMINB12,FOLATE,FERRITIN,TIBC,IRON,RETICCTPCT in the last 72 hours  RADIOLOGY: Dg Chest Port 1 View  11/06/2011  HEWJOHN B                                                                                                                                                                                                         *  RADIOLOGY REPORT*  Clinical Data: Chest pain.  CHEST - 1 VIEW  Comparison:  11/18/2010.  Findings: Trachea is midline.  Heart size stable.  Single AICD lead tip projects over the right ventricle.  Lungs are clear.  No pleural fluid.  IMPRESSION: No acute findings.  Original Report Authenticated By: Reyes Ivan, M.D.    PHYSICAL EXAM General: NAD Neck: No JVD, no thyromegaly or thyroid nodule.  Lungs: Clear to auscultation bilaterally with normal respiratory effort. CV: Nondisplaced PMI.  Heart regular S1/S2, +S4, no murmur.  No peripheral edema.  No carotid bruit. Abdomen: Soft, nontender, no hepatosplenomegaly, no distention.  Neurologic: Alert and oriented x 3.  Psych: Normal affect. Extremities: No clubbing or cyanosis.   TELEMETRY: Reviewed telemetry pt in NSR:  ASSESSMENT AND PLAN: 77 yo with history of CAD s/p distant anterior MI and ischemic cardiomyopathy presented with chest pain.  1. CAD: Still with 1/10 CP, better than yesterday.  Cardiac enzymes remain negative.   - Repeat ECG this morning.  - Add NTG gtt and will give dose of morphine.  - With normal enzymes, if ECG remains unchanged and pain resolved with the above, will plan on left  heart cath Monday am.  - Treat with ASA, Plavix, heparin gtt, Vytorin, Coreg, ramipril. 2. Ischemic cardiomyopathy: Patient appears euvolemic on exam.  No recent assessment of LV systolic function.  Will get echo.  Continue Coreg, ramipril, spironolactone.   Marca Ancona 11/07/2011 8:20 AM  ECG NSR with TWI AVL, V2-V3 today.  Per description in yesterday's note, sounds identical to yesterday's ECG but the actual ECG is not available.   Clayton Jarmon [employer]

## 2011-11-07 NOTE — Progress Notes (Signed)
Pharmacy - Heparin  RN called and said that heparin gtt was delayed in getting started. It did not start until 2230 last night and heparin bolus was mistakenly not given. Instructed RN to go ahead and give the bolus now and heparin level will be adjusted.  Plan:  Change heparin level to 6 hour after bolus given - 0830 on 12/15  Virdell Hoiland, Drake Leach 11/07/2011 2:41 AM

## 2011-11-07 NOTE — Progress Notes (Signed)
ANTICOAGULATION CONSULT NOTE - Follow Up Consult  Pharmacy Consult for  Heparin Indication: chest pain/ACS  No Known Allergies  Patient Measurements: Height: 5\' 8"  (172.7 cm) Weight: 145 lb 1.6 oz (65.817 kg) (scale A) IBW/kg (Calculated) : 68.4    Vital Signs: Temp: 97.7 F (36.5 C) (12/15 0657) BP: 121/78 mmHg (12/15 1018) Pulse Rate: 73  (12/15 1018)  Labs:  Basename 11/07/11 0849 11/07/11 0530 11/07/11 0500 11/07/11 0028 11/06/11 1826 11/06/11 1825 11/06/11 1120  HGB -- -- 12.6* -- -- -- 13.3  HCT -- -- 39.5 -- -- -- 41.2  PLT -- -- 146* -- -- -- 169  APTT -- -- -- -- -- -- --  LABPROT -- -- -- -- -- 13.9 --  INR -- -- -- -- -- 1.05 --  HEPARINUNFRC 0.67 -- -- -- -- -- --  CREATININE -- 1.11 -- -- -- -- 1.12  CKTOTAL -- 91 -- 99 120 -- --  CKMB -- 2.5 -- 2.7 2.9 -- --  TROPONINI -- <0.30 -- <0.30 <0.30 -- --   Estimated Creatinine Clearance: 53.5 ml/min (by C-G formula based on Cr of 1.11).     Assessment: Pt admitted for Cp x 3 days.  No ECG changes and neg cardiac enzymes, still with CP today improved with NTG drip.  Heparin drip 850 uts/hr HL 0.67 at goal 0.3-0.7.  No bleeding noted, CBC stable.  Renal fx and lytes stable.  Goal of Therapy:  Heparin level 0.3-0.7 units/ml   Plan:  Heparin drip 850 uts/hr  Check daily HL,CBC  Marcelino Scot 11/07/2011,1:09 PM

## 2011-11-08 DIAGNOSIS — I519 Heart disease, unspecified: Secondary | ICD-10-CM

## 2011-11-08 LAB — CBC
Hemoglobin: 12.6 g/dL — ABNORMAL LOW (ref 13.0–17.0)
MCH: 30.7 pg (ref 26.0–34.0)
MCHC: 33.2 g/dL (ref 30.0–36.0)
MCV: 92.7 fL (ref 78.0–100.0)
RBC: 4.1 MIL/uL — ABNORMAL LOW (ref 4.22–5.81)

## 2011-11-08 LAB — BASIC METABOLIC PANEL
BUN: 17 mg/dL (ref 6–23)
CO2: 27 mEq/L (ref 19–32)
Calcium: 9.9 mg/dL (ref 8.4–10.5)
Chloride: 104 mEq/L (ref 96–112)
Creatinine, Ser: 0.99 mg/dL (ref 0.50–1.35)

## 2011-11-08 MED ORDER — EZETIMIBE-SIMVASTATIN 10-40 MG PO TABS
1.0000 | ORAL_TABLET | Freq: Every day | ORAL | Status: DC
  Administered 2011-11-08: 1 via ORAL
  Filled 2011-11-08 (×2): qty 1

## 2011-11-08 MED ORDER — SODIUM CHLORIDE 0.9 % IJ SOLN
3.0000 mL | Freq: Two times a day (BID) | INTRAMUSCULAR | Status: DC
  Administered 2011-11-08: 3 mL via INTRAVENOUS

## 2011-11-08 MED ORDER — ASPIRIN 81 MG PO TBEC
81.0000 mg | DELAYED_RELEASE_TABLET | Freq: Every day | ORAL | Status: DC
  Administered 2011-11-08: 81 mg via ORAL
  Filled 2011-11-08 (×2): qty 1

## 2011-11-08 MED ORDER — CLOPIDOGREL BISULFATE 75 MG PO TABS
75.0000 mg | ORAL_TABLET | Freq: Every day | ORAL | Status: DC
  Administered 2011-11-08: 75 mg via ORAL
  Filled 2011-11-08 (×2): qty 1

## 2011-11-08 MED ORDER — SODIUM CHLORIDE 0.9 % IV SOLN: 250.0000 mL | INTRAVENOUS | Status: DC | PRN

## 2011-11-08 MED ORDER — RAMIPRIL 2.5 MG PO CAPS
2.5000 mg | ORAL_CAPSULE | ORAL | Status: DC
  Administered 2011-11-09: 2.5 mg via ORAL
  Filled 2011-11-08 (×2): qty 1

## 2011-11-08 MED ORDER — CLOPIDOGREL BISULFATE 75 MG PO TABS
75.0000 mg | ORAL_TABLET | ORAL | Status: AC
  Administered 2011-11-09: 75 mg via ORAL
  Filled 2011-11-08: qty 1

## 2011-11-08 MED ORDER — ASPIRIN 81 MG PO CHEW
324.0000 mg | CHEWABLE_TABLET | ORAL | Status: AC
  Administered 2011-11-09: 324 mg via ORAL
  Filled 2011-11-08: qty 4

## 2011-11-08 MED ORDER — VITAMIN D3 25 MCG (1000 UNIT) PO TABS
2000.0000 [IU] | ORAL_TABLET | Freq: Every day | ORAL | Status: DC
  Administered 2011-11-08: 2000 [IU] via ORAL
  Filled 2011-11-08 (×2): qty 2

## 2011-11-08 MED ORDER — SPIRONOLACTONE 12.5 MG HALF TABLET
12.5000 mg | ORAL_TABLET | Freq: Every day | ORAL | Status: DC
  Administered 2011-11-08: 25 mg via ORAL
  Filled 2011-11-08 (×2): qty 1

## 2011-11-08 MED ORDER — CALCIUM CARBONATE 1250 (500 CA) MG PO TABS
1.0000 | ORAL_TABLET | Freq: Two times a day (BID) | ORAL | Status: DC
  Administered 2011-11-09 (×2): 500 mg via ORAL
  Filled 2011-11-08 (×3): qty 1

## 2011-11-08 MED ORDER — SODIUM CHLORIDE 0.9 % IJ SOLN: 3.0000 mL | INTRAMUSCULAR | Status: DC | PRN

## 2011-11-08 MED ORDER — SODIUM CHLORIDE 0.9 % IV SOLN
1.0000 mL/kg/h | INTRAVENOUS | Status: DC
  Administered 2011-11-09: 1 mL/kg/h via INTRAVENOUS

## 2011-11-08 MED ORDER — CALCIUM CARBONATE 600 MG PO TABS: 600.0000 mg | ORAL_TABLET | Freq: Two times a day (BID) | ORAL | Status: DC

## 2011-11-08 MED ORDER — NITROGLYCERIN IN D5W 200-5 MCG/ML-% IV SOLN
5.0000 ug/min | INTRAVENOUS | Status: DC
  Administered 2011-11-09: 5 ug/min via INTRAVENOUS

## 2011-11-08 MED ORDER — PANTOPRAZOLE SODIUM 40 MG PO TBEC
40.0000 mg | DELAYED_RELEASE_TABLET | Freq: Every day | ORAL | Status: DC
  Administered 2011-11-08 – 2011-11-09 (×2): 40 mg via ORAL
  Filled 2011-11-08 (×2): qty 1

## 2011-11-08 MED ORDER — SODIUM CHLORIDE 0.9 % IV SOLN: INTRAVENOUS | Status: DC

## 2011-11-08 MED ORDER — LEVOTHYROXINE SODIUM 50 MCG PO TABS
50.0000 ug | ORAL_TABLET | ORAL | Status: DC
  Administered 2011-11-09: 50 ug via ORAL
  Filled 2011-11-08 (×2): qty 1

## 2011-11-08 MED ORDER — NITROGLYCERIN 0.4 MG SL SUBL: 0.4000 mg | SUBLINGUAL_TABLET | SUBLINGUAL | Status: DC | PRN

## 2011-11-08 MED ORDER — CARVEDILOL 6.25 MG PO TABS
6.2500 mg | ORAL_TABLET | Freq: Two times a day (BID) | ORAL | Status: DC
  Administered 2011-11-09 (×2): 6.25 mg via ORAL
  Filled 2011-11-08 (×3): qty 1

## 2011-11-08 NOTE — Progress Notes (Signed)
Echocardiogram 2D Echocardiogram has been performed.  Corey Jordan R 11/08/2011, 4:07 PM

## 2011-11-08 NOTE — Progress Notes (Signed)
ANTICOAGULATION CONSULT NOTE - Follow Up Consult  Pharmacy Consult for  Heparin Indication: chest pain/ACS  No Known Allergies  Patient Measurements: Height: 5\' 8"  (172.7 cm) Weight: 144 lb 13.5 oz (65.7 kg) IBW/kg (Calculated) : 68.4    Vital Signs: Temp: 97.5 F (36.4 C) (12/16 0408) Temp src: Oral (12/16 0408) BP: 115/64 mmHg (12/16 0408) Pulse Rate: 69  (12/16 0408)  Labs:  Basename 11/08/11 0547 11/07/11 1240 11/07/11 0849 11/07/11 0530 11/07/11 0500 11/07/11 0028 11/06/11 1825 11/06/11 1120  HGB 12.6* -- -- -- 12.6* -- -- --  HCT 38.0* -- -- -- 39.5 -- -- 41.2  PLT 139* -- -- -- 146* -- -- 169  APTT -- -- -- -- -- -- -- --  LABPROT -- -- -- -- -- -- 13.9 --  INR -- -- -- -- -- -- 1.05 --  HEPARINUNFRC 0.38 -- 0.67 -- -- -- -- --  CREATININE 0.99 -- -- 1.11 -- -- -- 1.12  CKTOTAL -- 112 -- 91 -- 99 -- --  CKMB -- 2.6 -- 2.5 -- 2.7 -- --  TROPONINI -- <0.30 -- <0.30 -- <0.30 -- --   Estimated Creatinine Clearance: 59.9 ml/min (by C-G formula based on Cr of 0.99).     Assessment: Pt admitted for Cp x 3 days.  No ECG changes and neg cardiac enzymes.  CP  improved with NTG drip.  Heparin drip 850 uts/hr HL 0.38 at goal 0.3-0.7.  No bleeding noted, CBC stable.  Renal fx and lytes stable. Meds ok PTA and here - asa,clop,coreg,rami,spiro,vytorin.  Plan cath and echo  Goal of Therapy:  Heparin level 0.3-0.7 units/ml   Plan:  Heparin drip 850 uts/hr  Check daily HL,CBC  Corey Jordan 11/08/2011,10:58 AM

## 2011-11-08 NOTE — Progress Notes (Signed)
 SUBJECTIVE: Chest pain has mostly subsided now.  No problems overnight.  Cardiac enzymes remained negative.      . aspirin EC  81 mg Oral Daily  . carvedilol  6.25 mg Oral BID WC  . clopidogrel  Corey mg Oral Q breakfast  . ezetimibe-simvastatin  1 tablet Oral q1800  . heparin  4,000 Units Intravenous Once  . ramipril  2.5 mg Oral Daily  . sodium chloride  3 mL Intravenous Q12H  . sodium chloride  3 mL Intravenous Q12H  . spironolactone  12.5 mg Oral Daily  . DISCONTD: aspirin  81 mg Oral Daily     Filed Vitals:   11/07/11 1400 11/07/11 1603 11/07/11 2036 11/08/11 0408  BP: 123/72 118/71 102/57 115/64  Pulse: 64 57 67 69  Temp: 97.4 F (36.3 C)  97.7 F (36.5 C) 97.5 F (36.4 C)  TempSrc: Oral  Oral Oral  Resp: 18  20 18  Height:      Weight:    65.7 kg (144 lb 13.5 oz)  SpO2: 96%  95% 96%    Intake/Output Summary (Last 24 hours) at 11/08/11 0744 Last data filed at 11/08/11 0300  Gross per 24 hour  Intake 1531.42 ml  Output   2050 ml  Net -518.58 ml    LABS: Basic Metabolic Panel:  Basename 11/08/11 0547 11/07/11 0530  NA 139 138  K 4.0 4.4  CL 104 101  CO2 27 30  GLUCOSE 97 90  BUN 17 14  CREATININE 0.99 1.11  CALCIUM 9.9 9.6  MG -- --  PHOS -- --   Liver Function Tests: No results found for this basename: AST:2,ALT:2,ALKPHOS:2,BILITOT:2,PROT:2,ALBUMIN:2 in the last 72 hours No results found for this basename: LIPASE:2,AMYLASE:2 in the last 72 hours CBC:  Basename 11/08/11 0547 11/07/11 0500  WBC 5.5 4.9  NEUTROABS -- --  HGB 12.6* 12.6*  HCT 38.0* 39.5  MCV 92.7 93.8  PLT 139* 146*   Cardiac Enzymes:  Basename 11/07/11 1240 11/07/11 0530 11/07/11 0028  CKTOTAL 112 91 99  CKMB 2.6 2.5 2.7  CKMBINDEX -- -- --  TROPONINI <0.30 <0.30 <0.30   BNP: No components found with this basename: POCBNP:3 D-Dimer: No results found for this basename: DDIMER:2 in the last 72 hours Hemoglobin A1C: No results found for this basename: HGBA1C in the last  72 hours Fasting Lipid Panel:  Basename 11/07/11 0500  CHOL 140  HDL 71  LDLCALC 61  TRIG 41  CHOLHDL 2.0  LDLDIRECT --   Thyroid Function Tests:  Basename 11/06/11 1825  TSH 3.213  T4TOTAL --  T3FREE --  THYROIDAB --   Anemia Panel: No results found for this basename: VITAMINB12,FOLATE,FERRITIN,TIBC,IRON,RETICCTPCT in the last 72 hours  RADIOLOGY: Dg Chest Port 1 View  11/06/2011  HEWJOHN B                                                                                                                                                                                                         *  RADIOLOGY REPORT*  Clinical Data: Chest pain.  CHEST - 1 VIEW  Comparison:  11/18/2010.  Findings: Trachea is midline.  Heart size stable.  Single AICD lead tip projects over the right ventricle.  Lungs are clear.  No pleural fluid.  IMPRESSION: No acute findings.  Original Report Authenticated By: MELINDA A. BLIETZ, M.D.    PHYSICAL EXAM General: NAD Neck: No JVD, no thyromegaly or thyroid nodule.  Lungs: Clear to auscultation bilaterally with normal respiratory effort. CV: Nondisplaced PMI.  Heart regular S1/S2, +S4, no murmur.  No peripheral edema.  No carotid bruit. Abdomen: Soft, nontender, no hepatosplenomegaly, no distention.  Neurologic: Alert and oriented x 3.  Psych: Normal affect. Extremities: No clubbing or cyanosis.   TELEMETRY: Reviewed telemetry pt in NSR:  ASSESSMENT AND PLAN: Corey Jordan with history of CAD s/p distant anterior MI and ischemic cardiomyopathy presented with chest pain.  1. CAD: Chest pain most of day yesterday, essentially resolved now.  Cardiac enzymes remained negative.  ECG abnormal but unchanged from prior. - Cath on Monday. - Treat with ASA, Plavix, heparin gtt, Vytorin, Coreg, ramipril. 2. Ischemic cardiomyopathy: Patient appears euvolemic on exam.  No recent assessment of LV systolic function.  Will get echo.  Continue Coreg, ramipril, spironolactone.    [name] 11/08/2011 7:44 AM 

## 2011-11-09 ENCOUNTER — Encounter (HOSPITAL_COMMUNITY)
Admission: EM | Disposition: A | Discharge status: Home / Self Care | Payer: Self-pay | Source: Home / Self Care | Attending: Cardiology

## 2011-11-09 DIAGNOSIS — I2510 Atherosclerotic heart disease of native coronary artery without angina pectoris: Secondary | ICD-10-CM

## 2011-11-09 HISTORY — PX: LEFT HEART CATHETERIZATION WITH CORONARY ANGIOGRAM: SHX5451

## 2011-11-09 LAB — BASIC METABOLIC PANEL
BUN: 17 mg/dL (ref 6–23)
CO2: 30 mEq/L (ref 19–32)
Chloride: 104 mEq/L (ref 96–112)
Glucose, Bld: 99 mg/dL (ref 70–99)
Potassium: 4.2 mEq/L (ref 3.5–5.1)
Sodium: 141 mEq/L (ref 135–145)

## 2011-11-09 LAB — CBC
HCT: 41.6 % (ref 39.0–52.0)
Hemoglobin: 13.3 g/dL (ref 13.0–17.0)
MCV: 93.3 fL (ref 78.0–100.0)
RBC: 4.46 MIL/uL (ref 4.22–5.81)
WBC: 5.3 10*3/uL (ref 4.0–10.5)

## 2011-11-09 SURGERY — LEFT HEART CATHETERIZATION WITH CORONARY ANGIOGRAM
Anesthesia: LOCAL

## 2011-11-09 MED ORDER — SODIUM CHLORIDE 0.9 % IJ SOLN
3.0000 mL | Freq: Two times a day (BID) | INTRAMUSCULAR | Status: DC
  Administered 2011-11-09: 3 mL via INTRAVENOUS

## 2011-11-09 MED ORDER — SODIUM CHLORIDE 0.9 % IJ SOLN: 3.0000 mL | INTRAMUSCULAR | Status: DC | PRN

## 2011-11-09 MED ORDER — SODIUM CHLORIDE 0.9 % IV SOLN
1.0000 mL/kg/h | INTRAVENOUS | Status: DC
  Administered 2011-11-09: 1 mL/kg/h via INTRAVENOUS

## 2011-11-09 MED ORDER — LIDOCAINE HCL (PF) 1 % IJ SOLN
INTRAMUSCULAR | Status: AC
  Filled 2011-11-09: qty 30

## 2011-11-09 MED ORDER — HEPARIN (PORCINE) IN NACL 2-0.9 UNIT/ML-% IJ SOLN
INTRAMUSCULAR | Status: AC
  Filled 2011-11-09: qty 2000

## 2011-11-09 MED ORDER — SODIUM CHLORIDE 0.9 % IV BOLUS (SEPSIS)
250.0000 mL | Freq: Once | INTRAVENOUS | Status: AC
  Administered 2011-11-09: 250 mL via INTRAVENOUS

## 2011-11-09 MED ORDER — ASPIRIN EC 325 MG PO TBEC
325.0000 mg | DELAYED_RELEASE_TABLET | Freq: Every day | ORAL | Status: DC
  Administered 2011-11-09: 325 mg via ORAL
  Filled 2011-11-09: qty 1

## 2011-11-09 MED ORDER — MIDAZOLAM HCL 2 MG/2ML IJ SOLN
INTRAMUSCULAR | Status: AC
  Filled 2011-11-09: qty 2

## 2011-11-09 MED ORDER — SODIUM CHLORIDE 0.9 % IV SOLN: 250.0000 mL | INTRAVENOUS | Status: DC | PRN

## 2011-11-09 MED ORDER — FENTANYL CITRATE 0.05 MG/ML IJ SOLN
INTRAMUSCULAR | Status: AC
  Filled 2011-11-09: qty 2

## 2011-11-09 MED ORDER — ACETAMINOPHEN 325 MG PO TABS: 650.0000 mg | ORAL_TABLET | ORAL | Status: DC | PRN

## 2011-11-09 MED ORDER — ONDANSETRON HCL 4 MG/2ML IJ SOLN: 4.0000 mg | Freq: Four times a day (QID) | INTRAMUSCULAR | Status: DC | PRN

## 2011-11-09 MED ORDER — VERAPAMIL HCL 2.5 MG/ML IV SOLN
INTRAVENOUS | Status: AC
Start: 2011-11-09 — End: 2011-11-09
  Filled 2011-11-09: qty 2

## 2011-11-09 MED ORDER — NITROGLYCERIN 0.2 MG/ML ON CALL CATH LAB
INTRAVENOUS | Status: AC
  Filled 2011-11-09: qty 1

## 2011-11-09 MED ORDER — OXYCODONE-ACETAMINOPHEN 5-325 MG PO TABS: 1.0000 | ORAL_TABLET | ORAL | Status: DC | PRN

## 2011-11-09 NOTE — Brief Op Note (Signed)
See op note.  Patent stent to LAD with 70-80% D1 proximal which is old and unchanged from 2010.  Continue medical Rx Case done radially

## 2011-11-09 NOTE — Progress Notes (Signed)
Pt's tele d/c, pt's IV d/c. Patient verbalizes understanding of discharge information and medications_________________________________________________________________________________________________D. Manson Passey RN

## 2011-11-09 NOTE — Discharge Summary (Signed)
Discharge Summary   Patient ID: Corey Jordan MRN: [id number], DOB/AGE: 1934/01/07 77 y.o.  Primary MD: Margaree Mackintosh, MD Primary Cardiologist: Valera Castle MD  Admit date: 11/06/2011 D/C date:     11/09/2011      Primary Discharge Diagnoses:  1. Unstable Angina  - negative enzymes x 3   - Left heart cath 11/09/11 with patent LAD stents and unchanged proximal D1 stenosis  - Continue medical therapy w/ ASA, plavix, BB, statin, nitro   Secondary Discharge Diagnoses:  1. CAD (Ant MI '04 s/p DES x2 to LAD; cath 11/09/11 as above) 2. Chronic Systolic CHF 2/2 ICM (EF 35-40%; s/p MDT Maximo VR single lead ICD '07) 3. HTN 4. HLD - LDL 61  Allergies No Known Allergies  Diagnostic Studies/Procedures:  11/08/11 - 2D Echocardiogram - Left ventricle: The cavity size was normal. Wall thickness was normal. Systolic function was moderately to severely reduced. The estimated ejection fraction was in the range of 30% to 35%. Akinesis of the mid inferoseptal and anteroseptal walls, the apical septal/lateral/anterior/inferior walls, the true apex. No LV thrombus noted. Doppler parameters are consistent with abnormal left ventricular relaxation (grade 1 diastolic dysfunction).  - No significant valvular abnormalities.  - Normal RV size and systolic function  11/09/11 - Left Heart Cath, Selective Coronary Angiography, LV angiography Hemodynamics:  AO 104 55  LV 105 05  Coronary dominance: right  Left mainstem: Normal  Left anterior descending (LAD): Patent stent in proximal vessel. 20-30% mid wraps apex.  D1: 70-80% ostial No change compared to 2010  Left circumflex (LCx): normal  OM1: small and normal  OM2: large and normal  Right coronary artery (RCA): 20-30% proximal. 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  Ostial D1 lesion is old and stable.  Recommendations: Continue medical RX   History of Present Illness: 77yom w/ PMHx significant for CAD (Ant MI '04 s/p DES x2 to LAD; cath '10 - patent LAD stents w/ 90% D1 stenosis (small - unchanged), Systolic CHF 2/2 ICM (EF 40% by VGram '10, s/p single lead ICD '07), HTN, and HLD who presented to [facility] on 11/06/11 with recurrent chest pain.  Hospital Course: In the ED his EKG showed TWI in I, aVL, V1-V4, which was unchanged from prior EKG and otherwise no acute ischemic changes. His initial troponin was negative and CXR was without acute cardiopulmonary findings. He was given SL NTG with reduction of his chest pain, placed on heparin drip, and admitted for further evaluation and treatment.  Cardiac enzymes were cycled and remained negative and repeat EKG showed TWI I, aVL, V2, V3. His chest pain remained at a 1/10 and NTG drip was initiated. An echocardiogram was obtained with findings as noted above. He underwent left heart catheterization on 11/09/11 with findings as noted above with recommendations to continue medical therapy. He tolerated the procedure well and was chest pain free on day of discharge.  He was discharged in stable condition to home with plans to continue his home medication regimen and follow up with Dr. Daleen Squibb in ~ 3 weeks.  Discharge Vitals: Blood pressure 117/65, pulse 69, temperature 97.4 F (36.3 C), temperature source Oral, resp. rate 18, height 5\' 8"  (1.727 m), weight 66.4 kg (146 lb 6.2 oz), SpO2 94.00%.  Labs: Lab Results  Component Value Date   WBC 5.3  11/09/2011   HGB 13.3 11/09/2011   HCT 41.6 11/09/2011   MCV 93.3 11/09/2011   PLT 173 11/09/2011    Lab 11/09/11 0630  NA 141  K 4.2  CL 104  CO2 30  BUN 17  CREATININE 1.12  CALCIUM 10.1  GLUCOSE 99   Basename 11/07/11 1240 11/07/11 0530 11/07/11 0028 11/06/11 1826  CKTOTAL 112 91 99 120  CKMB 2.6 2.5 2.7 2.9  TROPONINI <0.30 <0.30 <0.30 <0.30   Lab Results   Component Value Date   CHOL 140 11/07/2011   HDL 71 11/07/2011   LDLCALC 61 11/07/2011   TRIG 41 11/07/2011     11/06/2011 18:25  TSH 3.213    Discharge Medications   Current Discharge Medication List    CONTINUE these medications which have NOT CHANGED   Details  aspirin 81 MG EC tablet Take 81 mg by mouth daily.      calcium carbonate (OS-CAL) 600 MG TABS Take 600 mg by mouth 2 (two) times daily with a meal.      carvedilol (COREG) 6.25 MG tablet Take 1 tablet (6.25 mg total) by mouth 2 (two) times daily with a meal. Qty: 180 tablet, Refills: 1    cholecalciferol (VITAMIN D) 1000 UNITS tablet Take 2,000 Units by mouth daily.      ezetimibe-simvastatin (VYTORIN) 10-40 MG per tablet Take 1 tablet by mouth at bedtime. Qty: 90 tablet, Refills: 3    levothyroxine (SYNTHROID, LEVOTHROID) 50 MCG tablet Take 50 mcg by mouth every morning.     nitroGLYCERIN (NITROSTAT) 0.4 MG SL tablet Place 0.4 mg under the tongue every 5 (five) minutes as needed. For chest pain    omeprazole (PRILOSEC) 20 MG capsule Take 20 mg by mouth daily.      ramipril (ALTACE) 2.5 MG capsule Take 2.5 mg by mouth every morning.      spironolactone (ALDACTONE) 25 MG tablet Take 12.5 mg by mouth daily.     clopidogrel (PLAVIX) 75 MG tablet Take 1 tablet (75 mg total) by mouth daily. Qty: 90 tablet, Refills: 2      STOP taking these medications     calcium-vitamin D (OSCAL WITH D) 500-200 MG-UNIT per tablet         Disposition    Follow-up Information    Follow up with Margaree Mackintosh, MD. Make an appointment in 2 weeks.      Follow up with Tereso Newcomer, PA on 11/27/2011. (11:30)    Contact information:   [facility] [address] [place] [address] [phone]           Outstanding Labs/Studies: None  Duration of Discharge Encounter: Greater than 30 minutes including physician and PA time.  Signed, Londa Mackowski PA-C 11/09/2011, 10:27 AM

## 2011-11-09 NOTE — Op Note (Signed)
Cardiac Catheterization Procedure Note  Name: Corey Jordan MRN: [id number] DOB: Feb 10, 1934  Procedure: Left Heart Cath, Selective Coronary Angiography, LV angiography  Indication: Chest Pain   Procedural Details: The right wrist was prepped, draped, and anesthetized with 1% lidocaine. Using the modified Seldinger technique, a 5 French sheath was introduced into the right radial artery. 3 mg of verapamil was administered through the sheath, weight-based unfractionated heparin was administered intravenously. Standard Judkins catheters were used for selective coronary angiography and left ventriculography. Catheter exchanges were performed over an exchange length guidewire. There were no immediate procedural complications. A TR band was used for radial hemostasis at the completion of the procedure.  The patient was transferred to the post catheterization recovery area for further monitoring.  Procedural Findings: Hemodynamics: AO 104 55 LV 105 05  Coronary angiography: Coronary dominance: right  Left mainstem: Normal  Left anterior descending (LAD): Patent stent in proximal vessel.  20-30% mid wraps apex.  D1: 70-80% ostial  No change compared to 2010  Left circumflex (LCx): normal  OM1: small and normal  OM2: large and normal  Right coronary artery (RCA): 20-30% proximal.  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  Ostial D1 lesion is old and stable.    Recommendations: Continue medical RX  Charlton Haws 11/09/2011, 8:19 AM

## 2011-11-09 NOTE — Discharge Summary (Signed)
See cath note from today. Stable CAD Medical Rx Charlton Haws 11/09/2011

## 2011-11-09 NOTE — H&P (View-Only) (Signed)
SUBJECTIVE: Chest pain has mostly subsided now.  No problems overnight.  Cardiac enzymes remained negative.      Marland Kitchen aspirin EC  81 mg Oral Daily  . carvedilol  6.25 mg Oral BID WC  . clopidogrel  75 mg Oral Q breakfast  . ezetimibe-simvastatin  1 tablet Oral q1800  . heparin  4,000 Units Intravenous Once  . ramipril  2.5 mg Oral Daily  . sodium chloride  3 mL Intravenous Q12H  . sodium chloride  3 mL Intravenous Q12H  . spironolactone  12.5 mg Oral Daily  . DISCONTD: aspirin  81 mg Oral Daily     Filed Vitals:   11/07/11 1400 11/07/11 1603 11/07/11 2036 11/08/11 0408  BP: 123/72 118/71 102/57 115/64  Pulse: 64 57 67 69  Temp: 97.4 F (36.3 C)  97.7 F (36.5 C) 97.5 F (36.4 C)  TempSrc: Oral  Oral Oral  Resp: 18  20 18   Height:      Weight:    65.7 kg (144 lb 13.5 oz)  SpO2: 96%  95% 96%    Intake/Output Summary (Last 24 hours) at 11/08/11 0744 Last data filed at 11/08/11 0300  Gross per 24 hour  Intake 1531.42 ml  Output   2050 ml  Net -518.58 ml    LABS: Basic Metabolic Panel:  Basename 11/08/11 0547 11/07/11 0530  NA 139 138  K 4.0 4.4  CL 104 101  CO2 27 30  GLUCOSE 97 90  BUN 17 14  CREATININE 0.99 1.11  CALCIUM 9.9 9.6  MG -- --  PHOS -- --   Liver Function Tests: No results found for this basename: AST:2,ALT:2,ALKPHOS:2,BILITOT:2,PROT:2,ALBUMIN:2 in the last 72 hours No results found for this basename: LIPASE:2,AMYLASE:2 in the last 72 hours CBC:  Basename 11/08/11 0547 11/07/11 0500  WBC 5.5 4.9  NEUTROABS -- --  HGB 12.6* 12.6*  HCT 38.0* 39.5  MCV 92.7 93.8  PLT 139* 146*   Cardiac Enzymes:  Basename 11/07/11 1240 11/07/11 0530 11/07/11 0028  CKTOTAL 112 91 99  CKMB 2.6 2.5 2.7  CKMBINDEX -- -- --  TROPONINI <0.30 <0.30 <0.30   BNP: No components found with this basename: POCBNP:3 D-Dimer: No results found for this basename: DDIMER:2 in the last 72 hours Hemoglobin A1C: No results found for this basename: HGBA1C in the last  72 hours Fasting Lipid Panel:  Basename 11/07/11 0500  CHOL 140  HDL 71  LDLCALC 61  TRIG 41  CHOLHDL 2.0  LDLDIRECT --   Thyroid Function Tests:  Basename 11/06/11 1825  TSH 3.213  T4TOTAL --  T3FREE --  THYROIDAB --   Anemia Panel: No results found for this basename: VITAMINB12,FOLATE,FERRITIN,TIBC,IRON,RETICCTPCT in the last 72 hours  RADIOLOGY: Dg Chest Port 1 View  11/06/2011  HEWJOHN B                                                                                                                                                                                                         *  RADIOLOGY REPORT*  Clinical Data: Chest pain.  CHEST - 1 VIEW  Comparison:  11/18/2010.  Findings: Trachea is midline.  Heart size stable.  Single AICD lead tip projects over the right ventricle.  Lungs are clear.  No pleural fluid.  IMPRESSION: No acute findings.  Original Report Authenticated By: Reyes Ivan, M.D.    PHYSICAL EXAM General: NAD Neck: No JVD, no thyromegaly or thyroid nodule.  Lungs: Clear to auscultation bilaterally with normal respiratory effort. CV: Nondisplaced PMI.  Heart regular S1/S2, +S4, no murmur.  No peripheral edema.  No carotid bruit. Abdomen: Soft, nontender, no hepatosplenomegaly, no distention.  Neurologic: Alert and oriented x 3.  Psych: Normal affect. Extremities: No clubbing or cyanosis.   TELEMETRY: Reviewed telemetry pt in NSR:  ASSESSMENT AND PLAN: 77 yo with history of CAD s/p distant anterior MI and ischemic cardiomyopathy presented with chest pain.  1. CAD: Chest pain most of day yesterday, essentially resolved now.  Cardiac enzymes remained negative.  ECG abnormal but unchanged from prior. - Cath on Monday. - Treat with ASA, Plavix, heparin gtt, Vytorin, Coreg, ramipril. 2. Ischemic cardiomyopathy: Patient appears euvolemic on exam.  No recent assessment of LV systolic function.  Will get echo.  Continue Coreg, ramipril, spironolactone.    Marca Ancona 11/08/2011 7:44 AM

## 2011-11-09 NOTE — Interval H&P Note (Signed)
History and Physical Interval Note:  11/09/2011 7:59 AM  Corey Jordan  has presented today for surgery, with the diagnosis of chest pain  The various methods of treatment have been discussed with the patient and family. After consideration of risks, benefits and other options for treatment, the patient has consented to  Procedure(s): LEFT HEART CATHETERIZATION WITH CORONARY ANGIOGRAM as a surgical intervention .  The patients' history has been reviewed, patient examined, no change in status, stable for surgery.  I have reviewed the patients' chart and labs.  Questions were answered to the patient's satisfaction.     Charlton Haws 7:59 AM 11/09/2011

## 2011-11-26 ENCOUNTER — Ambulatory Visit (INDEPENDENT_AMBULATORY_CARE_PROVIDER_SITE_OTHER): Payer: Medicare Other | Admitting: Internal Medicine

## 2011-11-26 ENCOUNTER — Encounter: Payer: Self-pay | Admitting: Internal Medicine

## 2011-11-26 VITALS — BP 160/84 | HR 76 | Temp 99.1°F | Wt 151.0 lb

## 2011-11-26 DIAGNOSIS — I2510 Atherosclerotic heart disease of native coronary artery without angina pectoris: Secondary | ICD-10-CM | POA: Diagnosis not present

## 2011-11-26 DIAGNOSIS — E039 Hypothyroidism, unspecified: Secondary | ICD-10-CM

## 2011-11-26 DIAGNOSIS — K219 Gastro-esophageal reflux disease without esophagitis: Secondary | ICD-10-CM

## 2011-11-26 DIAGNOSIS — I10 Essential (primary) hypertension: Secondary | ICD-10-CM | POA: Diagnosis not present

## 2011-11-26 NOTE — Progress Notes (Signed)
  Subjective:    Patient ID: Corey Jordan, male    DOB: 02-24-1934, 78 y.o.   MRN: [id number]  HPI recently hospitalized with chest pain. Long-standing history of coronary artery disease and has had multiple interventions. MI was ruled out. He had a radial artery approach for his catheterization right arm. Has some bruising right forearm. Patient reports that no further interventions were necessary during his recent hospitalization. He's here for followup. He does take omeprazole for GE reflux and needs new prescription. Has not had respiratory infection during this flu season. Did take influenza immunization.    Review of Systems     Objective:   Physical Exam neck is supple; chest clear to auscultation; cardiac exam regular rate and rhythm; extremities without edema        Assessment & Plan:  Coronary artery disease status post multiple interventions with recent catheterization while hospitalized for chest pain. MI was ruled out. GE reflux Plan: New prescription for omeprazole 20 mg #90 one by mouth daily with when necessary 1 year refill as he requested return for routine appointments every 6 months

## 2011-11-26 NOTE — Patient Instructions (Addendum)
New prescription written for omeprazole 20 mg daily with refills for one year. Return for regular appointment as previously made or sooner if necessary. Continue same medications.

## 2011-11-27 ENCOUNTER — Encounter: Payer: Medicare Other | Admitting: Physician Assistant

## 2011-11-30 ENCOUNTER — Telehealth: Payer: Self-pay

## 2011-11-30 ENCOUNTER — Ambulatory Visit (INDEPENDENT_AMBULATORY_CARE_PROVIDER_SITE_OTHER): Payer: Medicare Other | Admitting: Internal Medicine

## 2011-11-30 VITALS — Temp 98.2°F

## 2011-11-30 DIAGNOSIS — J069 Acute upper respiratory infection, unspecified: Secondary | ICD-10-CM | POA: Diagnosis not present

## 2011-11-30 NOTE — Telephone Encounter (Signed)
Can see at 4:15 pm

## 2011-11-30 NOTE — Telephone Encounter (Signed)
Now getting what wife had, chills earache little congestion and cough. No fever.

## 2011-12-01 ENCOUNTER — Encounter: Payer: Medicare Other | Admitting: Physician Assistant

## 2011-12-02 ENCOUNTER — Ambulatory Visit (INDEPENDENT_AMBULATORY_CARE_PROVIDER_SITE_OTHER): Payer: Medicare Other | Admitting: *Deleted

## 2011-12-02 ENCOUNTER — Ambulatory Visit (INDEPENDENT_AMBULATORY_CARE_PROVIDER_SITE_OTHER): Payer: Medicare Other | Admitting: Physician Assistant

## 2011-12-02 ENCOUNTER — Encounter: Payer: Self-pay | Admitting: Internal Medicine

## 2011-12-02 ENCOUNTER — Encounter: Payer: Self-pay | Admitting: Physician Assistant

## 2011-12-02 VITALS — BP 132/72 | HR 64 | Ht 68.0 in | Wt 152.0 lb

## 2011-12-02 DIAGNOSIS — I10 Essential (primary) hypertension: Secondary | ICD-10-CM | POA: Diagnosis not present

## 2011-12-02 DIAGNOSIS — I428 Other cardiomyopathies: Secondary | ICD-10-CM

## 2011-12-02 DIAGNOSIS — E785 Hyperlipidemia, unspecified: Secondary | ICD-10-CM

## 2011-12-02 DIAGNOSIS — I2589 Other forms of chronic ischemic heart disease: Secondary | ICD-10-CM

## 2011-12-02 DIAGNOSIS — I2510 Atherosclerotic heart disease of native coronary artery without angina pectoris: Secondary | ICD-10-CM | POA: Diagnosis not present

## 2011-12-02 DIAGNOSIS — I255 Ischemic cardiomyopathy: Secondary | ICD-10-CM

## 2011-12-02 LAB — ICD DEVICE OBSERVATION
BATTERY VOLTAGE: 3.04 V
PACEART VT: 0
TZAT-0001FASTVT: 1
TZAT-0004FASTVT: 8
TZAT-0004SLOWVT: 8
TZAT-0004SLOWVT: 8
TZAT-0005FASTVT: 88 pct
TZAT-0005SLOWVT: 88 pct
TZAT-0011SLOWVT: 10 ms
TZAT-0011SLOWVT: 10 ms
TZAT-0012FASTVT: 200 ms
TZAT-0012SLOWVT: 200 ms
TZAT-0012SLOWVT: 200 ms
TZAT-0013FASTVT: 1
TZAT-0019SLOWVT: 8 V
TZAT-0019SLOWVT: 8 V
TZAT-0020FASTVT: 1.6 ms
TZON-0003FASTVT: 250 ms
TZON-0003SLOWVT: 350 ms
TZON-0011AFLUTTER: 70
TZST-0001FASTVT: 2
TZST-0001FASTVT: 4
TZST-0001FASTVT: 5
TZST-0001SLOWVT: 4
TZST-0001SLOWVT: 5
TZST-0003FASTVT: 25 J
TZST-0003FASTVT: 35 J
TZST-0003FASTVT: 35 J
TZST-0003SLOWVT: 15 J
TZST-0003SLOWVT: 35 J
VENTRICULAR PACING ICD: 0 pct
VF: 0

## 2011-12-02 MED ORDER — EZETIMIBE-SIMVASTATIN 10-40 MG PO TABS: 1.0000 | ORAL_TABLET | Freq: Every day | ORAL | Status: DC

## 2011-12-02 MED ORDER — NITROGLYCERIN 0.4 MG SL SUBL: 0.4000 mg | SUBLINGUAL_TABLET | SUBLINGUAL | Status: DC | PRN

## 2011-12-02 NOTE — Assessment & Plan Note (Signed)
Lab Results  Component Value Date   CHOL 140 11/07/2011   HDL 71 11/07/2011   LDLCALC 61 11/07/2011   TRIG 41 11/07/2011   CHOLHDL 2.0 11/07/2011    As noted, recent lipids optimal.  Continue current Rx.

## 2011-12-02 NOTE — Assessment & Plan Note (Signed)
Controlled.  Continue current therapy.  

## 2011-12-02 NOTE — Progress Notes (Signed)
[address] [place], [place]  [address] Phone: [phone] Fax:  [phone]  Date:  12/02/2011   Name:  Corey Jordan       DOB:  [birth year] MRN:  [id number]  PCP:  Dr. Lenord Fellers Primary Cardiologist:  Dr. Valera Castle  Primary Electrophysiologist:  Dr. Lewayne Bunting    History of Present Illness: Corey Jordan is a 78 y.o. male who presents for post hospital follow up.  He has a h/o CAD (Ant MI '04 s/p DES x2 to LAD; cath '10 - patent LAD stents w/ 90% D1 stenosis (small - unchanged), Systolic CHF 2/2 ICM (EF 40% by VGram '10, s/p single lead ICD '07), HTN, and HLD.  He was admitted 12/14-12/17 with chest pain.  He ruled out for myocardial infarction.  Echocardiogram 11/08/11: EF 30-35%, inferoseptal and anteroseptal, apical septal/lateral/anterior/inferior and true apical akinesis, no LV thrombus, grade 1 diastolic dysfunction.  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  Medical therapy was recommended.  Doing well.  Has occasional chest pain.  This is fairly chronic.  He takes NTG with relief.  He denies any escalation or increased frequency in chest pain.  No dyspnea, orthopnea, PND or edema.  No syncope.  He is under a great deal of stress with recent hx of multiple skin cancers (non-melanoma).  Denies any relation of chest pain to meals.  Denies dysphagia, odynophagia or water brash.  No exertional chest pain.    Past Medical History  Diagnosis Date  . 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  . Cardiac defibrillator in situ     s/p MDT Maximo  VR single lead ICD - 07/06/2006  . Hyperlipidemia   . Hypertension   . Ischemic cardiomyopathy     EF 40% 12/2008 V gram;  Echocardiogram 11/08/11: EF 30-35%, inferoseptal and anteroseptal, apical septal/lateral/anterior/inferior and true apical akinesis, no LV thrombus, grade 1 diastolic dysfunction.  Marland Kitchen History of orthostatic hypotension   . Degenerative joint disease   . Hypothyroidism   . Premature atrial contractions   . Premature ventricular contractions   . Systolic CHF, chronic   . Lumbar spondylosis     s/p L5-S1 fusion 12/11/2009  . Skin cancer     skin  . H/O hiatal hernia     times 3  . Spondylosis     Current Outpatient Prescriptions  Medication Sig Dispense Refill  . aspirin 81 MG EC tablet Take 81 mg by mouth daily.        Marland Kitchen azithromycin (ZITHROMAX) 1 G powder Take 1 packet by mouth once.      . calcium carbonate (OS-CAL) 600 MG TABS Take 600 mg by mouth 2 (two) times daily with a meal.        . carvedilol (COREG) 6.25 MG tablet Take 1 tablet (6.25 mg total) by mouth 2 (two) times daily with a meal.  180 tablet  1  . cholecalciferol (VITAMIN D) 1000 UNITS tablet Take 2,000 Units by mouth daily.        . clopidogrel (PLAVIX)  75 MG tablet Take 1 tablet (75 mg total) by mouth daily.  90 tablet  2  . ezetimibe-simvastatin (VYTORIN) 10-40 MG per tablet Take 1 tablet by mouth at bedtime.  90 tablet  3  . levothyroxine (SYNTHROID, LEVOTHROID) 50 MCG tablet Take 50 mcg by mouth every morning.       . nitroGLYCERIN (NITROSTAT) 0.4 MG SL tablet Place 0.4 mg under the tongue every 5 (five) minutes as needed. For chest pain      . omeprazole (PRILOSEC) 20 MG capsule Take 20 mg by mouth daily.        . ramipril (ALTACE) 2.5 MG capsule Take 2.5 mg by mouth every morning.        Marland Kitchen spironolactone (ALDACTONE) 25 MG tablet Take 12.5 mg by mouth daily.         Allergies: No Known Allergies  History  Substance Use Topics  . Smoking status: Never Smoker   . Smokeless tobacco: Not on  file  . Alcohol Use: No     ROS:  Please see the history of present illness.   He notes problems with rhinorrhea.  We talked about taking claritin or benadryl prn.  All other systems reviewed and negative.   PHYSICAL EXAM: VS:  BP 132/72  Pulse 64  Ht 5\' 8"  (1.727 m)  Wt 152 lb (68.947 kg)  BMI 23.11 kg/m2 Well nourished, well developed, in no acute distress HEENT: normal Neck: no JVD Cardiac:  normal S1, S2; RRR; no murmur Lungs:  clear to auscultation bilaterally, no wheezing, rhonchi or rales Abd: soft, nontender, no hepatomegaly Ext: no edema; right wrist without hematoma or bruit Skin: warm and dry Neuro:  CNs 2-12 intact, no focal abnormalities noted  EKG:   Sinus rhythm, heart rate 63, normal axis, poor R-wave progression, T wave inversions in V1 through V5, No significant change from prior tracings  ASSESSMENT AND PLAN:

## 2011-12-02 NOTE — Assessment & Plan Note (Signed)
1. Doing well.  Stable anatomy on recent cath.   2. Continue ASA and Plavix.   3. He has chronic chest pain that is fairly stable.  It was worse when he presented to the ED.  But, as noted, he ruled out for MI.  Symptoms atypical for chronic angina and atypical for GI etiology.  May be related to anxiety.  I suspect he has small vessel disease.  We discussed whether or not to start Isosorbide.  He is not interested in this.   4. Continue current Rx and follow up with Dr. Valera Castle in 3 mos.

## 2011-12-02 NOTE — Assessment & Plan Note (Signed)
Volume stable.  EF stable.  Continue current Rx.

## 2011-12-02 NOTE — Patient Instructions (Addendum)
You can take either Claritin or Benadryl as needed for runny nose. Claritin is 10 mg daily as needed. Benadryl is 25 mg and you can take up to every 6 hours as needed. You can get both over the counter. Your physician recommends that you schedule a follow-up appointment in: 3 months with Dr Daleen Squibb Your physician recommends that you schedule a follow-up appointment in: 3 months with [facility]

## 2011-12-02 NOTE — Progress Notes (Signed)
ICD check 

## 2012-01-24 ENCOUNTER — Encounter: Payer: Self-pay | Admitting: Internal Medicine

## 2012-01-24 NOTE — Patient Instructions (Signed)
Take Zithromax Z-Pak 2 tablets day one followed by 1 tablet by mouth days 2 through 5. Take Tessalon Perles if needed for cough.

## 2012-01-24 NOTE — Progress Notes (Signed)
  Subjective:    Patient ID: Corey Jordan, male    DOB: Oct 01, 1934, 78 y.o.   MRN: [id number]  HPI 78 year old white male with URI symptoms. Has runny nose. No fever or shaking chills. History of coronary artery disease. No significant cough. Just he had a nasal congestion.    Review of Systems     Objective:   Physical Exam HEENT exam: TMs are clear; pharynx only slightly injected without exudate; neck is supple without adenopathy. Chest clear.        Assessment & Plan:  URI  Plan: Zithromax Z-Pak take 2 tablets day one followed by 1 tablet by mouth days 2 through 5. Tessalon Perles 100 mg (#60) 2 by mouth 3 times a day when necessary cough

## 2012-01-25 ENCOUNTER — Other Ambulatory Visit: Payer: Self-pay | Admitting: Cardiology

## 2012-02-16 DIAGNOSIS — L570 Actinic keratosis: Secondary | ICD-10-CM | POA: Diagnosis not present

## 2012-02-16 DIAGNOSIS — Z85828 Personal history of other malignant neoplasm of skin: Secondary | ICD-10-CM | POA: Diagnosis not present

## 2012-02-16 DIAGNOSIS — C44611 Basal cell carcinoma of skin of unspecified upper limb, including shoulder: Secondary | ICD-10-CM | POA: Diagnosis not present

## 2012-02-16 DIAGNOSIS — L820 Inflamed seborrheic keratosis: Secondary | ICD-10-CM | POA: Diagnosis not present

## 2012-02-16 DIAGNOSIS — C4441 Basal cell carcinoma of skin of scalp and neck: Secondary | ICD-10-CM | POA: Diagnosis not present

## 2012-02-16 DIAGNOSIS — L821 Other seborrheic keratosis: Secondary | ICD-10-CM | POA: Diagnosis not present

## 2012-02-16 DIAGNOSIS — C44319 Basal cell carcinoma of skin of other parts of face: Secondary | ICD-10-CM | POA: Diagnosis not present

## 2012-03-14 ENCOUNTER — Encounter: Payer: Self-pay | Admitting: Cardiology

## 2012-03-14 ENCOUNTER — Ambulatory Visit (INDEPENDENT_AMBULATORY_CARE_PROVIDER_SITE_OTHER): Payer: Medicare Other | Admitting: Cardiology

## 2012-03-14 ENCOUNTER — Encounter: Payer: Self-pay | Admitting: Internal Medicine

## 2012-03-14 ENCOUNTER — Ambulatory Visit (INDEPENDENT_AMBULATORY_CARE_PROVIDER_SITE_OTHER): Payer: Medicare Other | Admitting: *Deleted

## 2012-03-14 VITALS — BP 132/78 | HR 62 | Ht 68.0 in | Wt 151.1 lb

## 2012-03-14 DIAGNOSIS — Z95810 Presence of automatic (implantable) cardiac defibrillator: Secondary | ICD-10-CM | POA: Diagnosis not present

## 2012-03-14 DIAGNOSIS — I5022 Chronic systolic (congestive) heart failure: Secondary | ICD-10-CM | POA: Diagnosis not present

## 2012-03-14 DIAGNOSIS — I2589 Other forms of chronic ischemic heart disease: Secondary | ICD-10-CM

## 2012-03-14 DIAGNOSIS — I2510 Atherosclerotic heart disease of native coronary artery without angina pectoris: Secondary | ICD-10-CM

## 2012-03-14 DIAGNOSIS — I255 Ischemic cardiomyopathy: Secondary | ICD-10-CM

## 2012-03-14 DIAGNOSIS — I509 Heart failure, unspecified: Secondary | ICD-10-CM

## 2012-03-14 LAB — ICD DEVICE OBSERVATION
BATTERY VOLTAGE: 3.02 V
RV LEAD AMPLITUDE: 17.3 mv
RV LEAD IMPEDENCE ICD: 696 Ohm
TZAT-0004FASTVT: 8
TZAT-0004SLOWVT: 8
TZAT-0004SLOWVT: 8
TZAT-0005FASTVT: 88 pct
TZAT-0005SLOWVT: 91 pct
TZAT-0011FASTVT: 10 ms
TZAT-0011SLOWVT: 10 ms
TZAT-0011SLOWVT: 10 ms
TZAT-0012FASTVT: 200 ms
TZAT-0012SLOWVT: 200 ms
TZAT-0012SLOWVT: 200 ms
TZAT-0013FASTVT: 1
TZON-0003FASTVT: 250 ms
TZON-0003SLOWVT: 350 ms
TZON-0008SLOWVT: 0 ms
TZON-0011AFLUTTER: 70
TZST-0001FASTVT: 2
TZST-0001FASTVT: 3
TZST-0001FASTVT: 5
TZST-0001SLOWVT: 4
TZST-0003FASTVT: 35 J
TZST-0003FASTVT: 35 J
TZST-0003SLOWVT: 15 J
TZST-0003SLOWVT: 35 J
VENTRICULAR PACING ICD: 0.2 pct

## 2012-03-14 MED ORDER — SPIRONOLACTONE 25 MG PO TABS: 12.5000 mg | ORAL_TABLET | Freq: Every day | ORAL | Status: DC

## 2012-03-14 MED ORDER — CLOPIDOGREL BISULFATE 75 MG PO TABS: 75.0000 mg | ORAL_TABLET | Freq: Every day | ORAL | Status: DC

## 2012-03-14 NOTE — Assessment & Plan Note (Signed)
Stable. No change in treatment. 

## 2012-03-14 NOTE — Assessment & Plan Note (Signed)
Checked today by [facility] and is stable.

## 2012-03-14 NOTE — Progress Notes (Signed)
HPI [name] comes in today for evaluation and management of his coronary disease and chronic systolic heart failure. Since his admission in December of 2012, he's had no further discomfort. He carries nitroglycerin. Catheterization at that time showed a patent stent to the LAD with a stable 70-80% stenosis and a diagonal 1 branch. Ejection fraction is 45%.  Past Medical History  Diagnosis Date  . 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  . Cardiac defibrillator in situ     s/p MDT Maximo VR single lead ICD - 07/06/2006  . Hyperlipidemia   . Hypertension   . Ischemic cardiomyopathy     EF 40% 12/2008 V gram;  Echocardiogram 11/08/11: EF 30-35%, inferoseptal and anteroseptal, apical septal/lateral/anterior/inferior and true apical akinesis, no LV thrombus, grade 1 diastolic dysfunction.  Marland Kitchen History of orthostatic hypotension   . Degenerative joint disease   . Hypothyroidism   . Premature atrial contractions   . Premature ventricular contractions   . Systolic CHF, chronic   . Lumbar spondylosis     s/p L5-S1 fusion 12/11/2009  . Skin cancer     skin  . H/O hiatal hernia     times 3  . Spondylosis     Current Outpatient Prescriptions  Medication Sig Dispense Refill  . aspirin 81 MG EC tablet Take 81 mg by mouth daily.        . calcium carbonate (OS-CAL) 600 MG TABS Take 600 mg by mouth 2 (two) times daily with a meal.        . carvedilol (COREG) 6.25 MG tablet TAKE 1 TABLET TWICE A DAY  WITH MEALS  180 tablet  1  . cholecalciferol (VITAMIN D) 1000 UNITS tablet Take 2,000 Units by mouth daily.        . clopidogrel (PLAVIX) 75 MG tablet Take 1 tablet (75 mg total) by mouth daily.  90 tablet  2  .  ezetimibe-simvastatin (VYTORIN) 10-40 MG per tablet Take 1 tablet by mouth at bedtime.  90 tablet  3  . levothyroxine (SYNTHROID, LEVOTHROID) 50 MCG tablet Take 50 mcg by mouth every morning.       . nitroGLYCERIN (NITROSTAT) 0.4 MG SL tablet Place 1 tablet (0.4 mg total) under the tongue every 5 (five) minutes as needed. For chest pain  25 tablet  6  . omeprazole (PRILOSEC) 20 MG capsule Take 20 mg by mouth daily.        . ramipril (ALTACE) 2.5 MG capsule Take 2.5 mg by mouth every morning.        Marland Kitchen spironolactone (ALDACTONE) 25 MG tablet Take 12.5 mg by mouth daily.         No Known Allergies  Family History  Problem Relation Age of Onset  . Pneumonia Mother     deceased 50  . Peripheral vascular disease Father     deceased 39    History   Social History  . Marital Status: Married    Spouse Name: [name]    Number of Children: [name]  . Years of Education: [name]   Occupational History  . retired [name]   Social History Main Topics  . Smoking status: Never Smoker   .  Smokeless tobacco: Not on file  . Alcohol Use: No  . Drug Use: No  . Sexually Active: No   Other Topics Concern  . Not on file   Social History Narrative   Active, walks regularly.    ROS ALL NEGATIVE EXCEPT THOSE NOTED IN HPI  PE  General Appearance: well developed, well nourished in no acute distress HEENT: symmetrical face, PERRLA, good dentition  Neck: no JVD, thyromegaly, or adenopathy, trachea midline Chest: symmetric without deformity Cardiac: PMI non-displaced, RRR, normal S1, S2, no gallop or murmur Lung: clear to ausculation and percussion Vascular: all pulses full without bruits  Abdominal: nondistended, nontender, good bowel sounds, no HSM, no bruits Extremities: no cyanosis, clubbing or edema, no sign of DVT, no varicosities  Skin: normal color, no rashes Neuro: alert and oriented x 3, non-focal Pysch: normal affect  EKG  defibrillator checked and it is stable and  in functioning  normally. BMET    Component Value Date/Time   NA 141 11/09/2011 0630   K 4.2 11/09/2011 0630   CL 104 11/09/2011 0630   CO2 30 11/09/2011 0630   GLUCOSE 99 11/09/2011 0630   BUN 17 11/09/2011 0630   CREATININE 1.12 11/09/2011 0630   CALCIUM 10.1 11/09/2011 0630   GFRNONAA 62* 11/09/2011 0630   GFRAA 72* 11/09/2011 0630    Lipid Panel     Component Value Date/Time   CHOL 140 11/07/2011 0500   TRIG 41 11/07/2011 0500   HDL 71 11/07/2011 0500   CHOLHDL 2.0 11/07/2011 0500   VLDL 8 11/07/2011 0500   LDLCALC 61 11/07/2011 0500    CBC    Component Value Date/Time   WBC 5.3 11/09/2011 0630   RBC 4.46 11/09/2011 0630   HGB 13.3 11/09/2011 0630   HCT 41.6 11/09/2011 0630   PLT 173 11/09/2011 0630   MCV 93.3 11/09/2011 0630   MCH 29.8 11/09/2011 0630   MCHC 32.0 11/09/2011 0630   RDW 12.9 11/09/2011 0630   LYMPHSABS 0.8 12/06/2009 1325   MONOABS 0.7 12/06/2009 1325   EOSABS 0.2 12/06/2009 1325   BASOSABS 0.0 12/06/2009 1325

## 2012-03-14 NOTE — Patient Instructions (Addendum)
Your physician recommends that you schedule a follow-up appointment in: 3 months with Dr. Ladona Ridgel  Your physician recommends that you continue on your current medications as directed. Please refer to the Current Medication list given to you today.  Your physician wants you to follow-up in: 6 months with Dr. Daleen Squibb. You will receive a reminder letter in the mail two months in advance. If you don't receive a letter, please call our office to schedule the follow-up appointment.

## 2012-03-14 NOTE — Progress Notes (Signed)
Pt seen in clinic for follow up of ICD.  No complaints of chest pain, shortness of breath, dizziness, palpitations, or shocks.  Device functioning normally at this time.  For full details, see PaceArt report.  No programming changes made today.  Plan to follow up in 3 months with Dr Annamarie Dawley, RN, BSN 03/14/2012 2:45 PM

## 2012-03-15 DIAGNOSIS — H60399 Other infective otitis externa, unspecified ear: Secondary | ICD-10-CM | POA: Diagnosis not present

## 2012-03-15 DIAGNOSIS — H6120 Impacted cerumen, unspecified ear: Secondary | ICD-10-CM | POA: Diagnosis not present

## 2012-03-21 DIAGNOSIS — H6120 Impacted cerumen, unspecified ear: Secondary | ICD-10-CM | POA: Diagnosis not present

## 2012-03-21 DIAGNOSIS — C44211 Basal cell carcinoma of skin of unspecified ear and external auricular canal: Secondary | ICD-10-CM | POA: Diagnosis not present

## 2012-03-23 DIAGNOSIS — C44319 Basal cell carcinoma of skin of other parts of face: Secondary | ICD-10-CM | POA: Diagnosis not present

## 2012-03-24 ENCOUNTER — Other Ambulatory Visit: Payer: Medicare Other | Admitting: Internal Medicine

## 2012-03-24 DIAGNOSIS — E039 Hypothyroidism, unspecified: Secondary | ICD-10-CM | POA: Diagnosis not present

## 2012-03-24 DIAGNOSIS — Z125 Encounter for screening for malignant neoplasm of prostate: Secondary | ICD-10-CM | POA: Diagnosis not present

## 2012-03-24 DIAGNOSIS — I10 Essential (primary) hypertension: Secondary | ICD-10-CM | POA: Diagnosis not present

## 2012-03-24 DIAGNOSIS — E785 Hyperlipidemia, unspecified: Secondary | ICD-10-CM | POA: Diagnosis not present

## 2012-03-24 LAB — COMPREHENSIVE METABOLIC PANEL
AST: 20 U/L (ref 0–37)
Alkaline Phosphatase: 68 U/L (ref 39–117)
BUN: 15 mg/dL (ref 6–23)
Creat: 1.04 mg/dL (ref 0.50–1.35)
Potassium: 4.4 mEq/L (ref 3.5–5.3)

## 2012-03-24 LAB — CBC WITH DIFFERENTIAL/PLATELET
Basophils Absolute: 0 10*3/uL (ref 0.0–0.1)
Eosinophils Absolute: 0.1 10*3/uL (ref 0.0–0.7)
Lymphs Abs: 0.7 10*3/uL (ref 0.7–4.0)
MCH: 29.7 pg (ref 26.0–34.0)
Neutrophils Relative %: 78 % — ABNORMAL HIGH (ref 43–77)
Platelets: 181 10*3/uL (ref 150–400)
RBC: 4.78 MIL/uL (ref 4.22–5.81)
RDW: 13.5 % (ref 11.5–15.5)
WBC: 8.4 10*3/uL (ref 4.0–10.5)

## 2012-03-24 LAB — LIPID PANEL
HDL: 74 mg/dL (ref >39)
LDL Cholesterol: 83 mg/dL (ref 0–99)
Total CHOL/HDL Ratio: 2.2 Ratio

## 2012-03-24 LAB — TSH: TSH: 2.017 u[IU]/mL (ref 0.350–4.500)

## 2012-03-25 ENCOUNTER — Ambulatory Visit (INDEPENDENT_AMBULATORY_CARE_PROVIDER_SITE_OTHER): Payer: Medicare Other | Admitting: Internal Medicine

## 2012-03-25 ENCOUNTER — Encounter: Payer: Self-pay | Admitting: Internal Medicine

## 2012-03-25 VITALS — BP 150/84 | HR 68 | Temp 97.6°F | Ht 67.5 in | Wt 149.5 lb

## 2012-03-25 DIAGNOSIS — E785 Hyperlipidemia, unspecified: Secondary | ICD-10-CM

## 2012-03-25 DIAGNOSIS — E039 Hypothyroidism, unspecified: Secondary | ICD-10-CM

## 2012-03-25 DIAGNOSIS — I10 Essential (primary) hypertension: Secondary | ICD-10-CM

## 2012-03-25 DIAGNOSIS — K219 Gastro-esophageal reflux disease without esophagitis: Secondary | ICD-10-CM

## 2012-03-25 LAB — POCT URINALYSIS DIPSTICK
Blood, UA: NEGATIVE
Protein, UA: NEGATIVE
Spec Grav, UA: 1.01
Urobilinogen, UA: NEGATIVE
pH, UA: 5.5

## 2012-03-28 DIAGNOSIS — H6120 Impacted cerumen, unspecified ear: Secondary | ICD-10-CM | POA: Diagnosis not present

## 2012-03-28 DIAGNOSIS — H60399 Other infective otitis externa, unspecified ear: Secondary | ICD-10-CM | POA: Diagnosis not present

## 2012-04-24 ENCOUNTER — Encounter: Payer: Self-pay | Admitting: Internal Medicine

## 2012-04-24 NOTE — Patient Instructions (Signed)
Continue same medications and return in 6 months for office visit lipid panel liver functions and TSH 

## 2012-04-24 NOTE — Progress Notes (Signed)
  Subjective:    Patient ID: Corey Jordan, male    DOB: 08-03-1934, 78 y.o.   MRN: [id number]  HPI 78 year old white male with history of GE reflux, history of hypothyroidism, coronary artery disease, ischemic cardiomyopathy with ejection fraction of 30% on catheterization August 2007. History of class II congestive heart failure and dyslipidemia. Patient had implantation of Medtronic  cardioverter defibrillator August 2007. Patient followed by Dr. Sharrell Ku and Dr. Elijah Birk wall.  Health maintenance: Had shingles vaccine April 2011, Pneumovax vaccine January 2010, tetanus vaccine April 2011. Had colonoscopy by Dr. Arlyce Dice October 2005 and is due for repeat study.  Patient has hx of spondylosis and herniated disc with left radiculopathy L5 and S1 status post L5-S1 fusion by Dr. Tia Masker January 2011  History of left inguinal hernia repair by Dr. Shon Hale during the summer of 2000  Patient had MI in 2004 with total occlusion of the LAD which was stented x2. Right coronary artery had 40% proximal lesion. Circumflex was normal. Patient had 80% diagonal lesion. Was noted then to have ejection fraction of 30-35%.  Family history mother died of pneumonia at age 79, father died of heart failure at age 25. One brother died of cancer at age 80. Father had history of venous thrombosis apparently had a leg amputation.  Social history: He is retired from the post office. He is married with 2 adult children. Does not smoke or consume alcohol    Review of Systems  Constitutional: Positive for fatigue.  HENT: Negative.   Respiratory: Negative for cough and shortness of breath.   Cardiovascular: Negative for leg swelling.  Genitourinary: Negative for difficulty urinating.  Musculoskeletal: Positive for back pain and arthralgias.  Hematological: Negative.   Psychiatric/Behavioral: Negative.        Objective:   Physical Exam  Vitals reviewed. Constitutional: He is oriented to person, place, and time. He  appears well-developed and well-nourished. No distress.  HENT:  Head: Normocephalic and atraumatic.  Right Ear: External ear normal.  Left Ear: External ear normal.  Mouth/Throat: Oropharynx is clear and moist. No oropharyngeal exudate.  Eyes: Conjunctivae and EOM are normal. Pupils are equal, round, and reactive to light. Right eye exhibits no discharge. Left eye exhibits no discharge. No scleral icterus.  Neck: Neck supple. No JVD present. No thyromegaly present.  Cardiovascular: Normal rate, regular rhythm, normal heart sounds and intact distal pulses.   No murmur heard. Pulmonary/Chest: Effort normal and breath sounds normal. He has no wheezes. He has no rales.  Abdominal: Soft. Bowel sounds are normal. He exhibits no distension and no mass. There is no tenderness. There is no rebound and no guarding.  Genitourinary: Prostate normal.  Musculoskeletal: He exhibits no edema.  Lymphadenopathy:    He has no cervical adenopathy.  Neurological: He is alert and oriented to person, place, and time. He has normal reflexes. No cranial nerve deficit. Coordination normal.  Skin: Skin is warm and dry. No rash noted. He is not diaphoretic.  Psychiatric: He has a normal mood and affect. His behavior is normal. Judgment and thought content normal.          Assessment & Plan:  Hypothyroidism  GE reflux  Coronary artery disease  Ischemic cardiomyopathy with implantable cardio defibrillator  Hypertension  Dyslipidemia  History of lumbar disc disease  Plan: Patient is due for repeat screening colonoscopy. Fasting labs reviewed. Return in 6 months for office visit, lipid panel liver functions and TSH. Will get influenza immunization at that time.

## 2012-05-30 DIAGNOSIS — H60399 Other infective otitis externa, unspecified ear: Secondary | ICD-10-CM | POA: Diagnosis not present

## 2012-05-30 DIAGNOSIS — H6120 Impacted cerumen, unspecified ear: Secondary | ICD-10-CM | POA: Diagnosis not present

## 2012-05-31 ENCOUNTER — Ambulatory Visit (INDEPENDENT_AMBULATORY_CARE_PROVIDER_SITE_OTHER): Payer: Medicare Other | Admitting: Internal Medicine

## 2012-05-31 ENCOUNTER — Ambulatory Visit
Admission: RE | Admit: 2012-05-31 | Discharge: 2012-05-31 | Disposition: A | Discharge status: Home / Self Care | Payer: Medicare Other | Source: Ambulatory Visit | Attending: Internal Medicine | Admitting: Internal Medicine

## 2012-05-31 ENCOUNTER — Encounter: Payer: Self-pay | Admitting: Internal Medicine

## 2012-05-31 VITALS — BP 148/84 | HR 64 | Temp 97.1°F | Ht 67.5 in | Wt 151.0 lb

## 2012-05-31 DIAGNOSIS — M549 Dorsalgia, unspecified: Secondary | ICD-10-CM

## 2012-05-31 DIAGNOSIS — M545 Low back pain, unspecified: Secondary | ICD-10-CM

## 2012-05-31 DIAGNOSIS — M47817 Spondylosis without myelopathy or radiculopathy, lumbosacral region: Secondary | ICD-10-CM | POA: Diagnosis not present

## 2012-05-31 DIAGNOSIS — M5137 Other intervertebral disc degeneration, lumbosacral region: Secondary | ICD-10-CM | POA: Diagnosis not present

## 2012-05-31 DIAGNOSIS — Z981 Arthrodesis status: Secondary | ICD-10-CM | POA: Diagnosis not present

## 2012-05-31 NOTE — Patient Instructions (Addendum)
Take Sterapred Dosepak hysterectomy for 12 days. Take Vicodin every 8 hours as needed for pain. Take it easy and apply ice to back area for 20 minutes daily. Call Friday, July 12 for progress report.

## 2012-05-31 NOTE — Progress Notes (Signed)
  Subjective:    Patient ID: Corey Jordan, male    DOB: 1934/11/18, 78 y.o.   MRN: [id number]  HPI patient has 2 week history of right paralumbar back pain. Says it starts on the right paralumbar side of his back and radiates across the entire back. Denies any heavy lifting or exercising to have started the pain. He has a history of lumbar disc disease and he has a cardioverter defibrillator. He cannot have MRI studies because of the cardiac device. He has a history of spondylosis and herniated disc with left radiculopathy L5-S1 status post L5-S1 fusion by Dr. Channing Mutters January 2011. Says he has pain when lying down at night and when getting up from a chair. Says the pain is excruciating. He doesn't have any pain medication to try.   Review of Systems     Objective:   Physical Exam he has palpable tenderness over the right posterior superior iliac spine. Straight leg raising on the right is slightly positive negative on the left. Muscle strength in the right lower extremity is 5 over 5. Deep tendon reflexes are 1+ and symmetrical in the right lower extremity. He is able to ambulate without assistance. He moves slowly today.        Assessment & Plan:  Right paralumbar back pain  Plan: Sterapred DS 10 mg 12 day dosepak. Vicodin 5/500 (#60) 1 by mouth every 6 hours when necessary pain. Patient is to call Friday, July 12 with progress report. He may need to have physical therapy. If necessary, we can arrange to refer him back to Dr. Channing Mutters.

## 2012-06-03 ENCOUNTER — Telehealth: Payer: Self-pay | Admitting: Internal Medicine

## 2012-06-03 NOTE — Telephone Encounter (Signed)
Finish medication.  If back pain does not improve over next 3 weeks, we will arrange for him to see Dr. Channing Mutters in [place].

## 2012-06-03 NOTE — Telephone Encounter (Signed)
[phone] - sp w/pt - adv pt to finish his meds and if not better in 3 weeks...we'll refer him to Dr. Channing Mutters in [place].  Pt verbalizes understanding.

## 2012-06-21 ENCOUNTER — Ambulatory Visit (INDEPENDENT_AMBULATORY_CARE_PROVIDER_SITE_OTHER): Payer: Medicare Other | Admitting: Internal Medicine

## 2012-06-21 ENCOUNTER — Encounter: Payer: Self-pay | Admitting: Internal Medicine

## 2012-06-21 VITALS — BP 124/74 | HR 64 | Ht 68.0 in | Wt 149.4 lb

## 2012-06-21 DIAGNOSIS — Z95810 Presence of automatic (implantable) cardiac defibrillator: Secondary | ICD-10-CM

## 2012-06-21 DIAGNOSIS — I509 Heart failure, unspecified: Secondary | ICD-10-CM | POA: Diagnosis not present

## 2012-06-21 DIAGNOSIS — I255 Ischemic cardiomyopathy: Secondary | ICD-10-CM

## 2012-06-21 DIAGNOSIS — I2510 Atherosclerotic heart disease of native coronary artery without angina pectoris: Secondary | ICD-10-CM | POA: Diagnosis not present

## 2012-06-21 DIAGNOSIS — I5022 Chronic systolic (congestive) heart failure: Secondary | ICD-10-CM

## 2012-06-21 DIAGNOSIS — I2589 Other forms of chronic ischemic heart disease: Secondary | ICD-10-CM | POA: Diagnosis not present

## 2012-06-21 LAB — ICD DEVICE OBSERVATION
BATTERY VOLTAGE: 3 V
BRDY-0002RV: 40 {beats}/min
CHARGE TIME: 8.56 s
TZAT-0001FASTVT: 1
TZAT-0004FASTVT: 8
TZAT-0012SLOWVT: 200 ms
TZAT-0013FASTVT: 1
TZAT-0018FASTVT: NEGATIVE
TZAT-0018SLOWVT: NEGATIVE
TZAT-0018SLOWVT: NEGATIVE
TZAT-0019FASTVT: 8 V
TZAT-0019SLOWVT: 8 V
TZAT-0019SLOWVT: 8 V
TZAT-0020FASTVT: 1.6 ms
TZAT-0020SLOWVT: 1.6 ms
TZAT-0020SLOWVT: 1.6 ms
TZON-0008SLOWVT: 0 ms
TZST-0001FASTVT: 2
TZST-0001FASTVT: 4
TZST-0001FASTVT: 6
TZST-0001SLOWVT: 5
TZST-0003FASTVT: 35 J
TZST-0003FASTVT: 35 J
TZST-0003SLOWVT: 15 J
TZST-0003SLOWVT: 35 J
VENTRICULAR PACING ICD: 0 pct

## 2012-06-21 NOTE — Progress Notes (Signed)
HPI [name] returns today for followup. He is a very pleasant 78 year old man with an ischemic cardiomyopathy, status post multiple stents, status post ICD implantation. He has chronic class II congestive heart failure. He has minimal peripheral edema. He denies chest pain. He has class II heart failure symptoms. No syncope or recent ICD shock. No Known Allergies   Current Outpatient Prescriptions  Medication Sig Dispense Refill  . aspirin 81 MG EC tablet Take 81 mg by mouth daily.        . calcium carbonate (OS-CAL) 600 MG TABS Take 600 mg by mouth 2 (two) times daily with a meal.        . carvedilol (COREG) 6.25 MG tablet TAKE 1 TABLET TWICE A DAY  WITH MEALS  180 tablet  1  . cholecalciferol (VITAMIN D) 1000 UNITS tablet Take 2,000 Units by mouth daily.        . clopidogrel (PLAVIX) 75 MG tablet Take 1 tablet (75 mg total) by mouth daily.  90 tablet  3  . ezetimibe-simvastatin (VYTORIN) 10-40 MG per tablet Take 1 tablet by mouth at bedtime.  90 tablet  3  . levothyroxine (SYNTHROID, LEVOTHROID) 50 MCG tablet Take 50 mcg by mouth every morning.       . nitroGLYCERIN (NITROSTAT) 0.4 MG SL tablet Place 1 tablet (0.4 mg total) under the tongue every 5 (five) minutes as needed. For chest pain  25 tablet  6  . omeprazole (PRILOSEC) 20 MG capsule Take 20 mg by mouth daily.        . ramipril (ALTACE) 2.5 MG capsule Take 2.5 mg by mouth every morning.        Marland Kitchen spironolactone (ALDACTONE) 25 MG tablet Take 0.5 tablets (12.5 mg total) by mouth daily.  45 tablet  3     Past Medical History  Diagnosis Date  . 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  . Cardiac defibrillator in situ     s/p MDT Maximo VR  single lead ICD - 07/06/2006  . Hyperlipidemia   . Hypertension   . Ischemic cardiomyopathy     EF 40% 12/2008 V gram;  Echocardiogram 11/08/11: EF 30-35%, inferoseptal and anteroseptal, apical septal/lateral/anterior/inferior and true apical akinesis, no LV thrombus, grade 1 diastolic dysfunction.  Marland Kitchen History of orthostatic hypotension   . Degenerative joint disease   . Hypothyroidism   . Premature atrial contractions   . Premature ventricular contractions   . Systolic CHF, chronic   . Lumbar spondylosis     s/p L5-S1 fusion 12/11/2009  . Skin cancer     skin  . H/O hiatal hernia     times 3  . Spondylosis     ROS:   All systems reviewed and negative except as noted in the HPI.   Past Surgical History  Procedure Date  . Icd     s/p MDT Maximo VR single lead ICD - 07/06/2006  . Lumbar fusion     s/p L5-S1 fusion 12/11/2009  . Back surgery      Family History  Problem Relation Age of Onset  . Pneumonia Mother     deceased 66  . Peripheral vascular disease Father  deceased 61     History   Social History  . Marital Status: Married    Spouse Name: [name]    Number of Children: [name]  . Years of Education: [name]   Occupational History  . retired [name]   Social History Main Topics  . Smoking status: Never Smoker   . Smokeless tobacco: Not on file  . Alcohol Use: No  . Drug Use: No  . Sexually Active: No   Other Topics Concern  . Not on file   Social History Narrative   Active, walks regularly.     BP 124/74  Pulse 64  Ht 5\' 8"  (1.727 m)  Wt 149 lb 6.4 oz (67.767 kg)  BMI 22.72 kg/m2  Physical Exam:  Well appearing 78 year old man, NAD HEENT: Unremarkable Neck:  No JVD, no thyromegally Lungs:  Clear with no wheezes, rales, or rhonchi. HEART:  Regular rate rhythm, no murmurs, no rubs, no clicks Abd:  soft, positive bowel sounds, no organomegally, no rebound, no guarding Ext:  2 plus pulses, no edema, no cyanosis, no clubbing Skin:  No  rashes no nodules Neuro:  CN II through XII intact, motor grossly intact  DEVICE  Normal device function.  See PaceArt for details.   Assess/Plan:

## 2012-06-21 NOTE — Patient Instructions (Addendum)
Your physician wants you to follow-up in: 3 months with Device clinic and 12 months with Dr Corey Jordan will receive a reminder letter in the mail two months in advance. If you don't receive a letter, please call our office to schedule the follow-up appointment.

## 2012-06-21 NOTE — Assessment & Plan Note (Signed)
His Medtronic device is working normally. We'll plan to recheck in several months.

## 2012-06-21 NOTE — Assessment & Plan Note (Signed)
He currently denies anginal symptoms. I've encouraged him to increase his physical activity.

## 2012-06-21 NOTE — Assessment & Plan Note (Signed)
His symptoms are currently well compensated. We discussed the importance of maintaining a low-sodium diet and continuing his current medical therapy. He is on a low dose of beta blocker and ACE inhibitor secondary to hypotension. No change in medications today.

## 2012-06-30 DIAGNOSIS — L821 Other seborrheic keratosis: Secondary | ICD-10-CM | POA: Diagnosis not present

## 2012-06-30 DIAGNOSIS — Z85828 Personal history of other malignant neoplasm of skin: Secondary | ICD-10-CM | POA: Diagnosis not present

## 2012-07-01 ENCOUNTER — Ambulatory Visit: Payer: Medicare Other | Admitting: Internal Medicine

## 2012-08-25 ENCOUNTER — Other Ambulatory Visit: Payer: Self-pay | Admitting: Cardiology

## 2012-08-26 ENCOUNTER — Other Ambulatory Visit: Payer: Self-pay | Admitting: Cardiology

## 2012-08-26 MED ORDER — CARVEDILOL 6.25 MG PO TABS: 6.2500 mg | ORAL_TABLET | Freq: Two times a day (BID) | ORAL | Status: DC

## 2012-08-26 MED ORDER — RAMIPRIL 2.5 MG PO CAPS: 2.5000 mg | ORAL_CAPSULE | ORAL | Status: DC

## 2012-09-14 ENCOUNTER — Encounter: Payer: Self-pay | Admitting: *Deleted

## 2012-09-14 DIAGNOSIS — Z95810 Presence of automatic (implantable) cardiac defibrillator: Secondary | ICD-10-CM | POA: Insufficient documentation

## 2012-09-21 ENCOUNTER — Encounter: Payer: Self-pay | Admitting: Internal Medicine

## 2012-09-21 ENCOUNTER — Ambulatory Visit (INDEPENDENT_AMBULATORY_CARE_PROVIDER_SITE_OTHER): Payer: Medicare Other | Admitting: *Deleted

## 2012-09-21 DIAGNOSIS — I2589 Other forms of chronic ischemic heart disease: Secondary | ICD-10-CM

## 2012-09-21 DIAGNOSIS — I255 Ischemic cardiomyopathy: Secondary | ICD-10-CM

## 2012-09-21 LAB — ICD DEVICE OBSERVATION
CHARGE TIME: 8.75 s
DEV-0020ICD: NEGATIVE
FVT: 0
RV LEAD AMPLITUDE: 17.8 mv
RV LEAD IMPEDENCE ICD: 648 Ohm
RV LEAD THRESHOLD: 1 V
TZAT-0001SLOWVT: 1
TZAT-0001SLOWVT: 2
TZAT-0004SLOWVT: 8
TZAT-0004SLOWVT: 8
TZAT-0005FASTVT: 88 pct
TZAT-0005SLOWVT: 91 pct
TZAT-0011FASTVT: 10 ms
TZAT-0012FASTVT: 200 ms
TZAT-0012SLOWVT: 200 ms
TZAT-0012SLOWVT: 200 ms
TZAT-0013FASTVT: 1
TZAT-0013SLOWVT: 2
TZAT-0013SLOWVT: 2
TZAT-0018FASTVT: NEGATIVE
TZAT-0018SLOWVT: NEGATIVE
TZAT-0018SLOWVT: NEGATIVE
TZAT-0019FASTVT: 8 V
TZAT-0020SLOWVT: 1.6 ms
TZON-0003SLOWVT: 350 ms
TZON-0004SLOWVT: 32
TZON-0005SLOWVT: 32
TZON-0008FASTVT: 0 ms
TZON-0011AFLUTTER: 70
TZST-0001FASTVT: 3
TZST-0001FASTVT: 5
TZST-0001FASTVT: 6
TZST-0001SLOWVT: 6
TZST-0003FASTVT: 35 J
TZST-0003FASTVT: 35 J
TZST-0003SLOWVT: 25 J
TZST-0003SLOWVT: 35 J
VENTRICULAR PACING ICD: 0 pct

## 2012-09-21 NOTE — Patient Instructions (Addendum)
Return office visit 12/22/12 @ 11:30am.

## 2012-09-21 NOTE — Progress Notes (Signed)
ICD check 

## 2012-09-26 ENCOUNTER — Other Ambulatory Visit: Payer: Medicare Other | Admitting: Internal Medicine

## 2012-09-26 DIAGNOSIS — Z79899 Other long term (current) drug therapy: Secondary | ICD-10-CM

## 2012-09-26 DIAGNOSIS — E785 Hyperlipidemia, unspecified: Secondary | ICD-10-CM

## 2012-09-26 DIAGNOSIS — E039 Hypothyroidism, unspecified: Secondary | ICD-10-CM

## 2012-09-26 LAB — TSH: TSH: 1.718 u[IU]/mL (ref 0.350–4.500)

## 2012-09-26 LAB — HEPATIC FUNCTION PANEL
ALT: 10 U/L (ref 0–53)
AST: 18 U/L (ref 0–37)
Albumin: 4.2 g/dL (ref 3.5–5.2)
Alkaline Phosphatase: 64 U/L (ref 39–117)
Indirect Bilirubin: 0.5 mg/dL (ref 0.0–0.9)
Total Protein: 6.3 g/dL (ref 6.0–8.3)

## 2012-09-26 LAB — LIPID PANEL
Cholesterol: 140 mg/dL (ref 0–200)
LDL Cholesterol: 68 mg/dL (ref 0–99)
Triglycerides: 52 mg/dL (ref <150)

## 2012-09-27 ENCOUNTER — Ambulatory Visit (INDEPENDENT_AMBULATORY_CARE_PROVIDER_SITE_OTHER): Payer: Medicare Other | Admitting: Internal Medicine

## 2012-09-27 ENCOUNTER — Encounter: Payer: Self-pay | Admitting: Internal Medicine

## 2012-09-27 VITALS — BP 142/78 | HR 64 | Temp 98.2°F | Wt 154.0 lb

## 2012-09-27 DIAGNOSIS — Z23 Encounter for immunization: Secondary | ICD-10-CM

## 2012-10-21 NOTE — Progress Notes (Signed)
  Subjective:    Patient ID: Corey Jordan, male    DOB: [birth year], 78 y.o.   MRN: [id number]  HPI 78 year old white male with coronary artery disease and ischemic cardiomyopathy in today for followup of hypertension and hyperlipidemia. He has an implantable cardiac defibrillator. History of hypothyroidism. History of lumbar disc disease and lumbar spondylosis. History of PACs and PVCs. Multiple admissions for coronary artery disease and chest pain. Had an MI in 2004 with total occlusion of the LAD which was stented twice. Right coronary artery had 40% proximal lesion then. Circumflex was normal. Patient had 80% diagonal lesion then. At that time had ejection fraction of 30-35%.  Currently feeling well with no new complaints. He is status post surgery for herniated disc L5-S1 with fusion by Dr. Joylene Jordan January 2011.  Left inguinal hernia repair by Dr. Maryagnes Jordan in the summer of 2000. Followed by Dr. Juanito Jordan and Dr. Sharrell Jordan.    Review of Systems     Objective:   Physical Exam Neck is supple without JVD thyromegaly or carotid bruits. Chest clear to auscultation without rales or wheezing. Cardiac exam regular rate and rhythm normal S1 and S2. Abdomen no hepatosplenomegaly masses or tenderness. Extremities without edema. Skin is warm and dry. He is alert and oriented x3 in his affect is normal.        Assessment & Plan:  Hyperlipidemia  Hypertension  Coronary artery disease  Implantable cardiac defibrillator  Lumbar disc disease  Ischemic cardiomyopathy  History of PACs and PVCs  Hypothyroidism  Plan: Patient will return in 6 months for health maintenance exam. Influenza immunization given today.

## 2012-10-21 NOTE — Patient Instructions (Addendum)
Continue same medications and return in 6 months 

## 2012-11-30 ENCOUNTER — Other Ambulatory Visit: Payer: Self-pay | Admitting: *Deleted

## 2012-11-30 MED ORDER — RAMIPRIL 2.5 MG PO CAPS: 2.5000 mg | ORAL_CAPSULE | ORAL | Status: DC

## 2012-12-12 DIAGNOSIS — H6120 Impacted cerumen, unspecified ear: Secondary | ICD-10-CM | POA: Diagnosis not present

## 2012-12-14 ENCOUNTER — Ambulatory Visit: Payer: Medicare Other | Admitting: Cardiology

## 2012-12-21 ENCOUNTER — Ambulatory Visit: Payer: Medicare Other | Admitting: Cardiology

## 2012-12-26 ENCOUNTER — Encounter: Payer: Self-pay | Admitting: Internal Medicine

## 2012-12-26 ENCOUNTER — Ambulatory Visit (INDEPENDENT_AMBULATORY_CARE_PROVIDER_SITE_OTHER): Payer: Medicare Other | Admitting: *Deleted

## 2012-12-26 DIAGNOSIS — Z95810 Presence of automatic (implantable) cardiac defibrillator: Secondary | ICD-10-CM | POA: Diagnosis not present

## 2012-12-26 DIAGNOSIS — I4949 Other premature depolarization: Secondary | ICD-10-CM | POA: Diagnosis not present

## 2012-12-26 DIAGNOSIS — I493 Ventricular premature depolarization: Secondary | ICD-10-CM

## 2012-12-26 LAB — ICD DEVICE OBSERVATION
BATTERY VOLTAGE: 2.97 V
DEV-0020ICD: NEGATIVE
FVT: 0
PACEART VT: 0
RV LEAD THRESHOLD: 1 V
TZAT-0004FASTVT: 8
TZAT-0005SLOWVT: 88 pct
TZAT-0005SLOWVT: 91 pct
TZAT-0011FASTVT: 10 ms
TZAT-0011SLOWVT: 10 ms
TZAT-0011SLOWVT: 10 ms
TZAT-0012FASTVT: 200 ms
TZAT-0012SLOWVT: 200 ms
TZAT-0012SLOWVT: 200 ms
TZAT-0013FASTVT: 1
TZAT-0018SLOWVT: NEGATIVE
TZAT-0018SLOWVT: NEGATIVE
TZAT-0020FASTVT: 1.6 ms
TZON-0003FASTVT: 250 ms
TZON-0003SLOWVT: 350 ms
TZON-0005SLOWVT: 32
TZON-0008SLOWVT: 0 ms
TZON-0011AFLUTTER: 70
TZST-0001FASTVT: 3
TZST-0001FASTVT: 4
TZST-0001FASTVT: 5
TZST-0001SLOWVT: 4
TZST-0001SLOWVT: 6
TZST-0003FASTVT: 25 J
TZST-0003FASTVT: 35 J
TZST-0003FASTVT: 35 J
TZST-0003SLOWVT: 15 J
TZST-0003SLOWVT: 35 J
TZST-0003SLOWVT: 35 J
VENTRICULAR PACING ICD: 0 pct

## 2012-12-26 NOTE — Progress Notes (Signed)
icd check in clinic  

## 2013-01-02 DIAGNOSIS — D044 Carcinoma in situ of skin of scalp and neck: Secondary | ICD-10-CM | POA: Diagnosis not present

## 2013-01-02 DIAGNOSIS — D239 Other benign neoplasm of skin, unspecified: Secondary | ICD-10-CM | POA: Diagnosis not present

## 2013-01-02 DIAGNOSIS — D485 Neoplasm of uncertain behavior of skin: Secondary | ICD-10-CM | POA: Diagnosis not present

## 2013-01-02 DIAGNOSIS — Z85828 Personal history of other malignant neoplasm of skin: Secondary | ICD-10-CM | POA: Diagnosis not present

## 2013-01-02 DIAGNOSIS — L570 Actinic keratosis: Secondary | ICD-10-CM | POA: Diagnosis not present

## 2013-01-30 ENCOUNTER — Other Ambulatory Visit: Payer: Self-pay | Admitting: *Deleted

## 2013-01-30 MED ORDER — CARVEDILOL 6.25 MG PO TABS: 6.2500 mg | ORAL_TABLET | Freq: Two times a day (BID) | ORAL | Status: DC

## 2013-02-28 ENCOUNTER — Encounter: Payer: Self-pay | Admitting: Cardiology

## 2013-02-28 ENCOUNTER — Ambulatory Visit (INDEPENDENT_AMBULATORY_CARE_PROVIDER_SITE_OTHER): Payer: Medicare Other | Admitting: Cardiology

## 2013-02-28 VITALS — BP 144/74 | HR 53 | Ht 68.0 in | Wt 153.0 lb

## 2013-02-28 DIAGNOSIS — Z95810 Presence of automatic (implantable) cardiac defibrillator: Secondary | ICD-10-CM

## 2013-02-28 DIAGNOSIS — I2510 Atherosclerotic heart disease of native coronary artery without angina pectoris: Secondary | ICD-10-CM | POA: Diagnosis not present

## 2013-02-28 DIAGNOSIS — I509 Heart failure, unspecified: Secondary | ICD-10-CM

## 2013-02-28 DIAGNOSIS — I10 Essential (primary) hypertension: Secondary | ICD-10-CM

## 2013-02-28 DIAGNOSIS — E785 Hyperlipidemia, unspecified: Secondary | ICD-10-CM

## 2013-02-28 DIAGNOSIS — I5022 Chronic systolic (congestive) heart failure: Secondary | ICD-10-CM

## 2013-02-28 MED ORDER — SPIRONOLACTONE 25 MG PO TABS: 12.5000 mg | ORAL_TABLET | Freq: Every day | ORAL | Status: DC

## 2013-02-28 MED ORDER — NITROGLYCERIN 0.4 MG SL SUBL: 0.4000 mg | SUBLINGUAL_TABLET | SUBLINGUAL | Status: DC | PRN

## 2013-02-28 MED ORDER — ATORVASTATIN CALCIUM 40 MG PO TABS: 40.0000 mg | ORAL_TABLET | Freq: Every day | ORAL | Status: DC

## 2013-02-28 NOTE — Assessment & Plan Note (Signed)
This is been under excellent control. Blood pressure Journal from home reviewed.

## 2013-02-28 NOTE — Assessment & Plan Note (Addendum)
Remarkably stable. No change in medical therapy. Last ejection fraction was around 45%. This is improved.

## 2013-02-28 NOTE — Assessment & Plan Note (Signed)
At this point he feels a Vytorin is too expensive. We'll change to atorvastatin 40 mg each bedtime with repeat labs in 6-8 weeks.

## 2013-02-28 NOTE — Progress Notes (Signed)
HPI Corey Jordan returns today for his history of coronary artery disease, status post large anterior Corey Jordan infarct about 10 years ago, status post PCI of the LAD at the same time, nonobstructive disease otherwise except for 75% in a first diagonal not felt amenable to PCI. He also has a defibrillator present which is never fired. His last ejection fraction was about 45% on cath with patent stents  to the LAD.  She denies any orthopnea, PND or edema. His atypical chest pain is infrequent but he takes nitroglycerin which seems to help.  He says Vytorin is too expensive. He wants me to switch to a generic.  Past Medical History  Diagnosis Date  . 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  . Cardiac defibrillator in situ     s/p MDT Maximo VR single lead ICD - 07/06/2006  . Hyperlipidemia   . Hypertension   . Ischemic cardiomyopathy     EF 40% 12/2008 V gram;  Echocardiogram 11/08/11: EF 30-35%, inferoseptal and anteroseptal, apical septal/lateral/anterior/inferior and true apical akinesis, no LV thrombus, grade 1 diastolic dysfunction.  Marland Kitchen History of orthostatic hypotension   . Degenerative joint disease   . Hypothyroidism   . Premature atrial contractions   . Premature ventricular contractions   . Systolic CHF, chronic   . Lumbar spondylosis     s/p L5-S1 fusion 12/11/2009  . Skin cancer     skin  . H/O hiatal hernia     times 3  . Spondylosis     Current Outpatient Prescriptions  Medication Sig Dispense Refill  . aspirin 81 MG EC tablet Take 81 mg by mouth daily.        . calcium carbonate (OS-CAL) 600 MG TABS Take 600 mg by mouth 2 (two) times daily with a meal.        . carvedilol (COREG) 6.25 MG  tablet Take 1 tablet (6.25 mg total) by mouth 2 (two) times daily with a meal.  180 tablet  1  . cholecalciferol (VITAMIN D) 1000 UNITS tablet Take 2,000 Units by mouth daily.        . clopidogrel (PLAVIX) 75 MG tablet Take 1 tablet (75 mg total) by mouth daily.  90 tablet  3  . ezetimibe-simvastatin (VYTORIN) 10-40 MG per tablet Take 1 tablet by mouth at bedtime.  90 tablet  3  . levothyroxine (SYNTHROID, LEVOTHROID) 50 MCG tablet Take 50 mcg by mouth every morning.       . nitroGLYCERIN (NITROSTAT) 0.4 MG SL tablet Place 1 tablet (0.4 mg total) under the tongue every 5 (five) minutes as needed. For chest pain  25 tablet  6  . omeprazole (PRILOSEC) 20 MG capsule Take 20 mg by mouth daily.        . ramipril (ALTACE) 2.5 MG capsule Take 1 capsule (2.5 mg total) by mouth every morning.  90 capsule  1  . spironolactone (ALDACTONE) 25 MG tablet Take 0.5 tablets (12.5 mg total) by mouth daily.  45 tablet  3   No current facility-administered medications for this visit.    No Known Allergies  Family History  Problem Relation Age of Onset  . Pneumonia Mother  deceased 59  . Peripheral vascular disease Father     deceased 46    History   Social History  . Marital Status: Married    Spouse Name: [name]    Number of Children: [name]  . Years of Education: [name]   Occupational History  . retired [name]   Social History Main Topics  . Smoking status: Never Smoker   . Smokeless tobacco: Not on file  . Alcohol Use: No  . Drug Use: No  . Sexually Active: No   Other Topics Concern  . Not on file   Social History Narrative   Active, walks regularly.    ROS ALL NEGATIVE EXCEPT THOSE NOTED IN HPI  PE  General Appearance: well developed, well nourished in no acute distress, thin HEENT: symmetrical face, PERRLA, good dentition  Neck: no JVD, thyromegaly, or adenopathy, trachea midline Chest: symmetric without deformity, defibrillator generator intact and stable. Cardiac: PMI  non-displaced, RRR, normal S1, S2, no gallop or murmur Lung: clear to ausculation and percussion Vascular: all pulses full without bruits  Abdominal: nondistended, nontender, good bowel sounds, no HSM, no bruits Extremities: no cyanosis, clubbing or edema, no sign of DVT, superficial varicosities Skin: normal color, no rashes Neuro: alert and oriented x 3, non-focal Pysch: normal affect  EKG Sinus bradycardia, old septal Corey Jordan infarct with ST-T wave changes. Stable.  BMET    Component Value Date/Time   NA 140 03/24/2012 0922   K 4.4 03/24/2012 0922   CL 101 03/24/2012 0922   CO2 31 03/24/2012 0922   GLUCOSE 85 03/24/2012 0922   BUN 15 03/24/2012 0922   CREATININE 1.04 03/24/2012 0922   CREATININE 1.12 11/09/2011 0630   CALCIUM 9.8 03/24/2012 0922   GFRNONAA 62* 11/09/2011 0630   GFRAA 72* 11/09/2011 0630    Lipid Panel     Component Value Date/Time   CHOL 140 09/26/2012 0910   TRIG 52 09/26/2012 0910   HDL 62 09/26/2012 0910   CHOLHDL 2.3 09/26/2012 0910   VLDL 10 09/26/2012 0910   LDLCALC 68 09/26/2012 0910    CBC    Component Value Date/Time   WBC 8.4 03/24/2012 0922   RBC 4.78 03/24/2012 0922   HGB 14.2 03/24/2012 0922   HCT 45.5 03/24/2012 0922   PLT 181 03/24/2012 0922   MCV 95.2 03/24/2012 0922   MCH 29.7 03/24/2012 0922   MCHC 31.2 03/24/2012 0922   RDW 13.5 03/24/2012 0922   LYMPHSABS 0.7 03/24/2012 0922   MONOABS 1.0 03/24/2012 0922   EOSABS 0.1 03/24/2012 0922   BASOSABS 0.0 03/24/2012 1478

## 2013-02-28 NOTE — Patient Instructions (Addendum)
STOP Vytorin.  Start Atorvastatin 40mg  daily.  Your physician recommends that you return for fasting lab work in: 6-8 weeks.  Lipid Panel, Liver Function and CMET.  Your physician wants you to follow-up in: 1 year with Dr. Daleen Squibb. You will receive a reminder letter in the mail two months in advance. If you don't receive a letter, please call our office to schedule the follow-up appointment.

## 2013-02-28 NOTE — Assessment & Plan Note (Signed)
Stable. Continue secondary preventative therapy. 

## 2013-03-28 ENCOUNTER — Other Ambulatory Visit: Payer: Medicare Other | Admitting: Internal Medicine

## 2013-03-28 DIAGNOSIS — Z79899 Other long term (current) drug therapy: Secondary | ICD-10-CM

## 2013-03-28 DIAGNOSIS — E785 Hyperlipidemia, unspecified: Secondary | ICD-10-CM

## 2013-03-28 DIAGNOSIS — E039 Hypothyroidism, unspecified: Secondary | ICD-10-CM

## 2013-03-28 DIAGNOSIS — Z125 Encounter for screening for malignant neoplasm of prostate: Secondary | ICD-10-CM | POA: Diagnosis not present

## 2013-03-28 DIAGNOSIS — I10 Essential (primary) hypertension: Secondary | ICD-10-CM

## 2013-03-28 LAB — PSA: PSA: 3.21 ng/mL (ref <=4.00)

## 2013-03-28 LAB — LIPID PANEL
Cholesterol: 149 mg/dL (ref 0–200)
LDL Cholesterol: 71 mg/dL (ref 0–99)
VLDL: 11 mg/dL (ref 0–40)

## 2013-03-28 LAB — CBC WITH DIFFERENTIAL/PLATELET
Basophils Absolute: 0 10*3/uL (ref 0.0–0.1)
HCT: 40.4 % (ref 39.0–52.0)
Lymphocytes Relative: 20 % (ref 12–46)
Lymphs Abs: 0.9 10*3/uL (ref 0.7–4.0)
Monocytes Absolute: 0.6 10*3/uL (ref 0.1–1.0)
Neutro Abs: 3.1 10*3/uL (ref 1.7–7.7)
RBC: 4.41 MIL/uL (ref 4.22–5.81)
RDW: 13.3 % (ref 11.5–15.5)
WBC: 4.7 10*3/uL (ref 4.0–10.5)

## 2013-03-28 LAB — COMPREHENSIVE METABOLIC PANEL
ALT: 14 U/L (ref 0–53)
AST: 17 U/L (ref 0–37)
Albumin: 4.2 g/dL (ref 3.5–5.2)
CO2: 29 mEq/L (ref 19–32)
Calcium: 9.8 mg/dL (ref 8.4–10.5)
Chloride: 101 mEq/L (ref 96–112)
Potassium: 4.4 mEq/L (ref 3.5–5.3)

## 2013-03-29 ENCOUNTER — Other Ambulatory Visit: Payer: Self-pay | Admitting: Internal Medicine

## 2013-03-29 ENCOUNTER — Ambulatory Visit (INDEPENDENT_AMBULATORY_CARE_PROVIDER_SITE_OTHER): Payer: Medicare Other | Admitting: *Deleted

## 2013-03-29 DIAGNOSIS — I255 Ischemic cardiomyopathy: Secondary | ICD-10-CM

## 2013-03-29 DIAGNOSIS — I2589 Other forms of chronic ischemic heart disease: Secondary | ICD-10-CM

## 2013-03-29 LAB — ICD DEVICE OBSERVATION
BATTERY VOLTAGE: 2.93 V
DEV-0020ICD: NEGATIVE
FVT: 0
PACEART VT: 0
TZAT-0004FASTVT: 8
TZAT-0004SLOWVT: 8
TZAT-0004SLOWVT: 8
TZAT-0005SLOWVT: 88 pct
TZAT-0005SLOWVT: 91 pct
TZAT-0011FASTVT: 10 ms
TZAT-0011SLOWVT: 10 ms
TZAT-0011SLOWVT: 10 ms
TZAT-0012FASTVT: 200 ms
TZAT-0018SLOWVT: NEGATIVE
TZON-0003FASTVT: 250 ms
TZON-0005SLOWVT: 32
TZST-0001FASTVT: 3
TZST-0001FASTVT: 4
TZST-0001SLOWVT: 3
TZST-0001SLOWVT: 4
TZST-0001SLOWVT: 6
TZST-0003FASTVT: 25 J
TZST-0003FASTVT: 35 J
TZST-0003SLOWVT: 15 J
TZST-0003SLOWVT: 35 J

## 2013-03-29 NOTE — Progress Notes (Signed)
icd check in clinic. Normal device function.

## 2013-03-30 ENCOUNTER — Encounter: Payer: Self-pay | Admitting: Internal Medicine

## 2013-03-30 ENCOUNTER — Ambulatory Visit (INDEPENDENT_AMBULATORY_CARE_PROVIDER_SITE_OTHER): Payer: Medicare Other | Admitting: Internal Medicine

## 2013-03-30 VITALS — BP 144/76 | HR 56 | Temp 97.4°F | Ht 67.0 in | Wt 151.5 lb

## 2013-03-30 DIAGNOSIS — Z95810 Presence of automatic (implantable) cardiac defibrillator: Secondary | ICD-10-CM

## 2013-03-30 DIAGNOSIS — K219 Gastro-esophageal reflux disease without esophagitis: Secondary | ICD-10-CM

## 2013-03-30 DIAGNOSIS — M519 Unspecified thoracic, thoracolumbar and lumbosacral intervertebral disc disorder: Secondary | ICD-10-CM

## 2013-03-30 DIAGNOSIS — Z8679 Personal history of other diseases of the circulatory system: Secondary | ICD-10-CM

## 2013-03-30 DIAGNOSIS — I259 Chronic ischemic heart disease, unspecified: Secondary | ICD-10-CM

## 2013-03-30 DIAGNOSIS — E039 Hypothyroidism, unspecified: Secondary | ICD-10-CM | POA: Diagnosis not present

## 2013-03-30 DIAGNOSIS — Z0000 Encounter for general adult medical examination without abnormal findings: Secondary | ICD-10-CM

## 2013-03-30 MED ORDER — OMEPRAZOLE 20 MG PO CPDR: 20.0000 mg | DELAYED_RELEASE_CAPSULE | Freq: Every day | ORAL | Status: DC

## 2013-03-30 MED ORDER — LEVOTHYROXINE SODIUM 50 MCG PO TABS: 50.0000 ug | ORAL_TABLET | ORAL | Status: DC

## 2013-04-28 ENCOUNTER — Other Ambulatory Visit: Payer: Self-pay | Admitting: Cardiology

## 2013-05-02 NOTE — Telephone Encounter (Signed)
..   Requested Prescriptions   Pending Prescriptions Disp Refills  . ramipril (ALTACE) 2.5 MG capsule [Pharmacy Med Name: RAMIPRIL CAP 2.5MG ] 90 capsule 3    Sig: TAKE 1 CAPSULE EVERY       MORNING

## 2013-05-10 ENCOUNTER — Encounter: Payer: Self-pay | Admitting: Internal Medicine

## 2013-05-25 ENCOUNTER — Other Ambulatory Visit: Payer: Self-pay | Admitting: Cardiology

## 2013-05-31 DIAGNOSIS — H2510 Age-related nuclear cataract, unspecified eye: Secondary | ICD-10-CM | POA: Diagnosis not present

## 2013-06-06 DIAGNOSIS — H6120 Impacted cerumen, unspecified ear: Secondary | ICD-10-CM | POA: Diagnosis not present

## 2013-06-29 ENCOUNTER — Encounter: Payer: Self-pay | Admitting: Internal Medicine

## 2013-06-29 ENCOUNTER — Ambulatory Visit (INDEPENDENT_AMBULATORY_CARE_PROVIDER_SITE_OTHER): Payer: Medicare Other | Admitting: Internal Medicine

## 2013-06-29 VITALS — BP 154/78 | HR 59 | Ht 67.0 in | Wt 152.8 lb

## 2013-06-29 DIAGNOSIS — I255 Ischemic cardiomyopathy: Secondary | ICD-10-CM

## 2013-06-29 DIAGNOSIS — Z95810 Presence of automatic (implantable) cardiac defibrillator: Secondary | ICD-10-CM | POA: Diagnosis not present

## 2013-06-29 DIAGNOSIS — I2589 Other forms of chronic ischemic heart disease: Secondary | ICD-10-CM

## 2013-06-29 DIAGNOSIS — I5022 Chronic systolic (congestive) heart failure: Secondary | ICD-10-CM

## 2013-06-29 DIAGNOSIS — I509 Heart failure, unspecified: Secondary | ICD-10-CM

## 2013-06-29 DIAGNOSIS — I2510 Atherosclerotic heart disease of native coronary artery without angina pectoris: Secondary | ICD-10-CM | POA: Diagnosis not present

## 2013-06-29 LAB — ICD DEVICE OBSERVATION
BATTERY VOLTAGE: 2.89 V
BRDY-0002RV: 40 {beats}/min
CHARGE TIME: 8.99 s
RV LEAD AMPLITUDE: 17.6 mv
RV LEAD IMPEDENCE ICD: 648 Ohm
TZAT-0001FASTVT: 1
TZAT-0001SLOWVT: 1
TZAT-0012SLOWVT: 200 ms
TZAT-0012SLOWVT: 200 ms
TZAT-0013FASTVT: 1
TZAT-0013SLOWVT: 2
TZAT-0018FASTVT: NEGATIVE
TZAT-0019SLOWVT: 8 V
TZAT-0019SLOWVT: 8 V
TZAT-0020FASTVT: 1.6 ms
TZAT-0020SLOWVT: 1.6 ms
TZAT-0020SLOWVT: 1.6 ms
TZON-0003SLOWVT: 350 ms
TZON-0008SLOWVT: 0 ms
TZST-0001FASTVT: 2
TZST-0001FASTVT: 4
TZST-0001FASTVT: 5
TZST-0001FASTVT: 6
TZST-0001SLOWVT: 5
TZST-0003FASTVT: 35 J
TZST-0003FASTVT: 35 J
TZST-0003SLOWVT: 15 J
TZST-0003SLOWVT: 25 J
TZST-0003SLOWVT: 35 J
VENTRICULAR PACING ICD: 0 pct
VF: 0

## 2013-06-29 MED ORDER — FUROSEMIDE 20 MG PO TABS: 20.0000 mg | ORAL_TABLET | Freq: Every day | ORAL | Status: DC

## 2013-06-29 NOTE — Progress Notes (Signed)
HPI [name] returns today for followup. He is a very pleasant 79 year old man with an ischemic cardiomyopathy, chronic systolic heart failure, status post ICD implantation. He is active, walking 5 days a week for approximately 25 minutes at a time. Overall he feels well but does note occasional episodes of dyspnea. No syncope, and no chest pain. He denies peripheral edema. He has had no recent ICD shocks. He denies dietary or medical indiscretion or noncompliance. No Known Allergies   Current Outpatient Prescriptions  Medication Sig Dispense Refill  . aspirin 81 MG EC tablet Take 81 mg by mouth daily.        Marland Kitchen atorvastatin (LIPITOR) 40 MG tablet Take 1 tablet (40 mg total) by mouth daily.  90 tablet  3  . calcium carbonate (OS-CAL) 600 MG TABS Take 600 mg by mouth 2 (two) times daily with a meal.        . carvedilol (COREG) 6.25 MG tablet Take 1 tablet (6.25 mg total) by mouth 2 (two) times daily with a meal.  180 tablet  1  . cholecalciferol (VITAMIN D) 1000 UNITS tablet Take 2,000 Units by mouth daily.        . clopidogrel (PLAVIX) 75 MG tablet TAKE 1 TABLET DAILY  90 tablet  3  . levothyroxine (SYNTHROID, LEVOTHROID) 50 MCG tablet Take 1 tablet (50 mcg total) by mouth every morning.  90 tablet  3  . nitroGLYCERIN (NITROSTAT) 0.4 MG SL tablet Place 1 tablet (0.4 mg total) under the tongue every 5 (five) minutes as needed. For chest pain  25 tablet  6  . omeprazole (PRILOSEC) 20 MG capsule Take 1 capsule (20 mg total) by mouth daily.  90 capsule  3  . ramipril (ALTACE) 2.5 MG capsule TAKE 1 CAPSULE EVERY       MORNING  90 capsule  3  . spironolactone (ALDACTONE) 25 MG tablet Take 0.5 tablets (12.5 mg total) by mouth daily.  45 tablet  3   No current facility-administered medications for this visit.     Past Medical History  Diagnosis Date  . 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  . Cardiac defibrillator in situ     s/p MDT Maximo VR single lead ICD - 07/06/2006  . Hyperlipidemia   . Hypertension   . Ischemic cardiomyopathy     EF 40% 12/2008 V gram;  Echocardiogram 11/08/11: EF 30-35%, inferoseptal and anteroseptal, apical septal/lateral/anterior/inferior and true apical akinesis, no LV thrombus, grade 1 diastolic dysfunction.  Marland Kitchen History of orthostatic hypotension   . Degenerative joint disease   . Hypothyroidism   . Premature atrial contractions   . Premature ventricular contractions   . Systolic CHF, chronic   . Lumbar spondylosis     s/p L5-S1 fusion 12/11/2009  . Skin cancer     skin  . H/O hiatal hernia     times 3  . Spondylosis     ROS:   All systems reviewed and negative except as noted in the HPI.   Past Surgical History  Procedure Laterality Date  . Icd      s/p MDT Maximo VR single lead ICD - 07/06/2006  . Lumbar fusion  s/p L5-S1 fusion 12/11/2009  . Back surgery       Family History  Problem Relation Age of Onset  . Pneumonia Mother     deceased 21  . Peripheral vascular disease Father     deceased 60     History   Social History  . Marital Status: Married    Spouse Name: [name]    Number of Children: [name]  . Years of Education: [name]   Occupational History  . retired [name]   Social History Main Topics  . Smoking status: Never Smoker   . Smokeless tobacco: Not on file  . Alcohol Use: No  . Drug Use: No  . Sexually Active: No   Other Topics Concern  . Not on file   Social History Narrative   Active, walks regularly.     BP 154/78  Pulse 59  Ht 5\' 7"  (1.702 m)  Wt 152 lb 12.8 oz (69.31 kg)  BMI 23.93 kg/m2  Physical Exam:  Well appearing 78 year old man, NAD HEENT: Unremarkable Neck:  7 cm JVD, no  thyromegally Back:  No CVA tenderness Lungs:  Clear with no wheezes, rales, or rhonchi. HEART:  Regular rate rhythm, no murmurs, no rubs, no clicks Abd:  soft, positive bowel sounds, no organomegally, no rebound, no guarding Ext:  2 plus pulses, no edema, no cyanosis, no clubbing Skin:  No rashes no nodules Neuro:  CN II through XII intact, motor grossly intact   DEVICE  Normal device function.  See PaceArt for details.   Assess/Plan:

## 2013-06-29 NOTE — Assessment & Plan Note (Signed)
He denies anginal symptoms. He will continue his current medical therapy. 

## 2013-06-29 NOTE — Assessment & Plan Note (Signed)
His Medtronic ICD is working normally. We'll plan to recheck in several months. 

## 2013-06-29 NOTE — Assessment & Plan Note (Signed)
His chronic systolic heart failure symptoms are class II. He has intermittent dyspnea with exertion. I've given the patient a prescription of Lasix today and instructed him to take on days that he is more short of breath or his weight is up. He weighs himself daily, and typically is around 148-153 pounds. If his weight goes over 154 pounds, I've instructed him to take a Lasix tablet. I've encouraged the patient to reduce his salt intake.

## 2013-06-29 NOTE — Assessment & Plan Note (Signed)
His Medtronic single-chamber ICD is working normally. We'll plan to recheck in several months. 

## 2013-06-29 NOTE — Patient Instructions (Addendum)
Your physician wants you to follow-up in: 12 months with Dr Court Joy will receive a reminder letter in the mail two months in advance. If you don't receive a letter, please call our office to schedule the follow-up appointment.  Your physician recommends that you schedule a follow-up appointment in: 10/02/13 at 2:30pm  Your physician has recommended you make the following change in your medication:  1) Take Furosemide 20mg  as needed for Shortness of breath  And weight gain of over 154

## 2013-07-03 DIAGNOSIS — D1739 Benign lipomatous neoplasm of skin and subcutaneous tissue of other sites: Secondary | ICD-10-CM | POA: Diagnosis not present

## 2013-07-03 DIAGNOSIS — D1801 Hemangioma of skin and subcutaneous tissue: Secondary | ICD-10-CM | POA: Diagnosis not present

## 2013-07-03 DIAGNOSIS — Z85828 Personal history of other malignant neoplasm of skin: Secondary | ICD-10-CM | POA: Diagnosis not present

## 2013-07-03 DIAGNOSIS — D485 Neoplasm of uncertain behavior of skin: Secondary | ICD-10-CM | POA: Diagnosis not present

## 2013-07-03 DIAGNOSIS — L570 Actinic keratosis: Secondary | ICD-10-CM | POA: Diagnosis not present

## 2013-07-03 DIAGNOSIS — L408 Other psoriasis: Secondary | ICD-10-CM | POA: Diagnosis not present

## 2013-07-31 ENCOUNTER — Other Ambulatory Visit: Payer: Self-pay | Admitting: Cardiology

## 2013-08-26 ENCOUNTER — Encounter: Payer: Self-pay | Admitting: Internal Medicine

## 2013-08-26 NOTE — Progress Notes (Signed)
Subjective:    Patient ID: Corey Jordan, male    DOB: 1934-10-18, 79 y.o.   MRN: [id number]  HPI  and in patient has history of GE reflux, hypothyroidism detected in June 2008 now on thyroid replacement therapy, history of coronary artery disease, history of ischemic cardiomyopathy with ejection fraction of 30% on catheterization August 2007. History of class II congestive heart failure. History of dyslipidemia. Patient had implantation of Medtronic Maximo VR cardioverter defibrillator August 2007. He's followed by Dr. Sharrell Ku.  Patient had myocardial infarction in 2004 with total occlusion of the LAD which was stented x 2.. Right coronary artery had 40% proximal lesion.circumflex was normal. Patient had 80% diagonal lesion. Was noted at that time to have ejection fraction between 30 and 35%.  History of spondylosis and herniated disc with left radiculopathy L5 and S1 status post L5-S1 fusion by Dr. Channing Mutters January 2011.  Zostavax vaccine April 2011, Pneumovax in January 2010, tetanus immunization April 2011.  Had colonoscopy by Dr. Arlyce Dice October 2005.  History of left inguinal hernia repair by Dr. Maryagnes Amos  in the summer of 2000  Family history: Mother died of pneumonia at age 1. Father died of heart failure at age 61. One brother died at age 39 of cancer. Father had a history of venous thrombosis and apparently had a leg amputation.  Social history: He is retired from the post office. He is married with 2 adult children. Does not smoke or consume alcohol.     Review of Systems  Constitutional: Positive for fatigue.  HENT: Negative.   Eyes: Negative.   Respiratory: Negative.   Cardiovascular:       History of coronary artery disease  Endocrine:       History of hypothyroidism  Neurological: Negative.   Hematological: Negative.   Psychiatric/Behavioral: Negative.        Objective:   Physical Exam  Vitals reviewed. Constitutional: He is oriented to person, place, and time. He  appears well-developed and well-nourished. No distress.  HENT:  Head: Normocephalic and atraumatic.  Right Ear: External ear normal.  Left Ear: External ear normal.  Mouth/Throat: No oropharyngeal exudate.  Eyes: Conjunctivae and EOM are normal. Right eye exhibits no discharge. Left eye exhibits no discharge. No scleral icterus.  Neck: Neck supple. No JVD present. No thyromegaly present.  Cardiovascular: Normal rate, regular rhythm, normal heart sounds and intact distal pulses.   No murmur heard. Pulmonary/Chest: Effort normal and breath sounds normal. No respiratory distress. He has no wheezes. He has no rales.  Abdominal: Soft. Bowel sounds are normal. He exhibits no distension and no mass. There is no rebound and no guarding.  Genitourinary: Prostate normal.  Musculoskeletal: He exhibits no edema.  Lymphadenopathy:    He has no cervical adenopathy.  Neurological: He is alert and oriented to person, place, and time. He has normal reflexes. No cranial nerve deficit. Coordination normal.  Skin: Skin is warm and dry. No rash noted. He is not diaphoretic.  Psychiatric: He has a normal mood and affect. His behavior is normal. Judgment and thought content normal.          Assessment & Plan:   Coronary artery disease  Ischemic cardiomyopathy  History of implantable defibrillator  Hypothyroidism  Spondylosis with history of herniated disc status post L5-S1 fusion  History of GE reflux  Plan: Continue same medications and return in 6 months.   Subjective:   Patient presents for Medicare Annual/Subsequent preventive examination.   Review Past Medical/Family/Social: See  above   Risk Factors  Current exercise habits:  Dietary issues discussed:   Cardiac risk factors: Family history  Depression Screen  (Note: if answer to either of the following is "Yes", a more complete depression screening is indicated)   Over the past two weeks, have you felt down, depressed or  hopeless? No  Over the past two weeks, have you felt little interest or pleasure in doing things? No Have you lost interest or pleasure in daily life? No Do you often feel hopeless? No Do you cry easily over simple problems? No   Activities of Daily Living  In your present state of health, do you have any difficulty performing the following activities?:   Driving? No  Managing money? No  Feeding yourself? No  Getting from bed to chair? No  Climbing a flight of stairs? No  Preparing food and eating?: No  Bathing or showering? No  Getting dressed: No  Getting to the toilet? No  Using the toilet:No  Moving around from place to place: No  In the past year have you fallen or had a near fall?:No  Are you sexually active? No  Do you have more than one partner? No   Hearing Difficulties: No  Do you often ask people to speak up or repeat themselves? No  Do you experience ringing or noises in your ears? No  Do you have difficulty understanding soft or whispered voices? No  Do you feel that you have a problem with memory? No Do you often misplace items? No    Home Safety:  Do you have a smoke alarm at your residence? Yes Do you have grab bars in the bathroom? No Do you have throw rugs in your house? No   Cognitive Testing  Alert? Yes Normal Appearance?Yes  Oriented to person? Yes Place? Yes  Time? Yes  Recall of three objects? Yes  Can perform simple calculations? Yes  Displays appropriate judgment?Yes  Can read the correct time from a watch face?Yes   List the Names of Other Physician/Practitioners you currently use:  See referral list for the physicians patient is currently seeing. Dr. Sharrell Ku, Dr. Barnet Pall, Dr. Trey Sailors    Review of Systems: See above   Objective:     General appearance: Appears stated age and mildly obese  Head: Normocephalic, without obvious abnormality, atraumatic  Eyes: conj clear, EOMi PEERLA  Ears: normal TM's and external ear canals  both ears  Nose: Nares normal. Septum midline. Mucosa normal. No drainage or sinus tenderness.  Throat: lips, mucosa, and tongue normal; teeth and gums normal  Neck: no adenopathy, no carotid bruit, no JVD, supple, symmetrical, trachea midline and thyroid not enlarged, symmetric, no tenderness/mass/nodules  No CVA tenderness.  Lungs: clear to auscultation bilaterally  Breasts: normal appearance, no masses or tenderness. Heart: regular rate and rhythm, S1, S2 normal, no murmur, click, rub or gallop  Abdomen: soft, non-tender; bowel sounds normal; no masses, no organomegaly  Musculoskeletal: ROM normal in all joints, no crepitus, no deformity, Normal muscle strengthen. Back  is symmetric, no curvature. Skin: Skin color, texture, turgor normal. No rashes or lesions  Lymph nodes: Cervical, supraclavicular, and axillary nodes normal.  Neurologic: CN 2 -12 Normal, Normal symmetric reflexes. Normal coordination and gait  Psych: Alert & Oriented x 3, Mood appear stable.    Assessment:    Annual wellness medicare exam   Plan:    During the course of the visit the patient was educated and counseled  about appropriate screening and preventive services including:  Colonoscopy as recommended by Dr. Arlyce Dice   Annual PSA  Annual flu vaccine      Patient Instructions (the written plan) was given to the patient.  Medicare Attestation  I have personally reviewed:  The patient's medical and social history  Their use of alcohol, tobacco or illicit drugs  Their current medications and supplements  The patient's functional ability including ADLs,fall risks, home safety risks, cognitive, and hearing and visual impairment  Diet and physical activities  Evidence for depression or mood disorders  The patient's weight, height, BMI, and visual acuity have been recorded in the chart. I have made referrals, counseling, and provided education to the patient based on review of the above and I have provided  the patient with a written personalized care plan for preventive services.

## 2013-08-26 NOTE — Patient Instructions (Addendum)
Continue same medications and return in 6 months 

## 2013-09-06 ENCOUNTER — Encounter: Payer: Self-pay | Admitting: Internal Medicine

## 2013-09-23 ENCOUNTER — Ambulatory Visit: Payer: Medicare Other

## 2013-09-23 ENCOUNTER — Ambulatory Visit (INDEPENDENT_AMBULATORY_CARE_PROVIDER_SITE_OTHER): Payer: Medicare Other | Admitting: Family Medicine

## 2013-09-23 VITALS — BP 122/66 | HR 62 | Temp 97.9°F | Resp 16 | Ht 67.75 in | Wt 149.2 lb

## 2013-09-23 DIAGNOSIS — M545 Low back pain: Secondary | ICD-10-CM | POA: Diagnosis not present

## 2013-09-23 MED ORDER — METHOCARBAMOL 500 MG PO TABS: 500.0000 mg | ORAL_TABLET | Freq: Three times a day (TID) | ORAL | Status: DC

## 2013-09-23 NOTE — Patient Instructions (Signed)
Cautiously use the robaxin (muscle relaxer) for your back pain if tylenol is not sufficient.  You can also try heat such as a heating pad, or icy hot.    If your back continues to bother you please le me know
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
## 2013-10-02 ENCOUNTER — Ambulatory Visit (INDEPENDENT_AMBULATORY_CARE_PROVIDER_SITE_OTHER): Payer: Medicare Other | Admitting: Internal Medicine

## 2013-10-02 ENCOUNTER — Telehealth: Payer: Self-pay

## 2013-10-02 ENCOUNTER — Encounter: Payer: Self-pay | Admitting: Internal Medicine

## 2013-10-02 VITALS — BP 130/74 | HR 64 | Temp 97.2°F | Ht 67.0 in | Wt 152.0 lb

## 2013-10-02 DIAGNOSIS — Z23 Encounter for immunization: Secondary | ICD-10-CM | POA: Diagnosis not present

## 2013-10-02 DIAGNOSIS — E039 Hypothyroidism, unspecified: Secondary | ICD-10-CM | POA: Diagnosis not present

## 2013-10-02 DIAGNOSIS — I10 Essential (primary) hypertension: Secondary | ICD-10-CM | POA: Diagnosis not present

## 2013-10-02 DIAGNOSIS — M545 Low back pain: Secondary | ICD-10-CM

## 2013-10-02 DIAGNOSIS — I2510 Atherosclerotic heart disease of native coronary artery without angina pectoris: Secondary | ICD-10-CM | POA: Diagnosis not present

## 2013-10-02 NOTE — Telephone Encounter (Signed)
Lower back pain in a pt with history of multiple back surgeries and many other medical problems. No evidence of acute fracture or other serious back injury. Suspect his pain is due to muscle spasm. He may also have nerve root impingement. However given his other problems would ike to avoid using prednisone if possible- per his report it has never helped him much anyway. Recommended that he first try tylenol, heat and OTC muscle rubs. However if he continues to have pain he can cautiously try robaxin. Cautioned regarding sedation with this medication  This is from your office visit note, do you want to try Robaxin?

## 2013-10-02 NOTE — Telephone Encounter (Signed)
Patient is calling to let dr copland know that he is not doing any better probably worse would like a call back

## 2013-10-02 NOTE — Patient Instructions (Addendum)
Take muscle relaxant at night which was prescribed at [facility]. Consider physical therapy. See Dr. Venetia Maxon in December. Return in 6 months for PE.

## 2013-10-02 NOTE — Telephone Encounter (Signed)
Called him back- I saw that he did see Dr. Lenord Fellers today.  He will see Dr. Venetia Maxon next month.  He will try the robaxin at bedtime and let me know if not helpful
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
## 2013-10-12 ENCOUNTER — Encounter: Payer: Self-pay | Admitting: Internal Medicine

## 2013-10-12 ENCOUNTER — Ambulatory Visit (INDEPENDENT_AMBULATORY_CARE_PROVIDER_SITE_OTHER): Payer: Medicare Other | Admitting: *Deleted

## 2013-10-12 DIAGNOSIS — I2589 Other forms of chronic ischemic heart disease: Secondary | ICD-10-CM | POA: Diagnosis not present

## 2013-10-12 DIAGNOSIS — I255 Ischemic cardiomyopathy: Secondary | ICD-10-CM

## 2013-10-12 DIAGNOSIS — I5022 Chronic systolic (congestive) heart failure: Secondary | ICD-10-CM

## 2013-10-12 DIAGNOSIS — I509 Heart failure, unspecified: Secondary | ICD-10-CM

## 2013-10-12 LAB — MDC_IDC_ENUM_SESS_TYPE_INCLINIC
Date Time Interrogation Session: 20141120143307
HighPow Impedance: 42 Ohm
HighPow Impedance: 42 Ohm
HighPow Impedance: 42 Ohm
HighPow Impedance: 43 Ohm
HighPow Impedance: 43 Ohm
HighPow Impedance: 43 Ohm
HighPow Impedance: 44 Ohm
HighPow Impedance: 45 Ohm
HighPow Impedance: 55 Ohm
HighPow Impedance: 55 Ohm
HighPow Impedance: 55 Ohm
HighPow Impedance: 55 Ohm
HighPow Impedance: 55 Ohm
HighPow Impedance: 56 Ohm
HighPow Impedance: 57 Ohm
HighPow Impedance: 57 Ohm
Lead Channel Impedance Value: 576 Ohm
Lead Channel Impedance Value: 584 Ohm
Lead Channel Impedance Value: 584 Ohm
Lead Channel Impedance Value: 600 Ohm
Lead Channel Impedance Value: 600 Ohm
Lead Channel Pacing Threshold Amplitude: 2.5 V
Lead Channel Sensing Intrinsic Amplitude: 16.9 mV
Lead Channel Sensing Intrinsic Amplitude: 17.1 mV
Lead Channel Sensing Intrinsic Amplitude: 17.2 mV
Lead Channel Sensing Intrinsic Amplitude: 17.2 mV
Lead Channel Sensing Intrinsic Amplitude: 17.3 mV
Lead Channel Sensing Intrinsic Amplitude: 17.3 mV
Lead Channel Sensing Intrinsic Amplitude: 17.4 mV
Lead Channel Sensing Intrinsic Amplitude: 17.4 mV
Lead Channel Sensing Intrinsic Amplitude: 17.4 mV
Zone Setting Detection Interval: 250 ms
Zone Setting Detection Interval: 350 ms

## 2013-10-12 NOTE — Progress Notes (Signed)
ICD check in clinic. Normal device function. Threshold and sensing consistent with previous device measurements. Impedance trends stable over time. No evidence of any ventricular arrhythmias. SIC=0. Histogram distribution appropriate for patient and level of activity. No changes made this session. Device programmed at appropriate safety margins. Device programmed to optimize intrinsic conduction. Batt voltage 2.82V (ERI 2.62V).  Plan to follow up with the device clinic on 01-15-2014 and with GT in August 2015.

## 2013-11-02 DIAGNOSIS — IMO0002 Reserved for concepts with insufficient information to code with codable children: Secondary | ICD-10-CM | POA: Diagnosis not present

## 2013-11-02 DIAGNOSIS — I10 Essential (primary) hypertension: Secondary | ICD-10-CM | POA: Diagnosis not present

## 2013-11-02 DIAGNOSIS — M545 Low back pain: Secondary | ICD-10-CM | POA: Diagnosis not present

## 2013-11-02 DIAGNOSIS — M48061 Spinal stenosis, lumbar region without neurogenic claudication: Secondary | ICD-10-CM | POA: Diagnosis not present

## 2013-11-30 ENCOUNTER — Encounter: Payer: Self-pay | Admitting: Internal Medicine

## 2013-11-30 ENCOUNTER — Ambulatory Visit (INDEPENDENT_AMBULATORY_CARE_PROVIDER_SITE_OTHER): Payer: Medicare Other | Admitting: Internal Medicine

## 2013-11-30 VITALS — BP 142/76 | HR 68 | Temp 98.6°F | Wt 151.5 lb

## 2013-11-30 DIAGNOSIS — J069 Acute upper respiratory infection, unspecified: Secondary | ICD-10-CM

## 2013-11-30 MED ORDER — AZITHROMYCIN 250 MG PO TABS: ORAL_TABLET | ORAL | Status: DC

## 2013-11-30 NOTE — Patient Instructions (Addendum)
Take Z pak as directed.

## 2013-11-30 NOTE — Progress Notes (Signed)
   Subjective:    Patient ID: Corey Jordan, male    DOB: December 25, 1933, 80 y.o.   MRN: [id number]  HPI 80 year old white male with history of ischemic cardiomyopathy in today with URI symptoms for one week. No fever, myalgias, or shaking chills. Mostly congestion and slight cough. Slight malaise.    Review of Systems     Objective:   Physical Exam Sounds nasally congested. TMs are clear. Pharynx very slightly injected without exudate. Neck is supple without significant adenopathy. Chest clear to auscultation without rales or wheezing. No JVD        Assessment & Plan:  Acute URI  Plan: Zithromax Z-Pak take as directed. Says he does not need anything for cough.

## 2013-12-12 DIAGNOSIS — H6120 Impacted cerumen, unspecified ear: Secondary | ICD-10-CM | POA: Diagnosis not present

## 2013-12-12 DIAGNOSIS — B370 Candidal stomatitis: Secondary | ICD-10-CM | POA: Diagnosis not present

## 2014-01-01 DIAGNOSIS — D1801 Hemangioma of skin and subcutaneous tissue: Secondary | ICD-10-CM | POA: Diagnosis not present

## 2014-01-01 DIAGNOSIS — L570 Actinic keratosis: Secondary | ICD-10-CM | POA: Diagnosis not present

## 2014-01-01 DIAGNOSIS — Z85828 Personal history of other malignant neoplasm of skin: Secondary | ICD-10-CM | POA: Diagnosis not present

## 2014-01-01 DIAGNOSIS — D239 Other benign neoplasm of skin, unspecified: Secondary | ICD-10-CM | POA: Diagnosis not present

## 2014-01-01 DIAGNOSIS — L821 Other seborrheic keratosis: Secondary | ICD-10-CM | POA: Diagnosis not present

## 2014-01-15 ENCOUNTER — Ambulatory Visit (INDEPENDENT_AMBULATORY_CARE_PROVIDER_SITE_OTHER): Payer: Medicare Other | Admitting: *Deleted

## 2014-01-15 DIAGNOSIS — I2589 Other forms of chronic ischemic heart disease: Secondary | ICD-10-CM | POA: Diagnosis not present

## 2014-01-15 DIAGNOSIS — I255 Ischemic cardiomyopathy: Secondary | ICD-10-CM

## 2014-01-15 LAB — MDC_IDC_ENUM_SESS_TYPE_INCLINIC
Battery Voltage: 2.74 V
Brady Statistic RV Percent Paced: 0 %
Date Time Interrogation Session: 20150223143309
HIGH POWER IMPEDANCE MEASURED VALUE: 41 Ohm
HIGH POWER IMPEDANCE MEASURED VALUE: 43 Ohm
HIGH POWER IMPEDANCE MEASURED VALUE: 43 Ohm
HIGH POWER IMPEDANCE MEASURED VALUE: 44 Ohm
HIGH POWER IMPEDANCE MEASURED VALUE: 44 Ohm
HIGH POWER IMPEDANCE MEASURED VALUE: 47 Ohm
HIGH POWER IMPEDANCE MEASURED VALUE: 53 Ohm
HIGH POWER IMPEDANCE MEASURED VALUE: 55 Ohm
HIGH POWER IMPEDANCE MEASURED VALUE: 55 Ohm
HIGH POWER IMPEDANCE MEASURED VALUE: 55 Ohm
HIGH POWER IMPEDANCE MEASURED VALUE: 58 Ohm
HighPow Impedance: 42 Ohm
HighPow Impedance: 43 Ohm
HighPow Impedance: 43 Ohm
HighPow Impedance: 44 Ohm
HighPow Impedance: 45 Ohm
HighPow Impedance: 45 Ohm
HighPow Impedance: 45 Ohm
HighPow Impedance: 47 Ohm
HighPow Impedance: 49 Ohm
HighPow Impedance: 49 Ohm
HighPow Impedance: 52 Ohm
HighPow Impedance: 55 Ohm
HighPow Impedance: 56 Ohm
HighPow Impedance: 56 Ohm
HighPow Impedance: 58 Ohm
HighPow Impedance: 60 Ohm
HighPow Impedance: 63 Ohm
HighPow Impedance: 65 Ohm
HighPow Impedance: 66 Ohm
HighPow Impedance: 66 Ohm
Lead Channel Impedance Value: 560 Ohm
Lead Channel Impedance Value: 568 Ohm
Lead Channel Impedance Value: 568 Ohm
Lead Channel Impedance Value: 568 Ohm
Lead Channel Impedance Value: 576 Ohm
Lead Channel Impedance Value: 584 Ohm
Lead Channel Impedance Value: 600 Ohm
Lead Channel Impedance Value: 624 Ohm
Lead Channel Impedance Value: 656 Ohm
Lead Channel Sensing Intrinsic Amplitude: 16.9 mV
Lead Channel Sensing Intrinsic Amplitude: 16.9 mV
Lead Channel Sensing Intrinsic Amplitude: 17.3 mV
Lead Channel Sensing Intrinsic Amplitude: 17.3 mV
Lead Channel Sensing Intrinsic Amplitude: 17.4 mV
Lead Channel Sensing Intrinsic Amplitude: 17.4 mV
Lead Channel Sensing Intrinsic Amplitude: 17.4 mV
Lead Channel Sensing Intrinsic Amplitude: 17.4 mV
Lead Channel Sensing Intrinsic Amplitude: 17.4 mV
MDC IDC MSMT LEADCHNL RV IMPEDANCE VALUE: 544 Ohm
MDC IDC MSMT LEADCHNL RV IMPEDANCE VALUE: 560 Ohm
MDC IDC MSMT LEADCHNL RV IMPEDANCE VALUE: 584 Ohm
MDC IDC MSMT LEADCHNL RV IMPEDANCE VALUE: 584 Ohm
MDC IDC MSMT LEADCHNL RV IMPEDANCE VALUE: 656 Ohm
MDC IDC MSMT LEADCHNL RV IMPEDANCE VALUE: 712 Ohm
MDC IDC MSMT LEADCHNL RV SENSING INTR AMPL: 17.1 mV
MDC IDC MSMT LEADCHNL RV SENSING INTR AMPL: 17.1 mV
MDC IDC MSMT LEADCHNL RV SENSING INTR AMPL: 17.2 mV
MDC IDC MSMT LEADCHNL RV SENSING INTR AMPL: 17.2 mV
MDC IDC MSMT LEADCHNL RV SENSING INTR AMPL: 17.2 mV
MDC IDC MSMT LEADCHNL RV SENSING INTR AMPL: 17.2 mV
MDC IDC SET LEADCHNL RV PACING AMPLITUDE: 2.5 V
MDC IDC SET LEADCHNL RV PACING PULSEWIDTH: 0.4 ms
MDC IDC SET LEADCHNL RV SENSING SENSITIVITY: 0.3 mV
MDC IDC SET ZONE DETECTION INTERVAL: 300 ms
Zone Setting Detection Interval: 250 ms
Zone Setting Detection Interval: 350 ms

## 2014-01-15 NOTE — Progress Notes (Signed)
ICD check in clinic. Normal device function. Thresholds and sensing consistent with previous device measurements. Impedance trends stable over time. No evidence of any ventricular arrhythmias. No mode switches. Histogram distribution appropriate for patient and level of activity. No changes made this session. Device programmed at appropriate safety margins. Device programmed to optimize intrinsic conduction.  Patient education completed including shock plan. Alert tones/vibration demonstrated for patient.  ROV 3  months with the device clinic.

## 2014-01-19 ENCOUNTER — Encounter: Payer: Self-pay | Admitting: Internal Medicine

## 2014-01-26 ENCOUNTER — Other Ambulatory Visit: Payer: Self-pay | Admitting: Cardiology

## 2014-02-26 ENCOUNTER — Other Ambulatory Visit: Payer: Self-pay | Admitting: Cardiology

## 2014-03-27 ENCOUNTER — Other Ambulatory Visit: Payer: Self-pay | Admitting: Cardiology

## 2014-03-29 ENCOUNTER — Ambulatory Visit (INDEPENDENT_AMBULATORY_CARE_PROVIDER_SITE_OTHER): Payer: Medicare Other | Admitting: Cardiology

## 2014-03-29 ENCOUNTER — Encounter: Payer: Self-pay | Admitting: *Deleted

## 2014-03-29 ENCOUNTER — Encounter: Payer: Self-pay | Admitting: Cardiology

## 2014-03-29 VITALS — BP 168/78 | HR 58 | Ht 67.0 in | Wt 153.0 lb

## 2014-03-29 DIAGNOSIS — Z95810 Presence of automatic (implantable) cardiac defibrillator: Secondary | ICD-10-CM

## 2014-03-29 DIAGNOSIS — I2510 Atherosclerotic heart disease of native coronary artery without angina pectoris: Secondary | ICD-10-CM

## 2014-03-29 DIAGNOSIS — I10 Essential (primary) hypertension: Secondary | ICD-10-CM

## 2014-03-29 DIAGNOSIS — E785 Hyperlipidemia, unspecified: Secondary | ICD-10-CM

## 2014-03-29 NOTE — Progress Notes (Signed)
HPI: 80 year old male previously followed by Dr. Verl Jordan for followup of coronary artery disease. Abdominal CT in 2005 showed no aneurysm. Patient had a myocardial infarction in 2004. He had PCI of his LAD. His ejection fraction was 30%. Last catheterization in 2007 showed patent stents in the LAD. There is an 80% ostial first diagonal, occluded second diagonal and normal circumflex and right coronary artery. Ejection fraction was 30%. Patient had an ICD placed. Last echocardiogram in December 2012 showed an ejection fraction of 30-35%. There was grade 1 diastolic dysfunction. There was mild left atrial enlargement. Since he was last seen, He denies dyspnea, syncope or pedal edema. He occasionally has brief chest pain that he takes nitroglycerin for. It is not exertional. He has had this intermittently since his MI.   Current Outpatient Prescriptions  Medication Sig Dispense Refill  . aspirin 81 MG EC tablet Take 81 mg by mouth daily.        Marland Kitchen atorvastatin (LIPITOR) 40 MG tablet TAKE 1 TABLET DAILY  90 tablet  1  . calcium carbonate (OS-CAL) 600 MG TABS Take 600 mg by mouth 2 (two) times daily with a meal.        . carvedilol (COREG) 6.25 MG tablet TAKE 1 TABLET TWICE A DAY  WITH MEALS  180 tablet  0  . cholecalciferol (VITAMIN D) 1000 UNITS tablet Take 2,000 Units by mouth daily.        . clopidogrel (PLAVIX) 75 MG tablet TAKE 1 TABLET DAILY  90 tablet  3  . furosemide (LASIX) 20 MG tablet Take 20 mg by mouth as needed.      Marland Kitchen levothyroxine (SYNTHROID, LEVOTHROID) 50 MCG tablet Take 1 tablet (50 mcg total) by mouth every morning.  90 tablet  3  . nitroGLYCERIN (NITROSTAT) 0.4 MG SL tablet Place 1 tablet (0.4 mg total) under the tongue every 5 (five) minutes as needed. For chest pain  25 tablet  6  . omeprazole (PRILOSEC) 20 MG capsule Take 1 capsule (20 mg total) by mouth daily.  90 capsule  3  . ramipril (ALTACE) 2.5 MG capsule TAKE 1 CAPSULE EVERY       MORNING  90 capsule  3  .  spironolactone (ALDACTONE) 25 MG tablet TAKE 1/2 TABLET (=12.5MG )  DAILY  45 tablet  0   No current facility-administered medications for this visit.     Past Medical History  Diagnosis Date  . 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  . Cardiac defibrillator in situ     s/p MDT Maximo VR single lead ICD - 07/06/2006  . Hyperlipidemia   . Hypertension   . Ischemic cardiomyopathy     EF 40% 12/2008 V gram;  Echocardiogram 11/08/11: EF 30-35%, inferoseptal and anteroseptal, apical septal/lateral/anterior/inferior and true apical akinesis, no LV thrombus, grade 1 diastolic dysfunction.  Marland Kitchen History of orthostatic hypotension   . Degenerative joint disease   . Hypothyroidism   . Premature atrial contractions   . Premature ventricular contractions   . Systolic CHF, chronic   . Lumbar spondylosis     s/p L5-S1 fusion 12/11/2009  . Skin cancer     skin  .  H/O hiatal hernia     times 3  . Spondylosis     Past Surgical History  Procedure Laterality Date  . Icd      s/p MDT Maximo VR single lead ICD - 07/06/2006  . Lumbar fusion      s/p L5-S1 fusion 12/11/2009  . Back surgery      History   Social History  . Marital Status: Married    Spouse Name: [name]    Number of Children: [name]  . Years of Education: [name]   Occupational History  . retired [name]   Social History Main Topics  . Smoking status: Never Smoker   . Smokeless tobacco: Not on file  . Alcohol Use: No  . Drug Use: No  . Sexual Activity: No   Other Topics Concern  . Not on file   Social History Narrative   Active, walks regularly.    ROS: no fevers or chills, productive cough, hemoptysis, dysphasia, odynophagia,  melena, hematochezia, dysuria, hematuria, rash, seizure activity, orthopnea, PND, pedal edema, claudication. Remaining systems are negative.  Physical Exam: Well-developed well-nourished in no acute distress.  Skin is warm and dry.  HEENT is normal.  Neck is supple.  Chest is clear to auscultation with normal expansion.  Cardiovascular exam is regular rate and rhythm.  Abdominal exam nontender or distended. No masses palpated. Extremities show no edema. neuro grossly intact  ECG Sinus rhythm at a rate of 58. Left ventricular hypertrophy with repolarization abnormality. Prior septal infarct with T wave inversion V1 and V2.

## 2014-03-29 NOTE — Assessment & Plan Note (Signed)
Management per electrophysiology. 

## 2014-03-29 NOTE — Assessment & Plan Note (Signed)
Continue ACE inhibitor and beta blocker. ICD in place.

## 2014-03-29 NOTE — Patient Instructions (Addendum)
Your physician wants you to follow-up in: ONE YEAR WITH DR CRENSHAW You will receive a reminder letter in the mail two months in advance. If you don't receive a letter, please call our office to schedule the follow-up appointment.   Your physician has requested that you have a lexiscan myoview. For further information please visit [email or web address]. Please follow instruction sheet, as given.   STOP PLAVIX 

## 2014-03-29 NOTE — Assessment & Plan Note (Signed)
Blood pressure mildly elevated but he follows this at home and systolic is typically 494 to 130. Continue present medications and follow.

## 2014-03-29 NOTE — Assessment & Plan Note (Signed)
Continue aspirin. Discontinue Plavix. Continue statin. He has occasional chest pain she has been intermittent since his infarct. Schedule nuclear study for risk stratification.

## 2014-03-29 NOTE — Assessment & Plan Note (Signed)
Continue statin. Lipids and liver monitored by primary care. 

## 2014-04-05 ENCOUNTER — Other Ambulatory Visit: Payer: Medicare Other | Admitting: Internal Medicine

## 2014-04-05 DIAGNOSIS — Z79899 Other long term (current) drug therapy: Secondary | ICD-10-CM

## 2014-04-05 DIAGNOSIS — E785 Hyperlipidemia, unspecified: Secondary | ICD-10-CM | POA: Diagnosis not present

## 2014-04-05 DIAGNOSIS — D485 Neoplasm of uncertain behavior of skin: Secondary | ICD-10-CM | POA: Diagnosis not present

## 2014-04-05 DIAGNOSIS — Z131 Encounter for screening for diabetes mellitus: Secondary | ICD-10-CM

## 2014-04-05 DIAGNOSIS — E039 Hypothyroidism, unspecified: Secondary | ICD-10-CM | POA: Diagnosis not present

## 2014-04-05 DIAGNOSIS — Z130 Encounter for screening for diseases of the blood and blood-forming organs and certain disorders involving the immune mechanism: Secondary | ICD-10-CM

## 2014-04-05 DIAGNOSIS — Z125 Encounter for screening for malignant neoplasm of prostate: Secondary | ICD-10-CM

## 2014-04-05 DIAGNOSIS — Z85828 Personal history of other malignant neoplasm of skin: Secondary | ICD-10-CM | POA: Diagnosis not present

## 2014-04-05 DIAGNOSIS — C4442 Squamous cell carcinoma of skin of scalp and neck: Secondary | ICD-10-CM | POA: Diagnosis not present

## 2014-04-05 LAB — COMPREHENSIVE METABOLIC PANEL
ALT: 15 U/L (ref 0–53)
AST: 18 U/L (ref 0–37)
Albumin: 4.2 g/dL (ref 3.5–5.2)
Alkaline Phosphatase: 80 U/L (ref 39–117)
BUN: 18 mg/dL (ref 6–23)
CO2: 32 mEq/L (ref 19–32)
Calcium: 9.9 mg/dL (ref 8.4–10.5)
Chloride: 99 mEq/L (ref 96–112)
Creat: 0.9 mg/dL (ref 0.50–1.35)
GLUCOSE: 98 mg/dL (ref 70–99)
POTASSIUM: 4.5 meq/L (ref 3.5–5.3)
Sodium: 136 mEq/L (ref 135–145)
TOTAL PROTEIN: 6.6 g/dL (ref 6.0–8.3)
Total Bilirubin: 0.8 mg/dL (ref 0.2–1.2)

## 2014-04-05 LAB — CBC WITH DIFFERENTIAL/PLATELET
BASOS ABS: 0 10*3/uL (ref 0.0–0.1)
Basophils Relative: 0 % (ref 0–1)
Eosinophils Absolute: 0.1 10*3/uL (ref 0.0–0.7)
Eosinophils Relative: 2 % (ref 0–5)
HEMATOCRIT: 40.2 % (ref 39.0–52.0)
HEMOGLOBIN: 13.8 g/dL (ref 13.0–17.0)
LYMPHS PCT: 19 % (ref 12–46)
Lymphs Abs: 1 10*3/uL (ref 0.7–4.0)
MCH: 31 pg (ref 26.0–34.0)
MCHC: 34.3 g/dL (ref 30.0–36.0)
MCV: 90.3 fL (ref 78.0–100.0)
MONO ABS: 0.6 10*3/uL (ref 0.1–1.0)
MONOS PCT: 12 % (ref 3–12)
NEUTROS ABS: 3.5 10*3/uL (ref 1.7–7.7)
Neutrophils Relative %: 67 % (ref 43–77)
Platelets: 199 10*3/uL (ref 150–400)
RBC: 4.45 MIL/uL (ref 4.22–5.81)
RDW: 14 % (ref 11.5–15.5)
WBC: 5.2 10*3/uL (ref 4.0–10.5)

## 2014-04-05 LAB — LIPID PANEL
CHOLESTEROL: 162 mg/dL (ref 0–200)
HDL: 71 mg/dL (ref >39)
LDL Cholesterol: 82 mg/dL (ref 0–99)
TRIGLYCERIDES: 43 mg/dL (ref <150)
Total CHOL/HDL Ratio: 2.3 Ratio
VLDL: 9 mg/dL (ref 0–40)

## 2014-04-05 LAB — HEMOGLOBIN A1C
Hgb A1c MFr Bld: 5.9 % — ABNORMAL HIGH (ref <5.7)
MEAN PLASMA GLUCOSE: 123 mg/dL — AB (ref <117)

## 2014-04-05 LAB — TSH: TSH: 2.723 u[IU]/mL (ref 0.350–4.500)

## 2014-04-06 ENCOUNTER — Encounter: Payer: Self-pay | Admitting: Internal Medicine

## 2014-04-06 ENCOUNTER — Ambulatory Visit (INDEPENDENT_AMBULATORY_CARE_PROVIDER_SITE_OTHER): Payer: Medicare Other | Admitting: Internal Medicine

## 2014-04-06 VITALS — BP 156/88 | HR 60 | Temp 98.8°F | Ht 67.0 in | Wt 150.0 lb

## 2014-04-06 DIAGNOSIS — Z0000 Encounter for general adult medical examination without abnormal findings: Secondary | ICD-10-CM

## 2014-04-06 DIAGNOSIS — E039 Hypothyroidism, unspecified: Secondary | ICD-10-CM

## 2014-04-06 DIAGNOSIS — K219 Gastro-esophageal reflux disease without esophagitis: Secondary | ICD-10-CM | POA: Diagnosis not present

## 2014-04-06 DIAGNOSIS — IMO0001 Reserved for inherently not codable concepts without codable children: Secondary | ICD-10-CM

## 2014-04-06 DIAGNOSIS — I10 Essential (primary) hypertension: Secondary | ICD-10-CM | POA: Diagnosis not present

## 2014-04-06 DIAGNOSIS — I259 Chronic ischemic heart disease, unspecified: Secondary | ICD-10-CM

## 2014-04-06 DIAGNOSIS — M519 Unspecified thoracic, thoracolumbar and lumbosacral intervertebral disc disorder: Secondary | ICD-10-CM

## 2014-04-06 LAB — POCT URINALYSIS DIPSTICK
Bilirubin, UA: NEGATIVE
Blood, UA: NEGATIVE
GLUCOSE UA: NEGATIVE
Ketones, UA: NEGATIVE
LEUKOCYTES UA: NEGATIVE
Nitrite, UA: NEGATIVE
Protein, UA: NEGATIVE
Spec Grav, UA: 1.02
UROBILINOGEN UA: NEGATIVE
pH, UA: 5.5

## 2014-04-06 LAB — PSA, MEDICARE: PSA: 3.7 ng/mL (ref <=4.00)

## 2014-04-06 MED ORDER — LEVOTHYROXINE SODIUM 50 MCG PO TABS: 50.0000 ug | ORAL_TABLET | ORAL | Status: DC

## 2014-04-06 NOTE — Progress Notes (Signed)
Subjective:    Patient ID: Corey Jordan, male    DOB: 03-Apr-1934, 80 y.o.   MRN: [id number]  HPI 80 year old White Male for health maintenance and evaluation of medical issues. Had skin cancer removed from scalp recently by Dr. Wilhemina Bonito. This is a new area and has concerned him. Feels"foolish" in his head for 2 months. Not specific about what this means. Worried about his skin. Has cataracts. Seen at [facility] 8 months ago. History of white coat syndrome with BP. Denies noncompliance with meds.  History of chronic ischemic heart disease, has internal cardiac defibrillator, lumbar disc disease, hypothyroidism, GE reflux.  History of ischemic cardiomyopathy with ejection fraction of 30% on catheterization August 2007. History of class II congestive heart failure. Has dyslipidemia. Had implantation of Medtronic cardioverter defibrillator August 2007. Followed by Dr. Lovena Le.  Patient had myocardial infarction in 2004 with total occlusion of the LAD which was stented x2. Right coronary artery had 40% proximal lesion. Circumflex was normal. Patient had 80% diagonal lesion. Was noted at that time to have ejection fraction between 30 and 35%.  History of spondylosis and herniated disc with left radiculopathy L5 and S1 status post L5-S1 fusion by Dr. Carloyn Manner in January 2011.  Zostavax vaccine April 2011. Pneumovax vaccine January 2010. Tetanus immunization April 2011.  Colonoscopy by Dr. Deatra Ina October 2005.  History of left bundle hernia repair by Dr. Milbert Coulter in the summer of 2000.  Social history: He is retired from the post [employer]. He is married with 2 adult children. Does not smoke or consume alcohol.  Family history: Mother died of pneumonia at age 93. Father died of heart failure at age 74. One brother died at age 1 of cancer. Father had a history of venous thrombosis and apparently had a leg amputation.         Review of Systems  Constitutional: Positive for fatigue.    Cardiovascular:       History of low ejection fraction with internal cardiac defibrillator  Gastrointestinal:       GERD  Endocrine:       History of hypothyroidism       Objective:   Physical Exam  Vitals reviewed. Constitutional: He is oriented to person, place, and time. He appears well-developed and well-nourished. No distress.  HENT:  Head: Normocephalic and atraumatic.  Right Ear: External ear normal.  Left Ear: External ear normal.  Mouth/Throat: Oropharynx is clear and moist. No oropharyngeal exudate.  Eyes: Conjunctivae and EOM are normal. Pupils are equal, round, and reactive to light. Right eye exhibits no discharge. Left eye exhibits no discharge. No scleral icterus.  Neck: Neck supple. No JVD present. No thyromegaly present.  Cardiovascular: Normal rate, regular rhythm, normal heart sounds and intact distal pulses.   No murmur heard. Pulmonary/Chest: Effort normal and breath sounds normal. No respiratory distress. He has no wheezes. He has no rales. He exhibits no tenderness.  Abdominal: Soft. Bowel sounds are normal. He exhibits no distension and no mass. There is no tenderness. There is no rebound and no guarding.  Genitourinary: Prostate normal.  Musculoskeletal: Normal range of motion. He exhibits no edema.  Lymphadenopathy:    He has no cervical adenopathy.  Neurological: He is alert and oriented to person, place, and time. He has normal reflexes. He displays normal reflexes. No cranial nerve deficit. Coordination normal.  Skin: Skin is warm and dry. No rash noted. He is not diaphoretic.  Psychiatric: He has a normal mood and affect.  His behavior is normal. Judgment and thought content normal.          Assessment & Plan:  Vague sensation of feeling "flu" in his he had. Neurological exam is normal.  Chronic ischemic heart disease  Lumbar disc disease  Hypothyroidism  History of placement of internal cardiac defibrillator  GE reflux  Office  hypertension  Hyperlipidemia  History of coronary artery disease  Plan: Continue same medications and return in 6 months.  Subjective:   Patient presents for Medicare Annual/Subsequent preventive examination.  Review Past Medical/Family/Social:   Risk Factors  Current exercise habits: sedentary Dietary issues discussed: low fat low carb  Cardiac risk factors: Hypertension, hyperlipidemia, family history  Depression Screen  (Note: if answer to either of the following is "Yes", a more complete depression screening is indicated)   Over the past two weeks, have you felt down, depressed or hopeless? No  Over the past two weeks, have you felt little interest or pleasure in doing things? No Have you lost interest or pleasure in daily life? No Do you often feel hopeless? No Do you cry easily over simple problems? No   Activities of Daily Living  In your present state of health, do you have any difficulty performing the following activities?:   Driving? No  Managing money? No  Feeding yourself? No  Getting from bed to chair? No  Climbing a flight of stairs? No  Preparing food and eating?: No  Bathing or showering? No  Getting dressed: No  Getting to the toilet? No  Using the toilet:No  Moving around from place to place: No  In the past year have you fallen or had a near fall?:No  Are you sexually active? yes Do you have more than one partner? No   Hearing Difficulties: No  Do you often ask people to speak up or repeat themselves? No  Do you experience ringing or noises in your ears? No  Do you have difficulty understanding soft or whispered voices? No  Do you feel that you have a problem with memory? No Do you often misplace items? No    Home Safety:  Do you have a smoke alarm at your residence? Yes Do you have grab bars in the bathroom?no Do you have throw rugs in your house?no   Cognitive Testing  Alert? Yes Normal Appearance?Yes  Oriented to person? Yes Place?  Yes  Time? Yes  Recall of three objects? Yes  Can perform simple calculations? Yes  Displays appropriate judgment?Yes  Can read the correct time from a watch face?Yes   List the Names of Other Physician/Practitioners you currently use:  See referral list for the physicians patient is currently seeing.  Dr. Crissie Sickles Dermatologist   Review of Systems: see above   Objective:     General appearance: Appears stated age  Head: Normocephalic, without obvious abnormality, atraumatic  Eyes: conj clear, EOMi PEERLA  Ears: normal TM's and external ear canals both ears  Nose: Nares normal. Septum midline. Mucosa normal. No drainage or sinus tenderness.  Throat: lips, mucosa, and tongue normal; teeth and gums normal  Neck: no adenopathy, no carotid bruit, no JVD, supple, symmetrical, trachea midline and thyroid not enlarged, symmetric, no tenderness/mass/nodules  No CVA tenderness.  Lungs: clear to auscultation bilaterally  Breasts: normal appearance, no masses or tenderness Heart: regular rate and rhythm, S1, S2 normal, no murmur, click, rub or gallop  Abdomen: soft, non-tender; bowel sounds normal; no masses, no organomegaly  Musculoskeletal: ROM normal in  all joints, no crepitus, no deformity, Normal muscle strengthen. Back  is symmetric, no curvature. Skin: Skin color, texture, turgor normal. No rashes or lesions  Lymph nodes: Cervical, supraclavicular, and axillary nodes normal.  Neurologic: CN 2 -12 Normal, Normal symmetric reflexes. Normal coordination and gait  Psych: Alert & Oriented x 3, Mood appear stable.    Assessment:    Annual wellness medicare exam   Plan:    During the course of the visit the patient was educated and counseled about appropriate screening and preventive services including:   Annual PSA and DRE Annual flu vaccine     Patient Instructions (the written plan) was given to the patient.  Medicare Attestation  I have personally reviewed:  The  patient's medical and social history  Their use of alcohol, tobacco or illicit drugs  Their current medications and supplements  The patient's functional ability including ADLs,fall risks, home safety risks, cognitive, and hearing and visual impairment  Diet and physical activities  Evidence for depression or mood disorders  The patient's weight, height, BMI, and visual acuity have been recorded in the chart. I have made referrals, counseling, and provided education to the patient based on review of the above and I have provided the patient with a written personalized care plan for preventive services.

## 2014-04-17 ENCOUNTER — Encounter (HOSPITAL_COMMUNITY): Payer: Medicare Other

## 2014-04-18 ENCOUNTER — Ambulatory Visit (INDEPENDENT_AMBULATORY_CARE_PROVIDER_SITE_OTHER): Payer: Medicare Other | Admitting: *Deleted

## 2014-04-18 ENCOUNTER — Encounter: Payer: Self-pay | Admitting: Internal Medicine

## 2014-04-18 ENCOUNTER — Ambulatory Visit (HOSPITAL_COMMUNITY): Payer: Medicare Other | Attending: Cardiovascular Disease | Admitting: Radiology

## 2014-04-18 VITALS — BP 147/75 | HR 51 | Ht 67.0 in | Wt 150.0 lb

## 2014-04-18 DIAGNOSIS — I255 Ischemic cardiomyopathy: Secondary | ICD-10-CM

## 2014-04-18 DIAGNOSIS — R079 Chest pain, unspecified: Secondary | ICD-10-CM

## 2014-04-18 DIAGNOSIS — I2510 Atherosclerotic heart disease of native coronary artery without angina pectoris: Secondary | ICD-10-CM | POA: Diagnosis not present

## 2014-04-18 DIAGNOSIS — I2589 Other forms of chronic ischemic heart disease: Secondary | ICD-10-CM | POA: Diagnosis not present

## 2014-04-18 LAB — MDC_IDC_ENUM_SESS_TYPE_INCLINIC
Brady Statistic RV Percent Paced: 0 %
HIGH POWER IMPEDANCE MEASURED VALUE: 43 Ohm
HIGH POWER IMPEDANCE MEASURED VALUE: 43 Ohm
HIGH POWER IMPEDANCE MEASURED VALUE: 43 Ohm
HIGH POWER IMPEDANCE MEASURED VALUE: 44 Ohm
HIGH POWER IMPEDANCE MEASURED VALUE: 44 Ohm
HIGH POWER IMPEDANCE MEASURED VALUE: 54 Ohm
HIGH POWER IMPEDANCE MEASURED VALUE: 55 Ohm
HIGH POWER IMPEDANCE MEASURED VALUE: 56 Ohm
HIGH POWER IMPEDANCE MEASURED VALUE: 59 Ohm
HIGH POWER IMPEDANCE MEASURED VALUE: 63 Ohm
HighPow Impedance: 42 Ohm
HighPow Impedance: 42 Ohm
HighPow Impedance: 43 Ohm
HighPow Impedance: 43 Ohm
HighPow Impedance: 43 Ohm
HighPow Impedance: 43 Ohm
HighPow Impedance: 44 Ohm
HighPow Impedance: 44 Ohm
HighPow Impedance: 44 Ohm
HighPow Impedance: 45 Ohm
HighPow Impedance: 46 Ohm
HighPow Impedance: 54 Ohm
HighPow Impedance: 54 Ohm
HighPow Impedance: 55 Ohm
HighPow Impedance: 55 Ohm
HighPow Impedance: 55 Ohm
HighPow Impedance: 56 Ohm
HighPow Impedance: 56 Ohm
HighPow Impedance: 56 Ohm
HighPow Impedance: 57 Ohm
HighPow Impedance: 58 Ohm
Lead Channel Impedance Value: 528 Ohm
Lead Channel Impedance Value: 528 Ohm
Lead Channel Impedance Value: 536 Ohm
Lead Channel Impedance Value: 536 Ohm
Lead Channel Impedance Value: 544 Ohm
Lead Channel Impedance Value: 552 Ohm
Lead Channel Impedance Value: 560 Ohm
Lead Channel Impedance Value: 560 Ohm
Lead Channel Impedance Value: 560 Ohm
Lead Channel Impedance Value: 560 Ohm
Lead Channel Impedance Value: 568 Ohm
Lead Channel Pacing Threshold Amplitude: 2.5 V
Lead Channel Pacing Threshold Pulse Width: 0.1 ms
Lead Channel Sensing Intrinsic Amplitude: 16.9 mV
Lead Channel Sensing Intrinsic Amplitude: 17 mV
Lead Channel Sensing Intrinsic Amplitude: 17 mV
Lead Channel Sensing Intrinsic Amplitude: 17 mV
Lead Channel Sensing Intrinsic Amplitude: 17.1 mV
Lead Channel Sensing Intrinsic Amplitude: 17.1 mV
Lead Channel Sensing Intrinsic Amplitude: 17.1 mV
Lead Channel Sensing Intrinsic Amplitude: 17.3 mV
Lead Channel Sensing Intrinsic Amplitude: 17.4 mV
Lead Channel Sensing Intrinsic Amplitude: 17.4 mV
Lead Channel Setting Pacing Amplitude: 2.5 V
Lead Channel Setting Pacing Pulse Width: 0.4 ms
MDC IDC MSMT BATTERY VOLTAGE: 2.69 V
MDC IDC MSMT LEADCHNL RV IMPEDANCE VALUE: 544 Ohm
MDC IDC MSMT LEADCHNL RV IMPEDANCE VALUE: 560 Ohm
MDC IDC MSMT LEADCHNL RV IMPEDANCE VALUE: 560 Ohm
MDC IDC MSMT LEADCHNL RV IMPEDANCE VALUE: 568 Ohm
MDC IDC MSMT LEADCHNL RV SENSING INTR AMPL: 16.9 mV
MDC IDC MSMT LEADCHNL RV SENSING INTR AMPL: 17 mV
MDC IDC MSMT LEADCHNL RV SENSING INTR AMPL: 17.3 mV
MDC IDC MSMT LEADCHNL RV SENSING INTR AMPL: 17.3 mV
MDC IDC MSMT LEADCHNL RV SENSING INTR AMPL: 17.5 mV
MDC IDC SESS DTM: 20150527091236
MDC IDC SET LEADCHNL RV SENSING SENSITIVITY: 0.3 mV
Zone Setting Detection Interval: 250 ms
Zone Setting Detection Interval: 300 ms
Zone Setting Detection Interval: 350 ms

## 2014-04-18 MED ORDER — REGADENOSON 0.4 MG/5ML IV SOLN
0.4000 mg | Freq: Once | INTRAVENOUS | Status: AC
  Administered 2014-04-18: 0.4 mg via INTRAVENOUS

## 2014-04-18 MED ORDER — TECHNETIUM TC 99M SESTAMIBI GENERIC - CARDIOLITE
33.0000 | Freq: Once | INTRAVENOUS | Status: AC | PRN
  Administered 2014-04-18: 33 via INTRAVENOUS

## 2014-04-18 MED ORDER — TECHNETIUM TC 99M SESTAMIBI GENERIC - CARDIOLITE
11.0000 | Freq: Once | INTRAVENOUS | Status: AC | PRN
  Administered 2014-04-18: 11 via INTRAVENOUS

## 2014-04-18 NOTE — Progress Notes (Signed)
ICD check in clinic. Normal device function. Threshold and sensing consistent with previous device measurements. Impedance trends stable over time. SIC=0. No evidence of any ventricular arrhythmias. Histogram distribution appropriate for patient and level of activity. No changes made this session. Device programmed at appropriate safety margins. Device programmed to optimize intrinsic conduction. Batt voltage 2.69V (ERI 2.62V). Plan to follow up with GT on 8-27.

## 2014-04-18 NOTE — Progress Notes (Signed)
[facility] [address] [place], [place] [address] [phone]    Cardiology Nuclear Med Study  Corey Jordan is a 80 y.o. male     MRN : [id number]     DOB: Jan 20, 1934  Procedure Date: 04/18/2014  Nuclear Med Background Indication for Stress Test:  Evaluation for Ischemia, Stent Patency and Abnormal EKG History:  CAD, MI, Cath, Stent (LAD), AICD, Echo 2012 EF 30-35%, MPI 2006 (scar) EF 37% Cardiac Risk Factors: Hypertension and Lipids  Symptoms:  Chest Pain (last date of chest discomfort was one month ago)   Nuclear Pre-Procedure Caffeine/Decaff Intake:  None> 12 hrs NPO After: 8:00pm   Lungs:  clear O2 Sat: 97% on room air. IV 0.9% NS with Angio Cath:  22g  IV Site: R Antecubital x 1, tolerated well IV Started by:  Irven Baltimore, RN  Chest Size (in):  40 Cup Size: n/a  Height: 5\' 7"  (1.702 m)  Weight:  150 lb (68.04 kg)  BMI:  Body mass index is 23.49 kg/(m^2). Tech Comments:  Took Coreg and am medications except Lasix.    Nuclear Med Study 1 or 2 day study: 1 day  Stress Test Type:  Carlton Adam  Reading MD: [name]  Order Authorizing Provider:  Kirk Ruths, MD  Resting Radionuclide: Technetium 31m Sestamibi  Resting Radionuclide Dose: 11.0 mCi   Stress Radionuclide:  Technetium 46m Sestamibi  Stress Radionuclide Dose: 33.0 mCi           Stress Protocol Rest HR: 51 Stress HR: 80  Rest BP: 147/75 Stress BP: 124/60  Exercise Time (min): n/a METS: n/a           Dose of Adenosine (mg):  n/a Dose of Lexiscan: 0.4 mg  Dose of Atropine (mg): n/a Dose of Dobutamine: n/a mcg/kg/min (at max HR)  Stress Test Technologist: Glade Lloyd, BS-ES  Nuclear Technologist:  Charlton Amor, CNMT     Rest Procedure:  Myocardial perfusion imaging was performed at rest 45 minutes following the intravenous administration of Technetium 52m Sestamibi. Rest ECG: NSR with non-specific ST-T wave changes  Stress Procedure:  The patient received IV Lexiscan  0.4 mg over 15-seconds.  Technetium 72m Sestamibi injected at 30-seconds.  Quantitative spect images were obtained after a 45 minute delay.  During the infusion of Lexiscan the patient complained of SOB and feeling lightheaded.  These symptoms resolved in recovery.  Stress ECG: No significant change from baseline ECG   QPS Raw Data Images:  Normal; no motion artifact; normal heart/lung ratio. Stress Images:  Decreased uptake in the basal and mid inferoseptal, inferior, inferolateral walls, mid anteroseptal, all apical segments and in the true apex.  Rest Images:  Decreased uptake in the basal and mid inferoseptal, inferior, inferolateral walls, mid anteroseptal, all apical segments and in the true apex.  Subtraction (SDS):  There is a large area, severe severity irreversible defect in the basal and mid inferoseptal, inferior, inferolateral walls, mid anteroseptal, all apical segments and in the true apex with small area, mild reversibility in the basal and mid inferolateral wall. (SDS 4).   Transient Ischemic Dilatation (Normal <1.22):  0.98 Lung/Heart Ratio (Normal <0.45):  0.29  Quantitative Gated Spect Images QGS EDV:  176 ml QGS ESV:  117 ml  Impression Exercise Capacity:  Lexiscan with no exercise. BP Response:  Normal blood pressure response. Clinical Symptoms:  Lightheadedness. ECG Impression:  No significant ST segment change suggestive of ischemia. Comparison with Prior Nuclear Study: tehre is  significant change when compared to the prior study in 2006 that was interpreted as anteroapical scar.   Overall Impression:  High risk stress nuclear study with a large scar in the mid LAD and RCA territory with mild periinfarct ischeia in the basal and LCX/OM territory (SDS 4). If clinically indicated a viability study like cardiac MRI should be considered. .  LV Ejection Fraction: 34%.  LV Wall Motion:  akinesis in the  entire inferior wall and dyskinesis of all the apical segments  including the true apex.   Dorothy Spark 04/18/2014

## 2014-04-27 ENCOUNTER — Other Ambulatory Visit: Payer: Self-pay | Admitting: Cardiology

## 2014-04-30 ENCOUNTER — Other Ambulatory Visit: Payer: Self-pay

## 2014-04-30 MED ORDER — RAMIPRIL 2.5 MG PO CAPS: ORAL_CAPSULE | ORAL | Status: DC

## 2014-06-05 DIAGNOSIS — H6120 Impacted cerumen, unspecified ear: Secondary | ICD-10-CM | POA: Diagnosis not present

## 2014-06-21 ENCOUNTER — Encounter: Payer: Self-pay | Admitting: Gastroenterology

## 2014-06-29 ENCOUNTER — Other Ambulatory Visit: Payer: Self-pay | Admitting: Cardiology

## 2014-06-29 ENCOUNTER — Other Ambulatory Visit: Payer: Self-pay

## 2014-06-29 MED ORDER — OMEPRAZOLE 20 MG PO CPDR: 20.0000 mg | DELAYED_RELEASE_CAPSULE | Freq: Every day | ORAL | Status: DC

## 2014-07-02 ENCOUNTER — Other Ambulatory Visit: Payer: Self-pay

## 2014-07-09 DIAGNOSIS — D239 Other benign neoplasm of skin, unspecified: Secondary | ICD-10-CM | POA: Diagnosis not present

## 2014-07-09 DIAGNOSIS — Z85828 Personal history of other malignant neoplasm of skin: Secondary | ICD-10-CM | POA: Diagnosis not present

## 2014-07-09 DIAGNOSIS — D1801 Hemangioma of skin and subcutaneous tissue: Secondary | ICD-10-CM | POA: Diagnosis not present

## 2014-07-09 DIAGNOSIS — L570 Actinic keratosis: Secondary | ICD-10-CM | POA: Diagnosis not present

## 2014-07-19 ENCOUNTER — Ambulatory Visit (INDEPENDENT_AMBULATORY_CARE_PROVIDER_SITE_OTHER): Payer: Medicare Other | Admitting: Internal Medicine

## 2014-07-19 ENCOUNTER — Encounter: Payer: Self-pay | Admitting: Internal Medicine

## 2014-07-19 VITALS — BP 138/84 | HR 64 | Ht 68.0 in | Wt 154.0 lb

## 2014-07-19 DIAGNOSIS — I10 Essential (primary) hypertension: Secondary | ICD-10-CM | POA: Diagnosis not present

## 2014-07-19 DIAGNOSIS — I2589 Other forms of chronic ischemic heart disease: Secondary | ICD-10-CM

## 2014-07-19 DIAGNOSIS — I209 Angina pectoris, unspecified: Secondary | ICD-10-CM | POA: Diagnosis not present

## 2014-07-19 DIAGNOSIS — I255 Ischemic cardiomyopathy: Secondary | ICD-10-CM

## 2014-07-19 DIAGNOSIS — Z95810 Presence of automatic (implantable) cardiac defibrillator: Secondary | ICD-10-CM | POA: Diagnosis not present

## 2014-07-19 DIAGNOSIS — I2510 Atherosclerotic heart disease of native coronary artery without angina pectoris: Secondary | ICD-10-CM

## 2014-07-19 DIAGNOSIS — I25118 Atherosclerotic heart disease of native coronary artery with other forms of angina pectoris: Secondary | ICD-10-CM

## 2014-07-19 LAB — MDC_IDC_ENUM_SESS_TYPE_INCLINIC
Battery Voltage: 2.65 V
Brady Statistic RV Percent Paced: 0 %
Date Time Interrogation Session: 20150827115754
HighPow Impedance: 42 Ohm
HighPow Impedance: 42 Ohm
HighPow Impedance: 42 Ohm
HighPow Impedance: 43 Ohm
HighPow Impedance: 43 Ohm
HighPow Impedance: 43 Ohm
HighPow Impedance: 43 Ohm
HighPow Impedance: 44 Ohm
HighPow Impedance: 44 Ohm
HighPow Impedance: 44 Ohm
HighPow Impedance: 44 Ohm
HighPow Impedance: 44 Ohm
HighPow Impedance: 46 Ohm
HighPow Impedance: 46 Ohm
HighPow Impedance: 48 Ohm
HighPow Impedance: 50 Ohm
HighPow Impedance: 53 Ohm
HighPow Impedance: 54 Ohm
HighPow Impedance: 54 Ohm
HighPow Impedance: 55 Ohm
HighPow Impedance: 55 Ohm
HighPow Impedance: 55 Ohm
HighPow Impedance: 55 Ohm
HighPow Impedance: 56 Ohm
HighPow Impedance: 56 Ohm
HighPow Impedance: 56 Ohm
HighPow Impedance: 56 Ohm
HighPow Impedance: 57 Ohm
HighPow Impedance: 57 Ohm
HighPow Impedance: 66 Ohm
HighPow Impedance: 69 Ohm
Lead Channel Impedance Value: 528 Ohm
Lead Channel Impedance Value: 536 Ohm
Lead Channel Impedance Value: 544 Ohm
Lead Channel Impedance Value: 544 Ohm
Lead Channel Impedance Value: 544 Ohm
Lead Channel Impedance Value: 552 Ohm
Lead Channel Impedance Value: 560 Ohm
Lead Channel Impedance Value: 560 Ohm
Lead Channel Impedance Value: 560 Ohm
Lead Channel Impedance Value: 560 Ohm
Lead Channel Impedance Value: 568 Ohm
Lead Channel Impedance Value: 568 Ohm
Lead Channel Impedance Value: 576 Ohm
Lead Channel Impedance Value: 608 Ohm
Lead Channel Impedance Value: 648 Ohm
Lead Channel Pacing Threshold Amplitude: 1 V
Lead Channel Pacing Threshold Pulse Width: 0.3 ms
Lead Channel Sensing Intrinsic Amplitude: 16.7 mV
Lead Channel Sensing Intrinsic Amplitude: 16.9 mV
Lead Channel Sensing Intrinsic Amplitude: 17 mV
Lead Channel Sensing Intrinsic Amplitude: 17 mV
Lead Channel Sensing Intrinsic Amplitude: 17 mV
Lead Channel Sensing Intrinsic Amplitude: 17.1 mV
Lead Channel Sensing Intrinsic Amplitude: 17.1 mV
Lead Channel Sensing Intrinsic Amplitude: 17.1 mV
Lead Channel Sensing Intrinsic Amplitude: 17.1 mV
Lead Channel Sensing Intrinsic Amplitude: 17.1 mV
Lead Channel Sensing Intrinsic Amplitude: 17.2 mV
Lead Channel Sensing Intrinsic Amplitude: 17.3 mV
Lead Channel Sensing Intrinsic Amplitude: 17.3 mV
Lead Channel Sensing Intrinsic Amplitude: 17.4 mV
Lead Channel Sensing Intrinsic Amplitude: 17.4 mV
Lead Channel Setting Pacing Amplitude: 2.5 V
Lead Channel Setting Pacing Pulse Width: 0.4 ms
Lead Channel Setting Sensing Sensitivity: 0.3 mV
Zone Setting Detection Interval: 250 ms
Zone Setting Detection Interval: 300 ms
Zone Setting Detection Interval: 350 ms

## 2014-07-19 NOTE — Assessment & Plan Note (Signed)
His blood pressure is fairly well controlled. No change in medical therapy.

## 2014-07-19 NOTE — Assessment & Plan Note (Signed)
His Medtronic ICD is working normally. We'll plan to recheck in several months. 

## 2014-07-19 NOTE — Assessment & Plan Note (Signed)
I've asked the patient to use sublingual nitroglycerin as needed for chest discomfort. If his symptoms recur and worsen, repeat catheterization would be a consideration. Also, long-acting nitroglycerin would be a consideration. He is encouraged to followup with his primary cardiologist if his chest discomfort increases or worsens.

## 2014-07-19 NOTE — Patient Instructions (Signed)
Your physician recommends that you schedule a follow-up appointment in: 3 months with device clinic and 12 months with Dr Lovena Le

## 2014-07-19 NOTE — Progress Notes (Signed)
HPI [name] returns today for followup. He is a very pleasant 80 year old man with an ischemic cardiomyopathy, chronic systolic heart failure, status post ICD implantation. He is active, walking 5 days a week for approximately 25 minutes at a time. Overall he feels well but does note occasional episodes of dyspnea. No syncope. He denies peripheral edema. He has had no recent ICD shocks. He denies dietary or medical indiscretion or noncompliance. He notes that he has had occasional episodes of exertional chest pressure. This is very mild, compared to prior coronary events. He takes nitroglycerin and his symptoms subside. He recently had a stress test. Allergies  Allergen Reactions  . Pollen Extract Other (See Comments)    Sneezing and watery eyes     Current Outpatient Prescriptions  Medication Sig Dispense Refill  . aspirin 81 MG EC tablet Take 81 mg by mouth daily.        Marland Kitchen atorvastatin (LIPITOR) 40 MG tablet TAKE 1 TABLET DAILY  90 tablet  1  . calcium carbonate (OS-CAL) 600 MG TABS Take 600 mg by mouth 2 (two) times daily with a meal.        . carvedilol (COREG) 6.25 MG tablet TAKE 1 TABLET TWICE A DAY  WITH MEALS  180 tablet  0  . cholecalciferol (VITAMIN D) 1000 UNITS tablet Take 2,000 Units by mouth daily.        . furosemide (LASIX) 20 MG tablet Take 20 mg by mouth as needed for fluid.       Marland Kitchen levothyroxine (SYNTHROID, LEVOTHROID) 50 MCG tablet Take 1 tablet (50 mcg total) by mouth every morning.  90 tablet  3  . nitroGLYCERIN (NITROSTAT) 0.4 MG SL tablet Place 1 tablet (0.4 mg total) under the tongue every 5 (five) minutes as needed. For chest pain  25 tablet  6  . omeprazole (PRILOSEC) 20 MG capsule Take 1 capsule (20 mg total) by mouth daily.  90 capsule  3  . ramipril (ALTACE) 2.5 MG capsule TAKE 1 CAPSULE EVERY       MORNING  90 capsule  3  . spironolactone (ALDACTONE) 25 MG tablet TAKE 1/2 TABLET (=12.5MG )  DAILY  45 tablet  1   No current facility-administered medications  for this visit.     Past Medical History  Diagnosis Date  . 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  . Cardiac defibrillator in situ     s/p MDT Maximo VR single lead ICD - 07/06/2006  . Hyperlipidemia   . Hypertension   . Ischemic cardiomyopathy     EF 40% 12/2008 V gram;  Echocardiogram 11/08/11: EF 30-35%, inferoseptal and anteroseptal, apical septal/lateral/anterior/inferior and true apical akinesis, no LV thrombus, grade 1 diastolic dysfunction.  Marland Kitchen History of orthostatic hypotension   . Degenerative joint disease   . Hypothyroidism   . Premature atrial contractions   . Premature ventricular contractions   . Systolic CHF, chronic   . Lumbar spondylosis     s/p L5-S1 fusion 12/11/2009  . Skin cancer     skin  . H/O hiatal hernia     times 3  . Spondylosis     ROS:   All systems reviewed  and negative except as noted in the HPI.   Past Surgical History  Procedure Laterality Date  . Icd      s/p MDT Maximo VR single lead ICD - 07/06/2006  . Lumbar fusion      s/p L5-S1 fusion 12/11/2009  . Back surgery       Family History  Problem Relation Age of Onset  . Pneumonia Mother     deceased 54  . Peripheral vascular disease Father     deceased 34     History   Social History  . Marital Status: Married    Spouse Name: [name]    Number of Children: [name]  . Years of Education: [name]   Occupational History  . retired [name]   Social History Main Topics  . Smoking status: Never Smoker   . Smokeless tobacco: Not on file  . Alcohol Use: No  . Drug Use: No  . Sexual Activity: No   Other Topics Concern  . Not on file   Social History Narrative   Active, walks regularly.      BP 138/84  Pulse 64  Ht 5\' 8"  (1.727 m)  Wt 154 lb (69.854 kg)  BMI 23.42 kg/m2  Physical Exam:  Well appearing 80 year old man, NAD HEENT: Unremarkable Neck:  7 cm JVD, no thyromegally Back:  No CVA tenderness Lungs:  Clear with no wheezes, rales, or rhonchi. HEART:  Regular rate rhythm, no murmurs, no rubs, no clicks Abd:  soft, positive bowel sounds, no organomegally, no rebound, no guarding Ext:  2 plus pulses, no edema, no cyanosis, no clubbing Skin:  No rashes no nodules Neuro:  CN II through XII intact, motor grossly intact   DEVICE  Normal device function.  See PaceArt for details.   Assess/Plan:

## 2014-07-29 ENCOUNTER — Encounter: Payer: Self-pay | Admitting: Internal Medicine

## 2014-07-29 NOTE — Patient Instructions (Signed)
Continue same medications and return in 6 months 

## 2014-07-31 ENCOUNTER — Other Ambulatory Visit: Payer: Self-pay | Admitting: Cardiology

## 2014-07-31 ENCOUNTER — Encounter: Payer: Self-pay | Admitting: Internal Medicine

## 2014-08-29 ENCOUNTER — Other Ambulatory Visit: Payer: Self-pay | Admitting: Internal Medicine

## 2014-09-05 ENCOUNTER — Ambulatory Visit (INDEPENDENT_AMBULATORY_CARE_PROVIDER_SITE_OTHER): Payer: Medicare Other | Admitting: Internal Medicine

## 2014-09-05 DIAGNOSIS — Z23 Encounter for immunization: Secondary | ICD-10-CM

## 2014-10-09 ENCOUNTER — Other Ambulatory Visit: Payer: Medicare Other | Admitting: Internal Medicine

## 2014-10-09 DIAGNOSIS — I10 Essential (primary) hypertension: Secondary | ICD-10-CM

## 2014-10-09 DIAGNOSIS — E785 Hyperlipidemia, unspecified: Secondary | ICD-10-CM

## 2014-10-09 DIAGNOSIS — E039 Hypothyroidism, unspecified: Secondary | ICD-10-CM | POA: Diagnosis not present

## 2014-10-09 DIAGNOSIS — I2510 Atherosclerotic heart disease of native coronary artery without angina pectoris: Secondary | ICD-10-CM | POA: Diagnosis not present

## 2014-10-10 LAB — HEPATIC FUNCTION PANEL
ALT: 16 U/L (ref 0–53)
AST: 20 U/L (ref 0–37)
Albumin: 3.9 g/dL (ref 3.5–5.2)
Alkaline Phosphatase: 75 U/L (ref 39–117)
BILIRUBIN DIRECT: 0.2 mg/dL (ref 0.0–0.3)
BILIRUBIN INDIRECT: 0.5 mg/dL (ref 0.2–1.2)
BILIRUBIN TOTAL: 0.7 mg/dL (ref 0.2–1.2)
Total Protein: 6 g/dL (ref 6.0–8.3)

## 2014-10-10 LAB — TSH: TSH: 1.794 u[IU]/mL (ref 0.350–4.500)

## 2014-10-10 LAB — LIPID PANEL
CHOL/HDL RATIO: 2.1 ratio
Cholesterol: 137 mg/dL (ref 0–200)
HDL: 64 mg/dL (ref >39)
LDL CALC: 66 mg/dL (ref 0–99)
Triglycerides: 36 mg/dL (ref <150)
VLDL: 7 mg/dL (ref 0–40)

## 2014-10-10 LAB — BASIC METABOLIC PANEL
BUN: 12 mg/dL (ref 6–23)
CO2: 27 meq/L (ref 19–32)
Calcium: 9.5 mg/dL (ref 8.4–10.5)
Chloride: 103 mEq/L (ref 96–112)
Creat: 0.93 mg/dL (ref 0.50–1.35)
GLUCOSE: 95 mg/dL (ref 70–99)
POTASSIUM: 4.5 meq/L (ref 3.5–5.3)
SODIUM: 140 meq/L (ref 135–145)

## 2014-10-11 ENCOUNTER — Encounter: Payer: Self-pay | Admitting: Internal Medicine

## 2014-10-11 ENCOUNTER — Ambulatory Visit (INDEPENDENT_AMBULATORY_CARE_PROVIDER_SITE_OTHER): Payer: Medicare Other | Admitting: Internal Medicine

## 2014-10-11 VITALS — BP 160/80 | HR 58 | Temp 97.0°F | Ht 68.0 in | Wt 156.0 lb

## 2014-10-11 DIAGNOSIS — E785 Hyperlipidemia, unspecified: Secondary | ICD-10-CM

## 2014-10-11 DIAGNOSIS — I259 Chronic ischemic heart disease, unspecified: Secondary | ICD-10-CM | POA: Diagnosis not present

## 2014-10-11 DIAGNOSIS — Z8679 Personal history of other diseases of the circulatory system: Secondary | ICD-10-CM | POA: Diagnosis not present

## 2014-10-11 DIAGNOSIS — F439 Reaction to severe stress, unspecified: Secondary | ICD-10-CM

## 2014-10-11 DIAGNOSIS — I10 Essential (primary) hypertension: Secondary | ICD-10-CM | POA: Diagnosis not present

## 2014-10-11 DIAGNOSIS — E039 Hypothyroidism, unspecified: Secondary | ICD-10-CM | POA: Diagnosis not present

## 2014-10-11 DIAGNOSIS — I255 Ischemic cardiomyopathy: Secondary | ICD-10-CM | POA: Diagnosis not present

## 2014-10-11 DIAGNOSIS — Z658 Other specified problems related to psychosocial circumstances: Secondary | ICD-10-CM

## 2014-10-17 ENCOUNTER — Telehealth: Payer: Self-pay | Admitting: Internal Medicine

## 2014-10-17 NOTE — Telephone Encounter (Signed)
Patient is calling in with BP readings for the last 4 days.  These readings are all taken about 8 hours after taking his medications.  He takes his medications in the a.m. And he took his readings in the eveing.  They are as follows:  11/20:  114/64 11/22:  111/65 11/23:  117/68 11/24:  118/67  He uses a battery operated arm cuff.

## 2014-10-17 NOTE — Telephone Encounter (Signed)
Informed patient of good BP, no medication change at this time.

## 2014-10-17 NOTE — Telephone Encounter (Signed)
Please call pt. BP readings excellent. No change in medications at this time.

## 2014-10-22 ENCOUNTER — Ambulatory Visit (INDEPENDENT_AMBULATORY_CARE_PROVIDER_SITE_OTHER): Payer: Medicare Other | Admitting: *Deleted

## 2014-10-22 DIAGNOSIS — I493 Ventricular premature depolarization: Secondary | ICD-10-CM

## 2014-10-22 DIAGNOSIS — I255 Ischemic cardiomyopathy: Secondary | ICD-10-CM | POA: Diagnosis not present

## 2014-10-22 DIAGNOSIS — I5022 Chronic systolic (congestive) heart failure: Secondary | ICD-10-CM | POA: Diagnosis not present

## 2014-10-22 DIAGNOSIS — Z95810 Presence of automatic (implantable) cardiac defibrillator: Secondary | ICD-10-CM

## 2014-10-22 DIAGNOSIS — I491 Atrial premature depolarization: Secondary | ICD-10-CM | POA: Diagnosis not present

## 2014-10-22 LAB — MDC_IDC_ENUM_SESS_TYPE_INCLINIC
Battery Voltage: 2.64 V
Brady Statistic RV Percent Paced: 0.3 %
Lead Channel Pacing Threshold Amplitude: 1 V
Lead Channel Setting Sensing Sensitivity: 0.3 mV
MDC IDC MSMT LEADCHNL RV IMPEDANCE VALUE: 552 Ohm
MDC IDC MSMT LEADCHNL RV PACING THRESHOLD PULSEWIDTH: 0.3 ms
MDC IDC MSMT LEADCHNL RV SENSING INTR AMPL: 17.3 mV
MDC IDC SET LEADCHNL RV PACING AMPLITUDE: 2.5 V
MDC IDC SET LEADCHNL RV PACING PULSEWIDTH: 0.4 ms
MDC IDC SET ZONE DETECTION INTERVAL: 250 ms
Zone Setting Detection Interval: 300 ms
Zone Setting Detection Interval: 350 ms

## 2014-10-22 NOTE — Progress Notes (Signed)
Pt seen in clinic for follow up of ICD.  No complaints of chest pain, shortness of breath, dizziness, palpitations, or shocks.  Device functioning normally at this time.  For full details, see PaceArt report.  No programming changes made today.  Plan to follow up in 3 months with device clinic.  Ranee Gosselin, RN, BSN 10/22/2014 12:03 PM

## 2014-10-31 ENCOUNTER — Encounter (HOSPITAL_COMMUNITY): Payer: Self-pay | Admitting: Cardiovascular Disease

## 2014-11-09 ENCOUNTER — Encounter: Payer: Self-pay | Admitting: Internal Medicine

## 2014-12-11 DIAGNOSIS — H6122 Impacted cerumen, left ear: Secondary | ICD-10-CM | POA: Diagnosis not present

## 2014-12-29 ENCOUNTER — Other Ambulatory Visit: Payer: Self-pay | Admitting: Cardiology

## 2015-01-09 DIAGNOSIS — L570 Actinic keratosis: Secondary | ICD-10-CM | POA: Diagnosis not present

## 2015-01-09 DIAGNOSIS — L821 Other seborrheic keratosis: Secondary | ICD-10-CM | POA: Diagnosis not present

## 2015-01-09 DIAGNOSIS — Z85828 Personal history of other malignant neoplasm of skin: Secondary | ICD-10-CM | POA: Diagnosis not present

## 2015-01-09 DIAGNOSIS — D220 Melanocytic nevi of lip: Secondary | ICD-10-CM | POA: Diagnosis not present

## 2015-01-09 DIAGNOSIS — D2239 Melanocytic nevi of other parts of face: Secondary | ICD-10-CM | POA: Diagnosis not present

## 2015-01-09 DIAGNOSIS — D1801 Hemangioma of skin and subcutaneous tissue: Secondary | ICD-10-CM | POA: Diagnosis not present

## 2015-01-09 DIAGNOSIS — L400 Psoriasis vulgaris: Secondary | ICD-10-CM | POA: Diagnosis not present

## 2015-01-19 NOTE — Progress Notes (Signed)
   Subjective:    Patient ID: Corey Jordan, male    DOB: Jul 26, 1934, 81 y.o.   MRN: [id number]  HPI Patient with history of coronary artery disease in today for six-month recheck. History of hyperlipidemia, hypothyroidism, hypertension. Lipid panel, liver functions and TSH are all within normal limits as is basic metabolic panel. Admits to being a little bit depressed recently. Doesn't want to be on medication. We talked about it today. Wife is giving son money.    Review of Systems     Objective:   Physical Exam  Skin warm and dry. Nodes none. Neck supple without JVD thyromegaly or carotid bruits. Chest clear. Cardiac exam regular rate and rhythm normal S1 and S2. Extremities without edema.      Assessment & Plan:  Situational stress  Coronary artery disease  Hyperlipidemia  Hypothyroidism  Hypertension  Plan: Medical issues appear to be stable. Return in 6 months or as needed.  25 minutes spent with patient

## 2015-01-19 NOTE — Patient Instructions (Signed)
Continue same medications and return for physical exam in 6 months 

## 2015-01-23 ENCOUNTER — Encounter: Payer: Self-pay | Admitting: Gastroenterology

## 2015-01-24 ENCOUNTER — Ambulatory Visit (INDEPENDENT_AMBULATORY_CARE_PROVIDER_SITE_OTHER): Payer: Medicare Other | Admitting: *Deleted

## 2015-01-24 DIAGNOSIS — I255 Ischemic cardiomyopathy: Secondary | ICD-10-CM | POA: Diagnosis not present

## 2015-01-24 LAB — MDC_IDC_ENUM_SESS_TYPE_INCLINIC
Battery Voltage: 2.64 V
HIGH POWER IMPEDANCE MEASURED VALUE: 42 Ohm
HIGH POWER IMPEDANCE MEASURED VALUE: 43 Ohm
HIGH POWER IMPEDANCE MEASURED VALUE: 43 Ohm
HIGH POWER IMPEDANCE MEASURED VALUE: 43 Ohm
HIGH POWER IMPEDANCE MEASURED VALUE: 46 Ohm
HIGH POWER IMPEDANCE MEASURED VALUE: 46 Ohm
HIGH POWER IMPEDANCE MEASURED VALUE: 49 Ohm
HIGH POWER IMPEDANCE MEASURED VALUE: 54 Ohm
HIGH POWER IMPEDANCE MEASURED VALUE: 55 Ohm
HIGH POWER IMPEDANCE MEASURED VALUE: 55 Ohm
HIGH POWER IMPEDANCE MEASURED VALUE: 56 Ohm
HIGH POWER IMPEDANCE MEASURED VALUE: 59 Ohm
HighPow Impedance: 41 Ohm
HighPow Impedance: 42 Ohm
HighPow Impedance: 42 Ohm
HighPow Impedance: 42 Ohm
HighPow Impedance: 43 Ohm
HighPow Impedance: 43 Ohm
HighPow Impedance: 43 Ohm
HighPow Impedance: 44 Ohm
HighPow Impedance: 44 Ohm
HighPow Impedance: 54 Ohm
HighPow Impedance: 54 Ohm
HighPow Impedance: 54 Ohm
HighPow Impedance: 54 Ohm
HighPow Impedance: 56 Ohm
HighPow Impedance: 56 Ohm
HighPow Impedance: 56 Ohm
HighPow Impedance: 57 Ohm
HighPow Impedance: 62 Ohm
HighPow Impedance: 63 Ohm
Lead Channel Impedance Value: 512 Ohm
Lead Channel Impedance Value: 512 Ohm
Lead Channel Impedance Value: 520 Ohm
Lead Channel Impedance Value: 520 Ohm
Lead Channel Impedance Value: 520 Ohm
Lead Channel Impedance Value: 528 Ohm
Lead Channel Impedance Value: 528 Ohm
Lead Channel Impedance Value: 528 Ohm
Lead Channel Impedance Value: 528 Ohm
Lead Channel Impedance Value: 552 Ohm
Lead Channel Impedance Value: 576 Ohm
Lead Channel Pacing Threshold Amplitude: 1 V
Lead Channel Pacing Threshold Pulse Width: 0.2 ms
Lead Channel Sensing Intrinsic Amplitude: 17 mV
Lead Channel Sensing Intrinsic Amplitude: 17 mV
Lead Channel Sensing Intrinsic Amplitude: 17 mV
Lead Channel Sensing Intrinsic Amplitude: 17 mV
Lead Channel Sensing Intrinsic Amplitude: 17.1 mV
Lead Channel Sensing Intrinsic Amplitude: 17.2 mV
Lead Channel Sensing Intrinsic Amplitude: 17.3 mV
Lead Channel Sensing Intrinsic Amplitude: 17.3 mV
Lead Channel Setting Pacing Amplitude: 2.5 V
Lead Channel Setting Pacing Pulse Width: 0.4 ms
Lead Channel Setting Sensing Sensitivity: 0.3 mV
MDC IDC MSMT LEADCHNL RV IMPEDANCE VALUE: 520 Ohm
MDC IDC MSMT LEADCHNL RV IMPEDANCE VALUE: 528 Ohm
MDC IDC MSMT LEADCHNL RV IMPEDANCE VALUE: 536 Ohm
MDC IDC MSMT LEADCHNL RV IMPEDANCE VALUE: 536 Ohm
MDC IDC MSMT LEADCHNL RV SENSING INTR AMPL: 16.9 mV
MDC IDC MSMT LEADCHNL RV SENSING INTR AMPL: 16.9 mV
MDC IDC MSMT LEADCHNL RV SENSING INTR AMPL: 17 mV
MDC IDC MSMT LEADCHNL RV SENSING INTR AMPL: 17.1 mV
MDC IDC MSMT LEADCHNL RV SENSING INTR AMPL: 17.1 mV
MDC IDC MSMT LEADCHNL RV SENSING INTR AMPL: 17.3 mV
MDC IDC MSMT LEADCHNL RV SENSING INTR AMPL: 17.4 mV
MDC IDC SESS DTM: 20160303145345
MDC IDC SET ZONE DETECTION INTERVAL: 350 ms
MDC IDC STAT BRADY RV PERCENT PACED: 0 %
Zone Setting Detection Interval: 250 ms
Zone Setting Detection Interval: 300 ms

## 2015-01-24 NOTE — Progress Notes (Signed)
ICD check in clinic. Normal device function. Thresholds and sensing consistent with previous device measurements. Impedance trends stable over time. No evidence of any ventricular arrhythmias.  Histogram distribution appropriate for patient and level of activity. No changes made this session. Device programmed at appropriate safety margins. Device programmed to optimize intrinsic conduction. Patient education completed including shock plan. Alert tones/vibration demonstrated for patient.  ROV in May with the [facility] for battery longevity only.

## 2015-01-26 ENCOUNTER — Other Ambulatory Visit: Payer: Self-pay | Admitting: Cardiology

## 2015-02-12 ENCOUNTER — Encounter: Payer: Self-pay | Admitting: Internal Medicine

## 2015-02-20 ENCOUNTER — Ambulatory Visit (INDEPENDENT_AMBULATORY_CARE_PROVIDER_SITE_OTHER): Payer: Medicare Other | Admitting: *Deleted

## 2015-02-20 DIAGNOSIS — Z95810 Presence of automatic (implantable) cardiac defibrillator: Secondary | ICD-10-CM

## 2015-02-20 LAB — MDC_IDC_ENUM_SESS_TYPE_INCLINIC

## 2015-02-20 NOTE — Progress Notes (Signed)
ICD alert x 3 days- patient to office today. Battery not at [facility]- 2.64V (patient alert triggered- battery was 2.62V). Patient to follow up in device clinic 03/27/15. Patient instructed to call device clinic if alert heard again.

## 2015-02-22 ENCOUNTER — Ambulatory Visit (INDEPENDENT_AMBULATORY_CARE_PROVIDER_SITE_OTHER): Payer: Medicare Other | Admitting: *Deleted

## 2015-02-22 DIAGNOSIS — Z95810 Presence of automatic (implantable) cardiac defibrillator: Secondary | ICD-10-CM

## 2015-02-22 LAB — MDC_IDC_ENUM_SESS_TYPE_INCLINIC

## 2015-02-22 NOTE — Progress Notes (Signed)
ICD beeping- patient ERI alert turned off due to ERI reset. Follow up with GT 03/27/15. Patient instructed to call if he has symptoms or concerns.

## 2015-02-28 ENCOUNTER — Other Ambulatory Visit: Payer: Self-pay | Admitting: Internal Medicine

## 2015-02-28 NOTE — Telephone Encounter (Signed)
Evans Lance, MD at 07/19/2014 6:54 PM  atorvastatin (LIPITOR) 40 MG tablet TAKE 1 TABLET DAILY Patient Instructions     Your physician recommends that you schedule a follow-up appointment in: 3 months with device clinic and 12 months with Dr Lovena Le

## 2015-03-21 ENCOUNTER — Encounter: Payer: Self-pay | Admitting: Internal Medicine

## 2015-03-26 ENCOUNTER — Other Ambulatory Visit: Payer: Self-pay | Admitting: Cardiology

## 2015-03-27 ENCOUNTER — Ambulatory Visit (INDEPENDENT_AMBULATORY_CARE_PROVIDER_SITE_OTHER): Payer: Medicare Other | Admitting: *Deleted

## 2015-03-27 DIAGNOSIS — I255 Ischemic cardiomyopathy: Secondary | ICD-10-CM

## 2015-03-27 DIAGNOSIS — I5022 Chronic systolic (congestive) heart failure: Secondary | ICD-10-CM

## 2015-03-27 LAB — CUP PACEART INCLINIC DEVICE CHECK
Battery Voltage: 2.62 V
Brady Statistic RV Percent Paced: 0 %
Date Time Interrogation Session: 20160504152519
HighPow Impedance: 42 Ohm
HighPow Impedance: 43 Ohm
HighPow Impedance: 43 Ohm
HighPow Impedance: 43 Ohm
HighPow Impedance: 44 Ohm
HighPow Impedance: 44 Ohm
HighPow Impedance: 44 Ohm
HighPow Impedance: 44 Ohm
HighPow Impedance: 45 Ohm
HighPow Impedance: 45 Ohm
HighPow Impedance: 45 Ohm
HighPow Impedance: 45 Ohm
HighPow Impedance: 45 Ohm
HighPow Impedance: 46 Ohm
HighPow Impedance: 46 Ohm
HighPow Impedance: 50 Ohm
HighPow Impedance: 54 Ohm
HighPow Impedance: 55 Ohm
HighPow Impedance: 56 Ohm
HighPow Impedance: 56 Ohm
HighPow Impedance: 56 Ohm
HighPow Impedance: 56 Ohm
HighPow Impedance: 56 Ohm
HighPow Impedance: 56 Ohm
HighPow Impedance: 56 Ohm
HighPow Impedance: 56 Ohm
HighPow Impedance: 57 Ohm
HighPow Impedance: 57 Ohm
HighPow Impedance: 57 Ohm
HighPow Impedance: 58 Ohm
HighPow Impedance: 67 Ohm
Lead Channel Impedance Value: 520 Ohm
Lead Channel Impedance Value: 520 Ohm
Lead Channel Impedance Value: 520 Ohm
Lead Channel Impedance Value: 520 Ohm
Lead Channel Impedance Value: 520 Ohm
Lead Channel Impedance Value: 520 Ohm
Lead Channel Impedance Value: 528 Ohm
Lead Channel Impedance Value: 528 Ohm
Lead Channel Impedance Value: 528 Ohm
Lead Channel Impedance Value: 528 Ohm
Lead Channel Impedance Value: 528 Ohm
Lead Channel Impedance Value: 536 Ohm
Lead Channel Impedance Value: 544 Ohm
Lead Channel Impedance Value: 544 Ohm
Lead Channel Impedance Value: 552 Ohm
Lead Channel Sensing Intrinsic Amplitude: 17 mV
Lead Channel Sensing Intrinsic Amplitude: 17.1 mV
Lead Channel Sensing Intrinsic Amplitude: 17.2 mV
Lead Channel Sensing Intrinsic Amplitude: 17.2 mV
Lead Channel Sensing Intrinsic Amplitude: 17.2 mV
Lead Channel Sensing Intrinsic Amplitude: 17.2 mV
Lead Channel Sensing Intrinsic Amplitude: 17.2 mV
Lead Channel Sensing Intrinsic Amplitude: 17.2 mV
Lead Channel Sensing Intrinsic Amplitude: 17.2 mV
Lead Channel Sensing Intrinsic Amplitude: 17.2 mV
Lead Channel Sensing Intrinsic Amplitude: 17.4 mV
Lead Channel Sensing Intrinsic Amplitude: 17.4 mV
Lead Channel Sensing Intrinsic Amplitude: 17.4 mV
Lead Channel Sensing Intrinsic Amplitude: 17.4 mV
Lead Channel Sensing Intrinsic Amplitude: 17.8 mV
Lead Channel Setting Pacing Amplitude: 2.5 V
Lead Channel Setting Pacing Pulse Width: 0.4 ms
Lead Channel Setting Sensing Sensitivity: 0.3 mV
Zone Setting Detection Interval: 250 ms
Zone Setting Detection Interval: 300 ms
Zone Setting Detection Interval: 350 ms

## 2015-03-27 NOTE — Progress Notes (Signed)
ICD check in clinic for batt only (N/C). ERI reached on 4/27. No ventricular arrhythmias recorded. Patient to follow up with Chanetta Marshall, NP on 5/18 @ 1445. Audible alert for ERI was previously disabled.

## 2015-04-10 ENCOUNTER — Encounter: Payer: Self-pay | Admitting: *Deleted

## 2015-04-10 ENCOUNTER — Encounter: Payer: Self-pay | Admitting: Nurse Practitioner

## 2015-04-10 ENCOUNTER — Ambulatory Visit (INDEPENDENT_AMBULATORY_CARE_PROVIDER_SITE_OTHER): Payer: Medicare Other | Admitting: Nurse Practitioner

## 2015-04-10 VITALS — BP 160/70 | HR 69 | Ht 68.0 in | Wt 156.8 lb

## 2015-04-10 DIAGNOSIS — I10 Essential (primary) hypertension: Secondary | ICD-10-CM | POA: Diagnosis not present

## 2015-04-10 DIAGNOSIS — I255 Ischemic cardiomyopathy: Secondary | ICD-10-CM | POA: Diagnosis not present

## 2015-04-10 DIAGNOSIS — I5022 Chronic systolic (congestive) heart failure: Secondary | ICD-10-CM

## 2015-04-10 MED ORDER — FUROSEMIDE 20 MG PO TABS: 20.0000 mg | ORAL_TABLET | ORAL | Status: DC | PRN

## 2015-04-10 NOTE — Progress Notes (Signed)
Electrophysiology Office Note Date: 04/10/2015  ID:  Corey Jordan, DOB [birth year], MRN [id number]  PCP: Elby Showers, MD Primary Cardiologist: Stanford Breed Electrophysiologist: Lovena Le  CC: ICD at [place] is a 81 y.o. male is seen today for Dr Lovena Le.  His ICD was recently found to be at [facility] and he presents today for further evaluation. Since last being seen in our clinic, the patient reports doing very well. He denies chest pain, palpitations, dyspnea, PND, orthopnea, nausea, vomiting, dizziness, syncope, edema, weight gain, or early satiety.  He has not had ICD shocks.   Device History: MDT singe chamber ICD implanted 2007 for ischemic cardiomyopathy History of appropriate therapy: No History of AAD therapy: No   Past Medical History  Diagnosis Date  . 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  . Cardiac defibrillator in situ     s/p MDT Maximo VR single lead ICD - 07/06/2006  . Hyperlipidemia   . Hypertension   . Ischemic cardiomyopathy     EF 40% 12/2008 V gram;  Echocardiogram 11/08/11: EF 30-35%, inferoseptal and anteroseptal, apical septal/lateral/anterior/inferior and true apical akinesis, no LV thrombus, grade 1 diastolic dysfunction.  Marland Kitchen History of orthostatic hypotension   . Degenerative joint disease   . Hypothyroidism   . Premature atrial contractions   . Premature ventricular contractions   . Systolic CHF, chronic   . Lumbar spondylosis     s/p L5-S1 fusion 12/11/2009  . Skin cancer     skin  . H/O hiatal hernia     times 3   Past Surgical History  Procedure Laterality Date  . Icd      s/p MDT Maximo VR single lead ICD - 07/06/2006  . Lumbar fusion      s/p  L5-S1 fusion 12/11/2009  . Back surgery    . Left heart catheterization with coronary angiogram [name] 11/09/2011    Procedure: LEFT HEART CATHETERIZATION WITH CORONARY ANGIOGRAM;  Surgeon: Josue Hector, MD;  Location: [facility] CATH LAB;  Service: Cardiovascular;  Laterality: [name];    Current Outpatient Prescriptions  Medication Sig Dispense Refill  . aspirin 81 MG EC tablet Take 81 mg by mouth daily.      Marland Kitchen atorvastatin (LIPITOR) 40 MG tablet TAKE 1 TABLET DAILY 90 tablet 1  . calcium carbonate (OS-CAL) 600 MG TABS Take 600 mg by mouth 2 (two) times daily with a meal.      . carvedilol (COREG) 6.25 MG tablet TAKE 1 TABLET TWICE A DAY  WITH MEALS 180 tablet 1  . cholecalciferol (VITAMIN D) 1000 UNITS tablet Take 2,000 Units by mouth daily.      . furosemide (LASIX) 20 MG tablet Take 20 mg by mouth as needed for fluid.     Marland Kitchen levothyroxine (SYNTHROID, LEVOTHROID) 50 MCG tablet Take 1 tablet (50 mcg total) by mouth every morning. 90 tablet 3  . nitroGLYCERIN (NITROSTAT) 0.4 MG SL tablet Place 1 tablet (0.4 mg total) under the tongue every 5 (five) minutes as needed. For chest pain 25 tablet 6  . omeprazole (PRILOSEC) 20 MG capsule Take 1 capsule (20 mg total) by mouth daily. (Patient taking differently: Take 20  mg by mouth as needed. ) 90 capsule 3  . ramipril (ALTACE) 2.5 MG capsule TAKE 1 CAPSULE EVERY       MORNING 90 capsule 3  . spironolactone (ALDACTONE) 25 MG tablet TAKE 1/2 TABLET (=12.5MG )  DAILY 45 tablet 0   No current facility-administered medications for this visit.    Allergies:   Pollen extract   Social History: History   Social History  . Marital Status: Married    Spouse Name: [name]  . Number of Children: [name]  . Years of Education: [name]   Occupational History  . retired [name]   Social History Main Topics  . Smoking status: Never Smoker   . Smokeless tobacco: Not on file  . Alcohol Use: No  . Drug Use: No  . Sexual Activity: No   Other Topics Concern  . Not on  file   Social History Narrative   Active, walks regularly.    Family History: Family History  Problem Relation Age of Onset  . Pneumonia Mother     deceased 85  . Peripheral vascular disease Father     deceased 9    Review of Systems: All other systems reviewed and are otherwise negative except as noted above.   Physical Exam: VS:  There were no vitals taken for this visit. , BMI There is no weight on file to calculate BMI.  GEN- The patient is elderly appearing, alert and oriented x 3 today.   HEENT: normocephalic, atraumatic; sclera clear, conjunctiva pink; hearing intact; oropharynx clear; neck supple, no JVP Lymph- no cervical lymphadenopathy Lungs- Clear to ausculation bilaterally, normal work of breathing.  No wheezes, rales, rhonchi Heart- Regular rate and rhythm, no murmurs, rubs or gallops GI- soft, non-tender, non-distended, bowel sounds present, no hepatosplenomegaly Extremities- no clubbing, cyanosis, or edema; DP/PT/radial pulses 2+ bilaterally MS- no significant deformity or atrophy Skin- warm and dry, no rash or lesion; ICD pocket well healed, device very superficial Psych- euthymic mood, full affect Neuro- strength and sensation are intact  ICD interrogation- reviewed in detail today,  See PACEART report  EKG:  EKG is ordered today. The ekg ordered today shows sinus rhythm, rate 69, poor R wave progression, normal intervals  Recent Labs: 10/09/2014: ALT 16; BUN 12; Creatinine 0.93; Potassium 4.5; Sodium 140; TSH 1.794   Wt Readings from Last 3 Encounters:  10/11/14 156 lb (70.761 kg)  07/19/14 154 lb (69.854 kg)  04/18/14 150 lb (68.04 kg)     Other studies Reviewed: Additional studies/ records that were reviewed today include: Dr Lovena Le and Dr Jacalyn Lefevre office notes, myoview  Assessment and Plan:  1.  Chronic systolic dysfunction euvolemic today Stable on an appropriate medical regimen His ICD has reached ERI.  He has never had appropriate  therapy.  Myoview last year demonstrated persistently depressed EF.  Risks, benefits to ICD generator change were reviewed with the patient who wishes to proceed.  Will schedule with Dr Lovena Le next available time.  His device is relatively superficial due to lack of adipose tissue - Dr Lovena Le examined patient today - will plan to place device sub-pec.   2.  CAD/ICM No recent ischemic symptoms The patient remains very active Continue medical therapy Myoview 03/2014 with mild per-infarct ischemia, EF 34%  3.  HTN Elevated in the office today.  The patient reports that he checks his blood pressure at home and systolic is 751-700'F No change today  Current medicines are reviewed at length with the patient today.  The patient does not have concerns regarding his medicines.  The following changes were made today:  none  Labs/ tests ordered today include: none  He is having physical labs with Dr Renold Genta next week - we will use these as pre-procedure labs.   Disposition:   Follow up with Dr Lovena Le after generator change. Follow up with Dr Stanford Breed as scheduled.    Signed, Chanetta Marshall, NP 04/10/2015 2:09 PM  [place] [address] [phone] (office) [phone] (fax)

## 2015-04-10 NOTE — Patient Instructions (Addendum)
Medication Instructions:   Your physician recommends that you continue on your current medications as directed. Please refer to the Current Medication list given to you today.  Labwork:   Testing/Procedures:   Follow-Up:  YOUR ARE SET UP FOR A DEVICE CHANGES ON 04/25/15 @ 7 AM   Any Other Special Instructions Will Be Listed Below (If Applicable).

## 2015-04-15 NOTE — Progress Notes (Signed)
HPI: FU coronary artery disease. Abdominal CT in 2005 showed no aneurysm. Patient had a myocardial infarction in 2004. He had PCI of his LAD. His ejection fraction was 30%. Last catheterization in 2007 showed patent stents in the LAD. There is an 80% ostial first diagonal, occluded second diagonal and normal circumflex and right coronary artery. Ejection fraction was 30%. Patient had an ICD placed. Last echocardiogram in December 2012 showed an ejection fraction of 30-35%. There was grade 1 diastolic dysfunction. There was mild left atrial enlargement. Nuclear study May 2015 showed ejection fraction 34%. There was scar with mild peri-infarct ischemia. He has been treated medically. Since he was last seen, he denies dyspnea, palpitations, syncope or pedal edema. He occasionally has brief chest pain that he has had intermittently since his infarct.  Current Outpatient Prescriptions  Medication Sig Dispense Refill  . aspirin 81 MG EC tablet Take 81 mg by mouth daily.      Marland Kitchen atorvastatin (LIPITOR) 40 MG tablet Take 40 mg by mouth daily.    . calcium carbonate (OS-CAL) 600 MG TABS Take 600 mg by mouth 2 (two) times daily with a meal.      . carvedilol (COREG) 6.25 MG tablet Take 6.25 mg by mouth 2 (two) times daily with a meal.    . cholecalciferol (VITAMIN D) 1000 UNITS tablet Take 2,000 Units by mouth daily.      . furosemide (LASIX) 20 MG tablet Take 1 tablet (20 mg total) by mouth as needed for fluid. 30 tablet 6  . levothyroxine (SYNTHROID, LEVOTHROID) 50 MCG tablet Take 1 tablet (50 mcg total) by mouth every morning. 90 tablet 1  . nitroGLYCERIN (NITROSTAT) 0.4 MG SL tablet Place 1 tablet (0.4 mg total) under the tongue every 5 (five) minutes as needed. For chest pain 25 tablet 6  . omeprazole (PRILOSEC) 20 MG capsule Take 1 capsule (20 mg total) by mouth daily. (Patient taking differently: Take 20 mg by mouth as needed. ) 90 capsule 3  . ramipril (ALTACE) 2.5 MG capsule Take 2.5 mg by mouth  daily.    Marland Kitchen spironolactone (ALDACTONE) 25 MG tablet Take 12.5 mg by mouth daily.     No current facility-administered medications for this visit.     Past Medical History  Diagnosis Date  . 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  . Cardiac defibrillator in situ     s/p MDT Maximo VR single lead ICD - 07/06/2006  . Hyperlipidemia   . Hypertension   . Ischemic cardiomyopathy     EF 40% 12/2008 V gram;  Echocardiogram 11/08/11: EF 30-35%, inferoseptal and anteroseptal, apical septal/lateral/anterior/inferior and true apical akinesis, no LV thrombus, grade 1 diastolic dysfunction.  Marland Kitchen History of orthostatic hypotension   . Degenerative joint disease   . Hypothyroidism   . Premature atrial contractions   . Premature ventricular contractions   . Systolic CHF, chronic   . Lumbar spondylosis     s/p L5-S1 fusion 12/11/2009  . Skin cancer     skin  . H/O hiatal hernia     times 3    Past Surgical History  Procedure Laterality Date  . Icd  s/p MDT Maximo VR single lead ICD - 07/06/2006  . Lumbar fusion      s/p L5-S1 fusion 12/11/2009  . Back surgery    . Left heart catheterization with coronary angiogram [name] 11/09/2011    Procedure: LEFT HEART CATHETERIZATION WITH CORONARY ANGIOGRAM;  Surgeon: Josue Hector, MD;  Location: [facility] CATH LAB;  Service: Cardiovascular;  Laterality: [name];    History   Social History  . Marital Status: Married    Spouse Name: [name]  . Number of Children: [name]  . Years of Education: [name]   Occupational History  . retired [name]   Social History Main Topics  . Smoking status: Never Smoker   . Smokeless tobacco: Not on file  . Alcohol Use: No  . Drug Use:  No  . Sexual Activity: No   Other Topics Concern  . Not on file   Social History Narrative   Active, walks regularly.    ROS: back pain but no fevers or chills, productive cough, hemoptysis, dysphasia, odynophagia, melena, hematochezia, dysuria, hematuria, rash, seizure activity, orthopnea, PND, pedal edema, claudication. Remaining systems are negative.  Physical Exam: Well-developed well-nourished in no acute distress.  Skin is warm and dry.  HEENT is normal.  Neck is supple.  Chest is clear to auscultation with normal expansion.  Cardiovascular exam is regular rate and rhythm.  Abdominal exam nontender or distended. No masses palpated. Extremities show no edema. neuro grossly intact

## 2015-04-16 ENCOUNTER — Other Ambulatory Visit: Payer: Self-pay | Admitting: Internal Medicine

## 2015-04-16 ENCOUNTER — Other Ambulatory Visit: Payer: Medicare Other | Admitting: Internal Medicine

## 2015-04-16 DIAGNOSIS — E785 Hyperlipidemia, unspecified: Secondary | ICD-10-CM | POA: Diagnosis not present

## 2015-04-16 DIAGNOSIS — R5383 Other fatigue: Secondary | ICD-10-CM

## 2015-04-16 DIAGNOSIS — Z125 Encounter for screening for malignant neoplasm of prostate: Secondary | ICD-10-CM | POA: Diagnosis not present

## 2015-04-16 DIAGNOSIS — Z79899 Other long term (current) drug therapy: Secondary | ICD-10-CM | POA: Diagnosis not present

## 2015-04-16 DIAGNOSIS — R7303 Prediabetes: Secondary | ICD-10-CM

## 2015-04-16 DIAGNOSIS — E039 Hypothyroidism, unspecified: Secondary | ICD-10-CM | POA: Diagnosis not present

## 2015-04-16 DIAGNOSIS — R7309 Other abnormal glucose: Secondary | ICD-10-CM | POA: Diagnosis not present

## 2015-04-16 LAB — LIPID PANEL
Cholesterol: 151 mg/dL (ref 0–200)
HDL: 84 mg/dL (ref >=40)
LDL Cholesterol: 59 mg/dL (ref 0–99)
Total CHOL/HDL Ratio: 1.8 Ratio
Triglycerides: 38 mg/dL (ref <150)
VLDL: 8 mg/dL (ref 0–40)

## 2015-04-16 LAB — CBC WITH DIFFERENTIAL/PLATELET
BASOS ABS: 0 10*3/uL (ref 0.0–0.1)
Basophils Relative: 0 % (ref 0–1)
EOS ABS: 0.1 10*3/uL (ref 0.0–0.7)
Eosinophils Relative: 2 % (ref 0–5)
HEMATOCRIT: 42.2 % (ref 39.0–52.0)
HEMOGLOBIN: 13.9 g/dL (ref 13.0–17.0)
LYMPHS PCT: 16 % (ref 12–46)
Lymphs Abs: 1 10*3/uL (ref 0.7–4.0)
MCH: 30 pg (ref 26.0–34.0)
MCHC: 32.9 g/dL (ref 30.0–36.0)
MCV: 91.1 fL (ref 78.0–100.0)
MONO ABS: 0.7 10*3/uL (ref 0.1–1.0)
MPV: 10.2 fL (ref 8.6–12.4)
Monocytes Relative: 11 % (ref 3–12)
Neutro Abs: 4.4 10*3/uL (ref 1.7–7.7)
Neutrophils Relative %: 71 % (ref 43–77)
Platelets: 215 10*3/uL (ref 150–400)
RBC: 4.63 MIL/uL (ref 4.22–5.81)
RDW: 13.6 % (ref 11.5–15.5)
WBC: 6.2 10*3/uL (ref 4.0–10.5)

## 2015-04-16 LAB — COMPLETE METABOLIC PANEL WITH GFR
ALT: 14 U/L (ref 0–53)
AST: 18 U/L (ref 0–37)
Albumin: 4.2 g/dL (ref 3.5–5.2)
Alkaline Phosphatase: 97 U/L (ref 39–117)
BUN: 17 mg/dL (ref 6–23)
CALCIUM: 9.8 mg/dL (ref 8.4–10.5)
CHLORIDE: 98 meq/L (ref 96–112)
CO2: 30 meq/L (ref 19–32)
CREATININE: 1.11 mg/dL (ref 0.50–1.35)
GFR, Est African American: 73 mL/min
GFR, Est Non African American: 63 mL/min
GLUCOSE: 98 mg/dL (ref 70–99)
POTASSIUM: 4.5 meq/L (ref 3.5–5.3)
SODIUM: 137 meq/L (ref 135–145)
Total Bilirubin: 0.8 mg/dL (ref 0.2–1.2)
Total Protein: 6.5 g/dL (ref 6.0–8.3)

## 2015-04-16 LAB — HEMOGLOBIN A1C
Hgb A1c MFr Bld: 6 % — ABNORMAL HIGH (ref <5.7)
MEAN PLASMA GLUCOSE: 126 mg/dL — AB (ref <117)

## 2015-04-17 LAB — PSA, MEDICARE: PSA: 4.37 ng/mL — AB (ref <=4.00)

## 2015-04-18 ENCOUNTER — Ambulatory Visit (INDEPENDENT_AMBULATORY_CARE_PROVIDER_SITE_OTHER): Payer: Medicare Other | Admitting: Internal Medicine

## 2015-04-18 ENCOUNTER — Encounter: Payer: Self-pay | Admitting: Internal Medicine

## 2015-04-18 VITALS — BP 120/80 | HR 68 | Temp 97.6°F | Ht 68.0 in | Wt 151.0 lb

## 2015-04-18 DIAGNOSIS — K219 Gastro-esophageal reflux disease without esophagitis: Secondary | ICD-10-CM | POA: Diagnosis not present

## 2015-04-18 DIAGNOSIS — I10 Essential (primary) hypertension: Secondary | ICD-10-CM | POA: Diagnosis not present

## 2015-04-18 DIAGNOSIS — Z0000 Encounter for general adult medical examination without abnormal findings: Secondary | ICD-10-CM

## 2015-04-18 DIAGNOSIS — Z8679 Personal history of other diseases of the circulatory system: Secondary | ICD-10-CM

## 2015-04-18 DIAGNOSIS — E039 Hypothyroidism, unspecified: Secondary | ICD-10-CM | POA: Diagnosis not present

## 2015-04-18 DIAGNOSIS — M519 Unspecified thoracic, thoracolumbar and lumbosacral intervertebral disc disorder: Secondary | ICD-10-CM

## 2015-04-18 DIAGNOSIS — R7302 Impaired glucose tolerance (oral): Secondary | ICD-10-CM

## 2015-04-18 DIAGNOSIS — I519 Heart disease, unspecified: Secondary | ICD-10-CM | POA: Diagnosis not present

## 2015-04-18 DIAGNOSIS — Z95810 Presence of automatic (implantable) cardiac defibrillator: Secondary | ICD-10-CM

## 2015-04-18 DIAGNOSIS — E785 Hyperlipidemia, unspecified: Secondary | ICD-10-CM

## 2015-04-18 DIAGNOSIS — I5189 Other ill-defined heart diseases: Secondary | ICD-10-CM

## 2015-04-18 DIAGNOSIS — I259 Chronic ischemic heart disease, unspecified: Secondary | ICD-10-CM

## 2015-04-18 LAB — TSH: TSH: 1.731 u[IU]/mL (ref 0.350–4.500)

## 2015-04-18 MED ORDER — LEVOTHYROXINE SODIUM 50 MCG PO TABS: 50.0000 ug | ORAL_TABLET | ORAL | Status: DC

## 2015-04-19 ENCOUNTER — Encounter: Payer: Self-pay | Admitting: Cardiology

## 2015-04-19 ENCOUNTER — Ambulatory Visit (INDEPENDENT_AMBULATORY_CARE_PROVIDER_SITE_OTHER): Payer: Medicare Other | Admitting: Cardiology

## 2015-04-19 DIAGNOSIS — I255 Ischemic cardiomyopathy: Secondary | ICD-10-CM | POA: Diagnosis not present

## 2015-04-19 DIAGNOSIS — E785 Hyperlipidemia, unspecified: Secondary | ICD-10-CM

## 2015-04-19 MED ORDER — RAMIPRIL 5 MG PO CAPS: 5.0000 mg | ORAL_CAPSULE | Freq: Every day | ORAL | Status: DC

## 2015-04-19 MED ORDER — NITROGLYCERIN 0.4 MG SL SUBL: 0.4000 mg | SUBLINGUAL_TABLET | SUBLINGUAL | Status: DC | PRN

## 2015-04-19 NOTE — Assessment & Plan Note (Signed)
Patient is euvolemic on examination. Continue present dose of diabetics.

## 2015-04-19 NOTE — Assessment & Plan Note (Signed)
Continue aspirin and statin. 

## 2015-04-19 NOTE — Assessment & Plan Note (Signed)
Continue statin. 

## 2015-04-19 NOTE — Patient Instructions (Signed)
INCREASE Ramipril to 5mg  daily.  Your physician recommends that you schedule a follow-up appointment in: One Year.

## 2015-04-19 NOTE — Assessment & Plan Note (Signed)
Followed by electrophysiology. For generator change next week.

## 2015-04-19 NOTE — Assessment & Plan Note (Signed)
Continue beta blocker. Increase ramipril to 5 mg daily.

## 2015-04-19 NOTE — Assessment & Plan Note (Signed)
Continue present blood pressure medications. 

## 2015-04-25 ENCOUNTER — Encounter (HOSPITAL_COMMUNITY)
Admission: RE | Disposition: A | Discharge status: Home / Self Care | Payer: Self-pay | Source: Ambulatory Visit | Attending: Internal Medicine

## 2015-04-25 ENCOUNTER — Ambulatory Visit (HOSPITAL_COMMUNITY)
Admission: RE | Admit: 2015-04-25 | Discharge: 2015-04-25 | Disposition: A | Discharge status: Home / Self Care | Payer: Medicare Other | Source: Ambulatory Visit | Attending: Internal Medicine | Admitting: Internal Medicine

## 2015-04-25 DIAGNOSIS — I255 Ischemic cardiomyopathy: Secondary | ICD-10-CM | POA: Diagnosis not present

## 2015-04-25 DIAGNOSIS — I5022 Chronic systolic (congestive) heart failure: Secondary | ICD-10-CM | POA: Insufficient documentation

## 2015-04-25 DIAGNOSIS — Z95810 Presence of automatic (implantable) cardiac defibrillator: Secondary | ICD-10-CM | POA: Diagnosis present

## 2015-04-25 DIAGNOSIS — E039 Hypothyroidism, unspecified: Secondary | ICD-10-CM | POA: Insufficient documentation

## 2015-04-25 DIAGNOSIS — Z7982 Long term (current) use of aspirin: Secondary | ICD-10-CM | POA: Diagnosis not present

## 2015-04-25 DIAGNOSIS — M47816 Spondylosis without myelopathy or radiculopathy, lumbar region: Secondary | ICD-10-CM | POA: Diagnosis not present

## 2015-04-25 DIAGNOSIS — I2510 Atherosclerotic heart disease of native coronary artery without angina pectoris: Secondary | ICD-10-CM | POA: Insufficient documentation

## 2015-04-25 DIAGNOSIS — E785 Hyperlipidemia, unspecified: Secondary | ICD-10-CM | POA: Insufficient documentation

## 2015-04-25 DIAGNOSIS — I10 Essential (primary) hypertension: Secondary | ICD-10-CM | POA: Insufficient documentation

## 2015-04-25 DIAGNOSIS — I252 Old myocardial infarction: Secondary | ICD-10-CM | POA: Insufficient documentation

## 2015-04-25 DIAGNOSIS — M1990 Unspecified osteoarthritis, unspecified site: Secondary | ICD-10-CM | POA: Diagnosis not present

## 2015-04-25 DIAGNOSIS — Z85828 Personal history of other malignant neoplasm of skin: Secondary | ICD-10-CM | POA: Insufficient documentation

## 2015-04-25 DIAGNOSIS — Z91018 Allergy to other foods: Secondary | ICD-10-CM | POA: Insufficient documentation

## 2015-04-25 HISTORY — PX: EP IMPLANTABLE DEVICE: SHX172B

## 2015-04-25 LAB — SURGICAL PCR SCREEN
MRSA, PCR: NEGATIVE
Staphylococcus aureus: NEGATIVE

## 2015-04-25 SURGERY — ICD/BIV ICD GENERATOR CHANGEOUT
Anesthesia: LOCAL

## 2015-04-25 MED ORDER — CEFAZOLIN SODIUM-DEXTROSE 2-3 GM-% IV SOLR: 2.0000 g | INTRAVENOUS | Status: DC

## 2015-04-25 MED ORDER — ONDANSETRON HCL 4 MG/2ML IJ SOLN: 4.0000 mg | Freq: Four times a day (QID) | INTRAMUSCULAR | Status: DC | PRN

## 2015-04-25 MED ORDER — SODIUM CHLORIDE 0.9 % IV SOLN
INTRAVENOUS | Status: DC
  Administered 2015-04-25: 09:00:00 via INTRAVENOUS

## 2015-04-25 MED ORDER — DEXTROSE 5 % IV SOLN
2.0000 g | INTRAVENOUS | Status: DC | PRN
  Administered 2015-04-25: 2 g via INTRAVENOUS

## 2015-04-25 MED ORDER — LIDOCAINE HCL (PF) 1 % IJ SOLN
INTRAMUSCULAR | Status: DC | PRN
  Administered 2015-04-25: 40 mg via SUBCUTANEOUS

## 2015-04-25 MED ORDER — SODIUM CHLORIDE 0.9 % IR SOLN: 80.0000 mg | Status: DC

## 2015-04-25 MED ORDER — FLUMAZENIL 1 MG/10ML IV SOLN
INTRAVENOUS | Status: DC | PRN
  Administered 2015-04-25: 0.3 mg via INTRAVENOUS

## 2015-04-25 MED ORDER — ACETAMINOPHEN 325 MG PO TABS
325.0000 mg | ORAL_TABLET | ORAL | Status: DC | PRN
  Filled 2015-04-25: qty 2

## 2015-04-25 MED ORDER — MUPIROCIN 2 % EX OINT
TOPICAL_OINTMENT | CUTANEOUS | Status: AC
  Administered 2015-04-25: 1
  Filled 2015-04-25: qty 22

## 2015-04-25 MED ORDER — CHLORHEXIDINE GLUCONATE 4 % EX LIQD
60.0000 mL | Freq: Once | CUTANEOUS | Status: AC
  Administered 2015-04-25: 4 via TOPICAL
  Filled 2015-04-25: qty 60

## 2015-04-25 MED ORDER — CEFAZOLIN SODIUM-DEXTROSE 2-3 GM-% IV SOLR
INTRAVENOUS | Status: AC
  Filled 2015-04-25: qty 50

## 2015-04-25 MED ORDER — MIDAZOLAM HCL 5 MG/5ML IJ SOLN
INTRAMUSCULAR | Status: DC | PRN
  Administered 2015-04-25 (×3): 1 mg via INTRAVENOUS
  Administered 2015-04-25: 2 mg via INTRAVENOUS
  Administered 2015-04-25 (×3): 1 mg via INTRAVENOUS

## 2015-04-25 MED ORDER — FLUMAZENIL 1 MG/10ML IV SOLN
INTRAVENOUS | Status: AC
  Filled 2015-04-25: qty 10

## 2015-04-25 MED ORDER — LIDOCAINE HCL (PF) 1 % IJ SOLN
INTRAMUSCULAR | Status: AC
  Filled 2015-04-25: qty 30

## 2015-04-25 MED ORDER — FENTANYL CITRATE (PF) 100 MCG/2ML IJ SOLN
INTRAMUSCULAR | Status: AC
  Filled 2015-04-25: qty 2

## 2015-04-25 MED ORDER — MIDAZOLAM HCL 5 MG/5ML IJ SOLN
INTRAMUSCULAR | Status: AC
  Filled 2015-04-25: qty 5

## 2015-04-25 MED ORDER — FENTANYL CITRATE (PF) 100 MCG/2ML IJ SOLN
INTRAMUSCULAR | Status: DC | PRN
  Administered 2015-04-25 (×2): 12.5 ug via INTRAVENOUS
  Administered 2015-04-25: 25 ug via INTRAVENOUS
  Administered 2015-04-25 (×4): 12.5 ug via INTRAVENOUS

## 2015-04-25 MED ORDER — SODIUM CHLORIDE 0.9 % IV BOLUS (SEPSIS)
INTRAVENOUS | Status: DC | PRN
  Administered 2015-04-25: 500 mL via INTRAVENOUS

## 2015-04-25 SURGICAL SUPPLY — 4 items
CABLE SURGICAL S-101-97-12 (CABLE) ×2
ICD EVERA XT VR DVBB1D1 (ICD Generator) ×2 IMPLANT
PAD DEFIB LIFELINK (PAD) ×2
TRAY PACEMAKER INSERTION (CUSTOM PROCEDURE TRAY) ×2

## 2015-04-25 NOTE — Progress Notes (Signed)
Observing pt post versed reversal with romazicon. Pt remains alert ,conversing with family members as of 1127 am . Anticipate Tx to short stay( post 90 min from reversal) at 1200 noon.

## 2015-04-25 NOTE — H&P (Signed)
Electrophysiology Office Note Date: 04/10/2015  ID: Corey Jordan, DOB June 04, 1934, MRN [id number]  PCP: Corey Showers, MD Primary Cardiologist: Corey Jordan Electrophysiologist: Corey Jordan  CC: ICD at [place] is a 81 y.o. male is seen today for Dr Corey Jordan. His ICD was recently found to be at [facility] and he presents today for further evaluation. Since last being seen in our clinic, the patient reports doing very well. He denies chest pain, palpitations, dyspnea, PND, orthopnea, nausea, vomiting, dizziness, syncope, edema, weight gain, or early satiety. He has not had ICD shocks.   Device History: MDT singe chamber ICD implanted 2007 for ischemic cardiomyopathy History of appropriate therapy: No History of AAD therapy: No   Past Medical History  Diagnosis Date  . 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  . Cardiac defibrillator in situ     s/p MDT Maximo VR single lead ICD - 07/06/2006  . Hyperlipidemia   . Hypertension   . Ischemic cardiomyopathy     EF 40% 12/2008 V gram; Echocardiogram 11/08/11: EF 30-35%, inferoseptal and anteroseptal, apical septal/lateral/anterior/inferior and true apical akinesis, no LV thrombus, grade 1 diastolic dysfunction.  Marland Kitchen History of orthostatic hypotension   . Degenerative joint disease   . Hypothyroidism   . Premature atrial contractions   . Premature ventricular contractions   . Systolic CHF, chronic   . Lumbar spondylosis     s/p L5-S1 fusion 12/11/2009  . Skin cancer     skin  . H/O hiatal hernia     times 3   Past Surgical History  Procedure Laterality Date  . Icd      s/p  MDT Maximo VR single lead ICD - 07/06/2006  . Lumbar fusion      s/p L5-S1 fusion 12/11/2009  . Back surgery    . Left heart catheterization with coronary angiogram [name] 11/09/2011    Procedure: LEFT HEART CATHETERIZATION WITH CORONARY ANGIOGRAM; Surgeon: Corey Hector, MD; Location: [facility] CATH LAB; Service: Cardiovascular; Laterality: [name];    Current Outpatient Prescriptions  Medication Sig Dispense Refill  . aspirin 81 MG EC tablet Take 81 mg by mouth daily.     Marland Kitchen atorvastatin (LIPITOR) 40 MG tablet TAKE 1 TABLET DAILY 90 tablet 1  . calcium carbonate (OS-CAL) 600 MG TABS Take 600 mg by mouth 2 (two) times daily with a meal.     . carvedilol (COREG) 6.25 MG tablet TAKE 1 TABLET TWICE A DAY WITH MEALS 180 tablet 1  . cholecalciferol (VITAMIN D) 1000 UNITS tablet Take 2,000 Units by mouth daily.     . furosemide (LASIX) 20 MG tablet Take 20 mg by mouth as needed for fluid.     Marland Kitchen levothyroxine (SYNTHROID, LEVOTHROID) 50 MCG tablet Take 1 tablet (50 mcg total) by mouth every morning. 90 tablet 3  . nitroGLYCERIN (NITROSTAT) 0.4 MG SL tablet Place 1 tablet (0.4 mg total) under the tongue every 5 (five) minutes as needed. For chest pain 25 tablet 6  . omeprazole (PRILOSEC) 20 MG capsule Take 1 capsule (20 mg total) by mouth daily. (Patient taking differently: Take 20 mg by mouth as needed. ) 90 capsule 3  . ramipril (ALTACE) 2.5 MG  capsule TAKE 1 CAPSULE EVERY MORNING 90 capsule 3  . spironolactone (ALDACTONE) 25 MG tablet TAKE 1/2 TABLET (=12.5MG ) DAILY 45 tablet 0   No current facility-administered medications for this visit.    Allergies: Pollen extract   Social History: History   Social History  . Marital Status: Married    Spouse Name: [name]  . Number of Children: [name]  . Years of Education: [name]   Occupational History  . retired [name]   Social History  Main Topics  . Smoking status: Never Smoker   . Smokeless tobacco: Not on file  . Alcohol Use: No  . Drug Use: No  . Sexual Activity: No   Other Topics Concern  . Not on file   Social History Narrative   Active, walks regularly.    Family History: Family History  Problem Relation Age of Onset  . Pneumonia Mother     deceased 21  . Peripheral vascular disease Father     deceased 70    Review of Systems: All other systems reviewed and are otherwise negative except as noted above.   Physical Exam: VS: There were no vitals taken for this visit. , BMI There is no weight on file to calculate BMI.  GEN- The patient is elderly appearing, alert and oriented x 3 today.  HEENT: normocephalic, atraumatic; sclera clear, conjunctiva pink; hearing intact; oropharynx clear; neck supple, no JVP Lymph- no cervical lymphadenopathy Lungs- Clear to ausculation bilaterally, normal work of breathing. No wheezes, rales, rhonchi Heart- Regular rate and rhythm, no murmurs, rubs or gallops GI- soft, non-tender, non-distended, bowel sounds present, no hepatosplenomegaly Extremities- no clubbing, cyanosis, or edema; DP/PT/radial pulses 2+ bilaterally MS- no significant deformity or atrophy Skin- warm and dry, no rash or lesion; ICD pocket well healed, device very superficial Psych- euthymic mood, full affect Neuro- strength and sensation are intact  ICD interrogation- reviewed in detail today, See PACEART report  EKG: EKG is ordered today. The ekg ordered today shows sinus rhythm, rate 69, poor R wave progression, normal intervals  Recent Labs: 10/09/2014: ALT 16; BUN 12; Creatinine 0.93; Potassium 4.5; Sodium 140; TSH 1.794   Wt Readings from Last 3 Encounters:  10/11/14 156 lb (70.761 kg)  07/19/14 154 lb (69.854 kg)  04/18/14 150 lb (68.04 kg)     Other studies Reviewed: Additional studies/ records that were reviewed today  include: Dr Corey Jordan and Dr Jacalyn Lefevre office notes, myoview  Assessment and Plan:  1. Chronic systolic dysfunction euvolemic today Stable on an appropriate medical regimen His ICD has reached ERI. He has never had appropriate therapy. Myoview last year demonstrated persistently depressed EF. Risks, benefits to ICD generator change were reviewed with the patient who wishes to proceed. Will schedule with Dr Corey Jordan next available time.  His device is relatively superficial due to lack of adipose tissue - Dr Corey Jordan examined patient today - will plan to place device sub-pec.   2. CAD/ICM No recent ischemic symptoms The patient remains very active Continue medical therapy Myoview 03/2014 with mild per-infarct ischemia, EF 34%  3. HTN Elevated in the office today. The patient reports that he checks his blood pressure at home and systolic is 845-364'W No change today  Current medicines are reviewed at length with the patient today.  The patient does not have concerns regarding his medicines. The following changes were made today: none  Labs/ tests ordered today include: none  He is having physical labs with Dr Renold Genta next week - we will  use these as pre-procedure labs.       EP Attending  Patient seen and examined. Agree with above. Will plan to proceed with ICD generator change out today. As his subcutaneous tissue is reduced, will place new device subpectorally.  Mikle Bosworth.D.

## 2015-04-25 NOTE — Discharge Instructions (Signed)
Pacemaker Battery Change, Care After °Refer to this sheet in the next few weeks. These instructions provide you with information on caring for yourself after your procedure. Your health care provider may also give you more specific instructions. Your treatment has been planned according to current medical practices, but problems sometimes occur. Call your health care provider if you have any problems or questions after your procedure. °WHAT TO EXPECT AFTER THE PROCEDURE °After your procedure, it is typical to have the following sensations: °· Soreness at the pacemaker site. °HOME CARE INSTRUCTIONS  °· Keep the incision clean and dry. °· Unless advised otherwise, you may shower beginning 48 hours after your procedure. °· For the first week after the replacement, avoid stretching motions that pull at the incision site, and avoid heavy exercise with the arm that is on the same side as the incision. °· Take medicines only as directed by your health care provider. °· Keep all follow-up visits as directed by your health care provider. °SEEK MEDICAL CARE IF:  °· You have pain at the incision site that is not relieved by over-the-counter or prescription medicine. °· There is drainage or pus from the incision site. °· There is swelling larger than a lime at the incision site. °· You develop red streaking that extends above or below the incision site. °· You feel brief, intermittent palpitations, light-headedness, or any symptoms that you feel might be related to your heart. °SEEK IMMEDIATE MEDICAL CARE IF:  °· You experience chest pain that is different than the pain at the pacemaker site. °· You experience shortness of breath. °· You have palpitations or irregular heartbeat. °· You have light-headedness that does not go away quickly. °· You faint. °· You have pain that gets worse and is not relieved by medicine. °Document Released: 08/30/2013 Document Revised: 03/26/2014 Document Reviewed: 08/30/2013 °[employer]® Patient  Information ©2015 [employer]. This information is not intended to replace advice given to you by your health care provider. Make sure you discuss any questions you have with your health care provider. ° °

## 2015-04-25 NOTE — H&P (Signed)
  ICD Criteria  Current LVEF:32% ;Obtained > 6 months ago.   NYHA Functional Classification: Class II  Heart Failure History:  Yes, Duration of heart failure since onset is > 9 months  Non-Ischemic Dilated Cardiomyopathy History:  No.  Atrial Fibrillation/Atrial Flutter:  No.  Ventricular Tachycardia History:  No.  Cardiac Arrest History:  No  History of Syndromes with Risk of Sudden Death:  No.  Previous ICD:  Yes, ICD Type:  Single, Reason for ICD:  Primary prevention.  30%  Electrophysiology Study: No.  Prior MI: Yes, Most recent MI timeframe is > 40 days.  PPM: No.  OSA:  No  Patient Life Expectancy of >=1 year: Yes.  Anticoagulation Therapy:  Patient is NOT on anticoagulation therapy.   Beta Blocker Therapy:  Yes.   Ace Inhibitor/ARB Therapy:  Yes.

## 2015-04-26 ENCOUNTER — Encounter (HOSPITAL_COMMUNITY): Payer: Self-pay | Admitting: Internal Medicine

## 2015-05-07 ENCOUNTER — Telehealth: Payer: Self-pay | Admitting: Internal Medicine

## 2015-05-07 NOTE — Telephone Encounter (Signed)
Spoke w/ pt step son and informed him that the return kit was for previous monitor. He stated that he did not have a monitor before just the one he got when he left the hospital. Informed pt step son that pt can disregard box. He verbalized understanding.

## 2015-05-07 NOTE — Telephone Encounter (Signed)
New Message        Pt's wife calling stating that they received an empty box today that is supposed to be for them to return the pt's monitor. They were unaware that it needed to be returned. They are calling for clarification on what they are supposed to be doing with the monitor. Please call back and advise.

## 2015-05-13 ENCOUNTER — Encounter: Payer: Self-pay | Admitting: Internal Medicine

## 2015-05-13 ENCOUNTER — Ambulatory Visit (INDEPENDENT_AMBULATORY_CARE_PROVIDER_SITE_OTHER): Payer: Medicare Other | Admitting: Internal Medicine

## 2015-05-13 VITALS — BP 118/64 | HR 79 | Ht 68.0 in | Wt 154.0 lb

## 2015-05-13 DIAGNOSIS — I5022 Chronic systolic (congestive) heart failure: Secondary | ICD-10-CM | POA: Diagnosis not present

## 2015-05-13 DIAGNOSIS — Z45018 Encounter for adjustment and management of other part of cardiac pacemaker: Secondary | ICD-10-CM

## 2015-05-13 DIAGNOSIS — I255 Ischemic cardiomyopathy: Secondary | ICD-10-CM | POA: Diagnosis not present

## 2015-05-13 DIAGNOSIS — I491 Atrial premature depolarization: Secondary | ICD-10-CM | POA: Diagnosis not present

## 2015-05-13 DIAGNOSIS — Z95810 Presence of automatic (implantable) cardiac defibrillator: Secondary | ICD-10-CM

## 2015-05-13 LAB — CUP PACEART INCLINIC DEVICE CHECK
Battery Remaining Longevity: 130 mo
Battery Voltage: 3.09 V
Date Time Interrogation Session: 20160620171106
HIGH POWER IMPEDANCE MEASURED VALUE: 60 Ohm
HighPow Impedance: 190 Ohm
HighPow Impedance: 41 Ohm
Lead Channel Impedance Value: 551 Ohm
Lead Channel Pacing Threshold Pulse Width: 0.4 ms
Lead Channel Setting Pacing Amplitude: 2.5 V
Lead Channel Setting Pacing Pulse Width: 0.4 ms
MDC IDC MSMT LEADCHNL RV PACING THRESHOLD AMPLITUDE: 0.75 V
MDC IDC MSMT LEADCHNL RV SENSING INTR AMPL: 24.5 mV
MDC IDC MSMT LEADCHNL RV SENSING INTR AMPL: 25.25 mV
MDC IDC SET LEADCHNL RV SENSING SENSITIVITY: 0.3 mV
MDC IDC STAT BRADY RV PERCENT PACED: 0.12 %
Zone Setting Detection Interval: 250 ms
Zone Setting Detection Interval: 300 ms
Zone Setting Detection Interval: 350 ms
Zone Setting Detection Interval: 450 ms

## 2015-05-13 NOTE — Assessment & Plan Note (Signed)
His incision is healing well with a small residual hematoma. Will follow.

## 2015-05-13 NOTE — Progress Notes (Signed)
HPI Corey Jordan returns today for followup. He is a very pleasant 81 year old man with an ischemic cardiomyopathy, chronic systolic heart failure, status post ICD implantation. He is active, and has recently undergone ICD system generator change. He has no chest pain or sob. No ICD shock. He has a small hematoma with no drainage. He denies dietary or medical indiscretion or noncompliance. Allergies  Allergen Reactions  . Pollen Extract Other (See Comments)    Sneezing and watery eyes     Current Outpatient Prescriptions  Medication Sig Dispense Refill  . aspirin 81 MG EC tablet Take 81 mg by mouth daily.      Marland Kitchen atorvastatin (LIPITOR) 40 MG tablet     . calcium carbonate (OS-CAL) 600 MG TABS Take 600 mg by mouth 2 (two) times daily with a meal.      . carvedilol (COREG) 6.25 MG tablet Take 6.25 mg by mouth 2 (two) times daily with a meal.    . cholecalciferol (VITAMIN D) 1000 UNITS tablet Take 2,000 Units by mouth daily.      Marland Kitchen ezetimibe-simvastatin (VYTORIN) 10-20 MG per tablet Take 1 tablet by mouth daily.    . furosemide (LASIX) 20 MG tablet Take 1 tablet (20 mg total) by mouth as needed for fluid. 30 tablet 6  . levothyroxine (SYNTHROID, LEVOTHROID) 50 MCG tablet Take 1 tablet (50 mcg total) by mouth every morning. 90 tablet 1  . nitroGLYCERIN (NITROSTAT) 0.4 MG SL tablet Place 1 tablet (0.4 mg total) under the tongue every 5 (five) minutes as needed. For chest pain 25 tablet 6  . omeprazole (PRILOSEC) 20 MG capsule Take 1 capsule (20 mg total) by mouth daily. (Patient taking differently: Take 20 mg by mouth as needed (for indigestion). ) 90 capsule 3  . ramipril (ALTACE) 5 MG capsule Take 1 capsule (5 mg total) by mouth daily. 90 capsule 3  . spironolactone (ALDACTONE) 25 MG tablet Take 12.5 mg by mouth daily.     No current facility-administered medications for this visit.     Past Medical History  Diagnosis Date  . 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  . Cardiac defibrillator in situ     s/p MDT Maximo VR single lead ICD - 07/06/2006  . Hyperlipidemia   . Hypertension   . Ischemic cardiomyopathy     EF 40% 12/2008 V gram;  Echocardiogram 11/08/11: EF 30-35%, inferoseptal and anteroseptal, apical septal/lateral/anterior/inferior and true apical akinesis, no LV thrombus, grade 1 diastolic dysfunction.  Marland Kitchen History of orthostatic hypotension   . Degenerative joint disease   . Hypothyroidism   . Premature atrial contractions   . Premature ventricular contractions   . Systolic CHF, chronic   . Lumbar spondylosis     s/p L5-S1 fusion 12/11/2009  . Skin cancer     skin  . H/O hiatal hernia     times 3    ROS:   All systems reviewed and negative except as noted in the HPI.   Past Surgical History  Procedure Laterality Date  . Icd      s/p MDT Maximo VR single lead ICD - 07/06/2006  . Lumbar fusion  s/p L5-S1 fusion 12/11/2009  . Back surgery    . Left heart catheterization with coronary angiogram [name] 11/09/2011    Procedure: LEFT HEART CATHETERIZATION WITH CORONARY ANGIOGRAM;  Surgeon: Josue Hector, MD;  Location: [facility] CATH LAB;  Service: Cardiovascular;  Laterality: [name];  . Ep implantable device [name] 04/25/2015    Procedure: ICD Generator Changeout;  Surgeon: Evans Lance, MD;  Location: [place] CV LAB;  Service: Cardiovascular;  Laterality: [name];     Family History  Problem Relation Age of Onset  . Pneumonia Mother     deceased 30  . Peripheral vascular disease Father     deceased 16     History   Social History  . Marital Status: Married    Spouse Name: [name]  . Number of Children: [name]  . Years of Education: [name]   Occupational History  . retired [name]  [employer]   Social History Main Topics  . Smoking status: Never Smoker   . Smokeless tobacco: Not on file  . Alcohol Use: No  . Drug Use: No  . Sexual Activity: No   Other Topics Concern  . Not on file   Social History Narrative   Active, walks regularly.     BP 118/64 mmHg  Pulse 79  Ht 5\' 8"  (1.727 m)  Wt 154 lb (69.854 kg)  BMI 23.42 kg/m2  SpO2 96%  Physical Exam:  Well appearing 81 year old man, NAD HEENT: Unremarkable Neck:  7 cm JVD, no thyromegally Back:  No CVA tenderness Lungs:  Clear with no wheezes, rales, or rhonchi. Small hematoma. HEART:  Regular rate rhythm, no murmurs, no rubs, no clicks Abd:  soft, positive bowel sounds, no organomegally, no rebound, no guarding Ext:  2 plus pulses, no edema, no cyanosis, no clubbing Skin:  No rashes no nodules Neuro:  CN II through XII intact, motor grossly intact    Assess/Plan:

## 2015-05-13 NOTE — Assessment & Plan Note (Signed)
His symptoms are well controlled. He is class 2. No change in meds. He admits to eating sausage and I have asked him to stop.

## 2015-05-13 NOTE — Patient Instructions (Addendum)
Medication Instructions:  Your physician recommends that you continue on your current medications as directed. Please refer to the Current Medication list given to you today.  Labwork: None ordered  Testing/Procedures: None ordered  Follow-Up: Your physician recommends that you schedule a follow-up appointment in: 3 months with Dr. Taylor.   Thank you for choosing [facility]!!          

## 2015-05-20 DIAGNOSIS — H6122 Impacted cerumen, left ear: Secondary | ICD-10-CM | POA: Diagnosis not present

## 2015-05-20 DIAGNOSIS — H6062 Unspecified chronic otitis externa, left ear: Secondary | ICD-10-CM | POA: Diagnosis not present

## 2015-05-22 DIAGNOSIS — R7302 Impaired glucose tolerance (oral): Secondary | ICD-10-CM | POA: Insufficient documentation

## 2015-05-22 NOTE — Patient Instructions (Signed)
Watch diet and exercise. Recheck hemoglobin A1c with other labs in 6 months. Continue same medications.

## 2015-05-22 NOTE — Progress Notes (Signed)
Subjective:    Patient ID: Corey Jordan, male    DOB: Nov 10, 1934, 81 y.o.   MRN: [id number]  HPI Pleasant 81 year old White Male in today for health maintenance exam and evaluation of medical problems. History of chronic ischemic heart disease. He has an internal cardiac defibrillator. History of lumbar disc disease, hypertension, hypothyroidism, hyperlipidemia, GE reflux.  History of ischemic cardiomyopathy with ejection fraction of 30% on cardiac catheterization August 2007. History of class II congestive heart failure. Had implantation of med [facility] cardioverter defibrillator August 2007. He is followed by Dr. Crissie Sickles.  Patient had myocardial infarction in 2004 with total occlusion of the LAD which was stented 2. Right coronary artery had 40% proximal lesion. Circumflex was normal. Patient had 80% diagonal lesion. Was noted at that time to have ejection fraction between 30% and 35%.  History of spondylosis and herniated disc with left radiculopathy L5 and S1 status post L5-S1 fusion by Dr. Carloyn Manner in January 2011.  Colonoscopy with Dr. Deatra Ina October 2005.  History of left inguinal hernia repair by Dr. Rebekah Chesterfield in Summer of 2000  Zostavax vaccine 2011. Pneumovax vaccine January 2010. Tetanus immunization 2011.  Social history: He is retired from the post office. He is married with 2 adult children. Does not smoke or consume alcohol.  Family history: Mother died of pneumonia at age 75. Father died of heart failure at age 50. Father has a history of venous thrombosis and apparently had a leg amputation. Brother died at age 57 of cancer.  He is scheduled for generator change out in the near future by Dr. Lovena Le.    Review of Systems  Constitutional: Positive for fatigue.  Respiratory: Negative.   Cardiovascular: Negative for chest pain, palpitations and leg swelling.  Gastrointestinal: Negative.   Genitourinary: Negative.   Neurological: Negative.   Hematological: Negative.     Psychiatric/Behavioral: Negative.        Objective:   Physical Exam  Constitutional: He is oriented to person, place, and time. He appears well-developed and well-nourished. No distress.  HENT:  Head: Normocephalic and atraumatic.  Right Ear: External ear normal.  Left Ear: External ear normal.  Mouth/Throat: Oropharynx is clear and moist. No oropharyngeal exudate.  Eyes: Conjunctivae and EOM are normal. Pupils are equal, round, and reactive to light. Right eye exhibits no discharge. Left eye exhibits no discharge. No scleral icterus.  Neck: Neck supple. No JVD present. No thyromegaly present.  Cardiovascular: Normal rate, regular rhythm, normal heart sounds and intact distal pulses.   No murmur heard. Pulmonary/Chest: Effort normal and breath sounds normal. No respiratory distress. He has no wheezes. He has no rales.  Abdominal: Soft. Bowel sounds are normal. He exhibits no distension and no mass. There is no tenderness. There is no rebound and no guarding.  Genitourinary: Prostate normal.  Musculoskeletal: He exhibits no edema.  Lymphadenopathy:    He has no cervical adenopathy.  Neurological: He is alert and oriented to person, place, and time. He has normal reflexes. No cranial nerve deficit. Coordination normal.  Skin: No rash noted. He is not diaphoretic.  Psychiatric: He has a normal mood and affect. His behavior is normal. Judgment and thought content normal.  Vitals reviewed.         Assessment & Plan:  Chronic ischemic heart disease  History of internal cardiac defibrillator-for generator change out so  Lumbar disc disease-has back pain from time to time  Hypothyroidism-stable on thyroid replacement  Hypertension  History of coronary artery disease  Hyperlipidemia-on statin therapy  GE reflux  Plan: Continue same medications and return in 6 months. Check hemoglobin A1c at that time. Watch diet and try to exercise some.  Subjective:   Patient presents  for Medicare Annual/Subsequent preventive examination.  Review Past Medical/Family/Social: See above   Risk Factors  Current exercise habits: Walks some Dietary issues discussed: Low fat low carbohydrate  Cardiac risk factors: Hyperlipidemia, impaired glucose tolerance, family history  Depression Screen  (Note: if answer to either of the following is "Yes", a more complete depression screening is indicated)   Over the past two weeks, have you felt down, depressed or hopeless? No  Over the past two weeks, have you felt little interest or pleasure in doing things? No Have you lost interest or pleasure in daily life? No Do you often feel hopeless? No Do you cry easily over simple problems? No   Activities of Daily Living  In your present state of health, do you have any difficulty performing the following activities?:   Driving? No  Managing money? No  Feeding yourself? No  Getting from bed to chair? No  Climbing a flight of stairs? No  Preparing food and eating?: No  Bathing or showering? No  Getting dressed: No  Getting to the toilet? No  Using the toilet:No  Moving around from place to place: No  In the past year have you fallen or had a near fall?:No  Are you sexually active? yes Do you have more than one partner? No   Hearing Difficulties: No  Do you often ask people to speak up or repeat themselves? No  Do you experience ringing or noises in your ears? No  Do you have difficulty understanding soft or whispered voices? No  Do you feel that you have a problem with memory? No Do you often misplace items? No    Home Safety:  Do you have a smoke alarm at your residence? Yes Do you have grab bars in the bathroom? yes Do you have throw rugs in your house? no   Cognitive Testing  Alert? Yes Normal Appearance?Yes  Oriented to person? Yes Place? Yes  Time? Yes  Recall of three objects? Yes  Can perform simple calculations? Yes  Displays appropriate judgment?Yes   Can read the correct time from a watch face?Yes   List the Names of Other Physician/Practitioners you currently use:  See referral list for the physicians patient is currently seeing.  [facility]   Review of Systems: See above   Objective:     General appearance: Appears stated age. Thin elderly male Head: Normocephalic, without obvious abnormality, atraumatic  Eyes: conj clear, EOMi PEERLA  Ears: normal TM's and external ear canals both ears  Nose: Nares normal. Septum midline. Mucosa normal. No drainage or sinus tenderness.  Throat: lips, mucosa, and tongue normal; teeth and gums normal  Neck: no adenopathy, no carotid bruit, no JVD, supple, symmetrical, trachea midline and thyroid not enlarged, symmetric, no tenderness/mass/nodules  No CVA tenderness.  Lungs: clear to auscultation bilaterally  Breasts: normal appearance, no masses or tenderness Heart: regular rate and rhythm, S1, S2 normal, no murmur, click, rub or gallop  Abdomen: soft, non-tender; bowel sounds normal; no masses, no organomegaly  Musculoskeletal: ROM normal in all joints, no crepitus, no deformity, Normal muscle strengthen. Back  is symmetric, no curvature. Skin: Skin color, texture, turgor normal. No rashes or lesions  Lymph nodes: Cervical, supraclavicular, and axillary nodes normal.  Neurologic: CN 2 -12  Normal, Normal symmetric reflexes. Normal coordination and gait  Psych: Alert & Oriented x 3, Mood appear stable.    Assessment:    Annual wellness medicare exam   Plan:    During the course of the visit the patient was educated and counseled about appropriate screening and preventive services including:  Annual PSA       Patient Instructions (the written plan) was given to the patient.  Medicare Attestation  I have personally reviewed:  The patient's medical and social history  Their use of alcohol, tobacco or illicit drugs  Their current medications and  supplements  The patient's functional ability including ADLs,fall risks, home safety risks, cognitive, and hearing and visual impairment  Diet and physical activities  Evidence for depression or mood disorders  The patient's weight, height, BMI, and visual acuity have been recorded in the chart. I have made referrals, counseling, and provided education to the patient based on review of the above and I have provided the patient with a written personalized care plan for preventive services.

## 2015-05-30 ENCOUNTER — Encounter: Payer: Self-pay | Admitting: Internal Medicine

## 2015-06-10 DIAGNOSIS — H6062 Unspecified chronic otitis externa, left ear: Secondary | ICD-10-CM | POA: Diagnosis not present

## 2015-06-10 DIAGNOSIS — H6122 Impacted cerumen, left ear: Secondary | ICD-10-CM | POA: Diagnosis not present

## 2015-06-27 ENCOUNTER — Other Ambulatory Visit: Payer: Self-pay | Admitting: Internal Medicine

## 2015-06-27 MED ORDER — SPIRONOLACTONE 25 MG PO TABS: 12.5000 mg | ORAL_TABLET | Freq: Every day | ORAL | Status: DC

## 2015-07-17 DIAGNOSIS — D692 Other nonthrombocytopenic purpura: Secondary | ICD-10-CM | POA: Diagnosis not present

## 2015-07-17 DIAGNOSIS — L821 Other seborrheic keratosis: Secondary | ICD-10-CM | POA: Diagnosis not present

## 2015-07-17 DIAGNOSIS — L218 Other seborrheic dermatitis: Secondary | ICD-10-CM | POA: Diagnosis not present

## 2015-07-17 DIAGNOSIS — L570 Actinic keratosis: Secondary | ICD-10-CM | POA: Diagnosis not present

## 2015-07-17 DIAGNOSIS — D1801 Hemangioma of skin and subcutaneous tissue: Secondary | ICD-10-CM | POA: Diagnosis not present

## 2015-07-17 DIAGNOSIS — L820 Inflamed seborrheic keratosis: Secondary | ICD-10-CM | POA: Diagnosis not present

## 2015-07-17 DIAGNOSIS — Z85828 Personal history of other malignant neoplasm of skin: Secondary | ICD-10-CM | POA: Diagnosis not present

## 2015-07-17 DIAGNOSIS — D225 Melanocytic nevi of trunk: Secondary | ICD-10-CM | POA: Diagnosis not present

## 2015-07-30 ENCOUNTER — Other Ambulatory Visit: Payer: Self-pay | Admitting: Cardiology

## 2015-08-13 ENCOUNTER — Encounter: Payer: Self-pay | Admitting: Internal Medicine

## 2015-08-13 ENCOUNTER — Other Ambulatory Visit: Payer: Self-pay

## 2015-08-13 ENCOUNTER — Ambulatory Visit (INDEPENDENT_AMBULATORY_CARE_PROVIDER_SITE_OTHER): Payer: Medicare Other | Admitting: Internal Medicine

## 2015-08-13 VITALS — BP 136/70 | HR 68 | Ht 68.0 in | Wt 158.8 lb

## 2015-08-13 DIAGNOSIS — Z95810 Presence of automatic (implantable) cardiac defibrillator: Secondary | ICD-10-CM

## 2015-08-13 DIAGNOSIS — I259 Chronic ischemic heart disease, unspecified: Secondary | ICD-10-CM | POA: Diagnosis not present

## 2015-08-13 DIAGNOSIS — I255 Ischemic cardiomyopathy: Secondary | ICD-10-CM

## 2015-08-13 DIAGNOSIS — I5022 Chronic systolic (congestive) heart failure: Secondary | ICD-10-CM | POA: Diagnosis not present

## 2015-08-13 LAB — CUP PACEART INCLINIC DEVICE CHECK
Battery Remaining Longevity: 130 mo
Brady Statistic RV Percent Paced: 0.13 %
Date Time Interrogation Session: 20160920123739
HIGH POWER IMPEDANCE MEASURED VALUE: 190 Ohm
HIGH POWER IMPEDANCE MEASURED VALUE: 42 Ohm
HIGH POWER IMPEDANCE MEASURED VALUE: 60 Ohm
Lead Channel Impedance Value: 551 Ohm
Lead Channel Pacing Threshold Pulse Width: 0.4 ms
Lead Channel Sensing Intrinsic Amplitude: 26 mV
Lead Channel Setting Pacing Amplitude: 2.5 V
Lead Channel Setting Sensing Sensitivity: 0.3 mV
MDC IDC MSMT BATTERY VOLTAGE: 3.07 V
MDC IDC MSMT LEADCHNL RV PACING THRESHOLD AMPLITUDE: 0.75 V
MDC IDC SET LEADCHNL RV PACING PULSEWIDTH: 0.4 ms
MDC IDC SET ZONE DETECTION INTERVAL: 350 ms
Zone Setting Detection Interval: 250 ms
Zone Setting Detection Interval: 300 ms
Zone Setting Detection Interval: 450 ms

## 2015-08-13 NOTE — Assessment & Plan Note (Signed)
He is asymptomatic. He remains active. No change in meds.

## 2015-08-13 NOTE — Assessment & Plan Note (Signed)
His medtronic ICD is working normally. Will recheck in several months. 

## 2015-08-13 NOTE — Patient Instructions (Signed)
Medication Instructions:  Your physician recommends that you continue on your current medications as directed. Please refer to the Current Medication list given to you today.   Labwork: None ordered  Testing/Procedures: None ordered  Follow-Up: Your physician wants you to follow-up in: 9 months with Dr Knox Saliva will receive a reminder letter in the mail two months in advance. If you don't receive a letter, please call our office to schedule the follow-up appointment.  Remote monitoring is used to monitor your  ICD from home. This monitoring reduces the number of office visits required to check your device to one time per year. It allows [name] to keep an eye on the functioning of your device to ensure it is working properly. You are scheduled for a device check from home on 11/12/15. You may send your transmission at any time that day. If you have a wireless device, the transmission will be sent automatically. After your physician reviews your transmission, you will receive a postcard with your next transmission date.     Any Other Special Instructions Will Be Listed Below (If Applicable).

## 2015-08-13 NOTE — Progress Notes (Signed)
HPI Mr. Stebner returns today for followup. He is a very pleasant 81 year old man with an ischemic cardiomyopathy, chronic systolic heart failure, status post ICD implantation. He is active, and has recently undergone ICD system generator change with pocket relocation to a subpectoral location. He has no chest pain or sob. No ICD shock.  He denies dietary or medical indiscretion or noncompliance. Allergies  Allergen Reactions  . Pollen Extract Other (See Comments)    Sneezing and watery eyes     Current Outpatient Prescriptions  Medication Sig Dispense Refill  . aspirin 81 MG EC tablet Take 81 mg by mouth daily.      Marland Kitchen atorvastatin (LIPITOR) 40 MG tablet Take 40 mg by mouth daily.     . calcium carbonate (OS-CAL) 600 MG TABS Take 600 mg by mouth daily.     . carvedilol (COREG) 6.25 MG tablet Take 6.25 mg by mouth 2 (two) times daily with a meal.    . cholecalciferol (VITAMIN D) 1000 UNITS tablet Take 1,000 Units by mouth daily.     . furosemide (LASIX) 20 MG tablet Take 20 mg by mouth daily as needed for fluid.    Marland Kitchen levothyroxine (SYNTHROID, LEVOTHROID) 50 MCG tablet Take 1 tablet (50 mcg total) by mouth every morning. 90 tablet 1  . nitroGLYCERIN (NITROSTAT) 0.4 MG SL tablet Place 1 tablet (0.4 mg total) under the tongue every 5 (five) minutes as needed. For chest pain 25 tablet 6  . omeprazole (PRILOSEC) 20 MG capsule Take 20 mg by mouth daily as needed (heart burn).    . ramipril (ALTACE) 5 MG capsule Take 1 capsule (5 mg total) by mouth daily. 90 capsule 3  . spironolactone (ALDACTONE) 25 MG tablet Take 0.5 tablets (12.5 mg total) by mouth daily. 45 tablet 0   No current facility-administered medications for this visit.     Past Medical History  Diagnosis Date  . 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  . Cardiac defibrillator in situ     s/p MDT Maximo VR single lead ICD - 07/06/2006  . Hyperlipidemia   . Hypertension   . Ischemic cardiomyopathy     EF 40% 12/2008 V gram;  Echocardiogram 11/08/11: EF 30-35%, inferoseptal and anteroseptal, apical septal/lateral/anterior/inferior and true apical akinesis, no LV thrombus, grade 1 diastolic dysfunction.  Marland Kitchen History of orthostatic hypotension   . Degenerative joint disease   . Hypothyroidism   . Premature atrial contractions   . Premature ventricular contractions   . Systolic CHF, chronic   . Lumbar spondylosis     s/p L5-S1 fusion 12/11/2009  . Skin cancer     skin  . H/O hiatal hernia     times 3    ROS:   All systems reviewed and negative except as noted in the HPI.   Past Surgical History  Procedure Laterality Date  . Icd      s/p MDT Maximo VR single lead ICD - 07/06/2006  . Lumbar fusion      s/p L5-S1 fusion 12/11/2009  . Back surgery    . Left heart catheterization with coronary angiogram [name] 11/09/2011    Procedure: LEFT HEART CATHETERIZATION WITH CORONARY ANGIOGRAM;  Surgeon: Josue Hector, MD;  Location: [facility] CATH LAB;  Service: Cardiovascular;  Laterality: [name];  . Ep implantable device [name] 04/25/2015    Procedure: ICD Generator Changeout;  Surgeon: Evans Lance, MD;  Location: [place] CV LAB;  Service: Cardiovascular;  Laterality: [name];     Family History  Problem Relation Age of Onset  . Pneumonia Mother     deceased 83  . Peripheral vascular disease Father     deceased 51     Social History   Social History  . Marital Status: Married    Spouse Name: [name]  . Number of Children: [name]  . Years of Education: [name]   Occupational History  . retired [name] [employer]   Social History Main Topics  . Smoking status: Never Smoker   . Smokeless tobacco: Not on file  . Alcohol Use: No  . Drug Use:  No  . Sexual Activity: No   Other Topics Concern  . Not on file   Social History Narrative   Active, walks regularly.     BP 136/70 mmHg  Pulse 68  Ht 5\' 8"  (1.727 m)  Wt 158 lb 12.8 oz (72.031 kg)  BMI 24.15 kg/m2  Physical Exam:  Well appearing 81 year old man, NAD HEENT: Unremarkable Neck:  7 cm JVD, no thyromegally Back:  No CVA tenderness Lungs:  Clear with no wheezes, rales, or rhonchi. Well healed ICD incision HEART:  Regular rate rhythm, no murmurs, no rubs, no clicks Abd:  soft, positive bowel sounds, no organomegally, no rebound, no guarding Ext:  2 plus pulses, no edema, no cyanosis, no clubbing Skin:  No rashes no nodules Neuro:  CN II through XII intact, motor grossly intact    Assess/Plan:

## 2015-08-13 NOTE — Assessment & Plan Note (Signed)
His symptoms are class 2. He will continue his current meds and maintain a low sodium diet.

## 2015-08-28 ENCOUNTER — Other Ambulatory Visit: Payer: Self-pay | Admitting: Internal Medicine

## 2015-08-28 NOTE — Telephone Encounter (Signed)
Send to pcp

## 2015-08-28 NOTE — Telephone Encounter (Signed)
Pcp is checking lipids. Ok to refill or defer to their office? Please advise. Thanks, MI

## 2015-08-30 ENCOUNTER — Other Ambulatory Visit: Payer: Self-pay | Admitting: *Deleted

## 2015-08-30 ENCOUNTER — Telehealth: Payer: Self-pay | Admitting: Cardiology

## 2015-08-30 MED ORDER — ATORVASTATIN CALCIUM 40 MG PO TABS: 40.0000 mg | ORAL_TABLET | Freq: Every day | ORAL | Status: DC

## 2015-08-30 NOTE — Telephone Encounter (Signed)
°  1. Which medications need to be refilled? Atorvastatin  2. Which pharmacy is medication to be sent to?[facility]  3. Do they need a 30 day or 90 day supply? 90 and refills 3. Would they like a call back once the medication has been sent to the pharmacy? no

## 2015-09-06 ENCOUNTER — Ambulatory Visit (INDEPENDENT_AMBULATORY_CARE_PROVIDER_SITE_OTHER): Payer: Medicare Other | Admitting: Internal Medicine

## 2015-09-06 VITALS — Temp 98.0°F

## 2015-09-06 DIAGNOSIS — Z23 Encounter for immunization: Secondary | ICD-10-CM | POA: Diagnosis not present

## 2015-10-01 ENCOUNTER — Other Ambulatory Visit: Payer: Self-pay | Admitting: Internal Medicine

## 2015-10-07 DIAGNOSIS — H6122 Impacted cerumen, left ear: Secondary | ICD-10-CM | POA: Diagnosis not present

## 2015-10-21 ENCOUNTER — Other Ambulatory Visit: Payer: Self-pay | Admitting: Internal Medicine

## 2015-10-21 ENCOUNTER — Other Ambulatory Visit: Payer: Medicare Other | Admitting: Internal Medicine

## 2015-10-21 DIAGNOSIS — E039 Hypothyroidism, unspecified: Secondary | ICD-10-CM | POA: Diagnosis not present

## 2015-10-21 DIAGNOSIS — E785 Hyperlipidemia, unspecified: Secondary | ICD-10-CM

## 2015-10-21 DIAGNOSIS — I10 Essential (primary) hypertension: Secondary | ICD-10-CM | POA: Diagnosis not present

## 2015-10-21 DIAGNOSIS — R7302 Impaired glucose tolerance (oral): Secondary | ICD-10-CM | POA: Diagnosis not present

## 2015-10-21 DIAGNOSIS — E009 Congenital iodine-deficiency syndrome, unspecified: Secondary | ICD-10-CM | POA: Diagnosis not present

## 2015-10-21 LAB — LIPID PANEL
Cholesterol: 146 mg/dL (ref 125–200)
HDL: 77 mg/dL (ref >=40)
LDL Cholesterol: 58 mg/dL (ref <130)
Total CHOL/HDL Ratio: 1.9 Ratio (ref <=5.0)
Triglycerides: 56 mg/dL (ref <150)
VLDL: 11 mg/dL (ref <30)

## 2015-10-21 LAB — HEPATIC FUNCTION PANEL
ALT: 9 U/L (ref 9–46)
AST: 16 U/L (ref 10–35)
Albumin: 4.2 g/dL (ref 3.6–5.1)
Alkaline Phosphatase: 83 U/L (ref 40–115)
BILIRUBIN DIRECT: 0.2 mg/dL (ref <=0.2)
BILIRUBIN INDIRECT: 0.6 mg/dL (ref 0.2–1.2)
BILIRUBIN TOTAL: 0.8 mg/dL (ref 0.2–1.2)
TOTAL PROTEIN: 6.4 g/dL (ref 6.1–8.1)

## 2015-10-21 LAB — TSH: TSH: 1.904 u[IU]/mL (ref 0.350–4.500)

## 2015-10-22 ENCOUNTER — Encounter: Payer: Self-pay | Admitting: Internal Medicine

## 2015-10-22 ENCOUNTER — Ambulatory Visit (INDEPENDENT_AMBULATORY_CARE_PROVIDER_SITE_OTHER): Payer: Medicare Other | Admitting: Internal Medicine

## 2015-10-22 VITALS — BP 126/70 | HR 72 | Temp 97.0°F | Resp 20 | Ht 68.0 in | Wt 156.0 lb

## 2015-10-22 DIAGNOSIS — Z23 Encounter for immunization: Secondary | ICD-10-CM | POA: Diagnosis not present

## 2015-10-22 DIAGNOSIS — E039 Hypothyroidism, unspecified: Secondary | ICD-10-CM

## 2015-10-22 DIAGNOSIS — I259 Chronic ischemic heart disease, unspecified: Secondary | ICD-10-CM

## 2015-10-22 DIAGNOSIS — R7302 Impaired glucose tolerance (oral): Secondary | ICD-10-CM

## 2015-10-22 DIAGNOSIS — I10 Essential (primary) hypertension: Secondary | ICD-10-CM | POA: Diagnosis not present

## 2015-10-22 DIAGNOSIS — I519 Heart disease, unspecified: Secondary | ICD-10-CM

## 2015-10-22 DIAGNOSIS — I5189 Other ill-defined heart diseases: Secondary | ICD-10-CM

## 2015-10-22 DIAGNOSIS — E785 Hyperlipidemia, unspecified: Secondary | ICD-10-CM

## 2015-10-22 DIAGNOSIS — I5022 Chronic systolic (congestive) heart failure: Secondary | ICD-10-CM | POA: Diagnosis not present

## 2015-10-22 LAB — HEMOGLOBIN A1C
Hgb A1c MFr Bld: 5.8 % — ABNORMAL HIGH (ref <5.7)
MEAN PLASMA GLUCOSE: 120 mg/dL — AB (ref <117)

## 2015-10-22 MED ORDER — LEVOTHYROXINE SODIUM 50 MCG PO TABS: 50.0000 ug | ORAL_TABLET | ORAL | Status: DC

## 2015-10-22 NOTE — Progress Notes (Signed)
   Subjective:    Patient ID: Corey Jordan, male    DOB: [birth year], 81 y.o.   MRN: [id number]  HPI 81 year old White male with history of coronary disease and has implantable cardiac defibrillator with recent generator change out in today for six-month recheck. Has history of hypothyroidism and impaired glucose tolerance. TSH is within normal limits. Lipid panel liver functions are both normal. Says he has occasional swelling of his ankles but not very often. Weight is up 5 pounds from last visit. No chest pain. No shortness of breath. Flu vaccine given today.   Review of Systems     Objective:   Physical Exam  Skin warm and dry. Nodes none. Chest is clear to auscultation without rales or wheezing. Cardiac exam regular rate and rhythm normal S1 and S2. Extremities without edema whatsoever      Assessment & Plan:  Coronary disease-chronic ischemic heart disease  Internal cardiac defibrillator  Hypothyroidism-TSH stable on thyroid replacement medication  Essential hypertension  Hyperlipidemia-lipid panel liver function stable  Impaired glucose tolerance-hemoglobin A1c added today  Diastolic dysfunction  History of GE reflux-stable  Chronic systolic heart failure-stable followed by cardiologist  Plan: Levothyroxin refilled as requested. Flu vaccine given. Return in 6 months for physical examination. Check hemoglobin A1c.

## 2015-10-22 NOTE — Patient Instructions (Signed)
Continue same medications and return in 6 months. Levothyroxin refilled. Flu vaccine given. Hemoglobin A1c added to previous lab work. It was a pleasure to see you today.

## 2015-11-12 ENCOUNTER — Ambulatory Visit (INDEPENDENT_AMBULATORY_CARE_PROVIDER_SITE_OTHER): Payer: Medicare Other | Admitting: *Deleted

## 2015-11-12 DIAGNOSIS — I255 Ischemic cardiomyopathy: Secondary | ICD-10-CM

## 2015-11-12 NOTE — Progress Notes (Signed)
Remote ICD transmission.   

## 2015-11-15 ENCOUNTER — Encounter: Payer: Self-pay | Admitting: Cardiology

## 2015-11-15 LAB — CUP PACEART REMOTE DEVICE CHECK
Brady Statistic RV Percent Paced: 0.13 %
Date Time Interrogation Session: 20161220083423
HIGH POWER IMPEDANCE MEASURED VALUE: 41 Ohm
HIGH POWER IMPEDANCE MEASURED VALUE: 55 Ohm
Implantable Lead Implant Date: 20070814
Lead Channel Pacing Threshold Amplitude: 0.75 V
Lead Channel Pacing Threshold Pulse Width: 0.4 ms
Lead Channel Sensing Intrinsic Amplitude: 25 mV
Lead Channel Sensing Intrinsic Amplitude: 25 mV
Lead Channel Setting Pacing Pulse Width: 0.4 ms
MDC IDC LEAD LOCATION: 753860
MDC IDC MSMT BATTERY REMAINING LONGEVITY: 128 mo
MDC IDC MSMT BATTERY VOLTAGE: 3.03 V
MDC IDC MSMT LEADCHNL RV IMPEDANCE VALUE: 456 Ohm
MDC IDC MSMT LEADCHNL RV IMPEDANCE VALUE: 513 Ohm
MDC IDC SET LEADCHNL RV PACING AMPLITUDE: 2.5 V
MDC IDC SET LEADCHNL RV SENSING SENSITIVITY: 0.3 mV

## 2016-01-15 DIAGNOSIS — D1801 Hemangioma of skin and subcutaneous tissue: Secondary | ICD-10-CM | POA: Diagnosis not present

## 2016-01-15 DIAGNOSIS — L570 Actinic keratosis: Secondary | ICD-10-CM | POA: Diagnosis not present

## 2016-01-15 DIAGNOSIS — Z85828 Personal history of other malignant neoplasm of skin: Secondary | ICD-10-CM | POA: Diagnosis not present

## 2016-01-15 DIAGNOSIS — L821 Other seborrheic keratosis: Secondary | ICD-10-CM | POA: Diagnosis not present

## 2016-01-15 DIAGNOSIS — D485 Neoplasm of uncertain behavior of skin: Secondary | ICD-10-CM | POA: Diagnosis not present

## 2016-01-15 DIAGNOSIS — C4442 Squamous cell carcinoma of skin of scalp and neck: Secondary | ICD-10-CM | POA: Diagnosis not present

## 2016-01-27 ENCOUNTER — Other Ambulatory Visit: Payer: Self-pay | Admitting: *Deleted

## 2016-01-27 MED ORDER — CARVEDILOL 6.25 MG PO TABS: 6.2500 mg | ORAL_TABLET | Freq: Two times a day (BID) | ORAL | Status: DC

## 2016-01-29 DIAGNOSIS — Z85828 Personal history of other malignant neoplasm of skin: Secondary | ICD-10-CM | POA: Diagnosis not present

## 2016-01-29 DIAGNOSIS — D485 Neoplasm of uncertain behavior of skin: Secondary | ICD-10-CM | POA: Diagnosis not present

## 2016-01-29 DIAGNOSIS — D0439 Carcinoma in situ of skin of other parts of face: Secondary | ICD-10-CM | POA: Diagnosis not present

## 2016-01-29 DIAGNOSIS — D0422 Carcinoma in situ of skin of left ear and external auricular canal: Secondary | ICD-10-CM | POA: Diagnosis not present

## 2016-02-11 ENCOUNTER — Ambulatory Visit (INDEPENDENT_AMBULATORY_CARE_PROVIDER_SITE_OTHER): Payer: Medicare Other | Admitting: *Deleted

## 2016-02-11 DIAGNOSIS — I255 Ischemic cardiomyopathy: Secondary | ICD-10-CM

## 2016-02-12 NOTE — Progress Notes (Signed)
Remote ICD transmission.   

## 2016-03-10 DIAGNOSIS — H6122 Impacted cerumen, left ear: Secondary | ICD-10-CM | POA: Diagnosis not present

## 2016-03-12 LAB — CUP PACEART REMOTE DEVICE CHECK
Battery Remaining Longevity: 127 mo
Battery Voltage: 3.01 V
HighPow Impedance: 40 Ohm
HighPow Impedance: 54 Ohm
Implantable Lead Implant Date: 20070814
Implantable Lead Location: 753860
Implantable Lead Model: 6947
Lead Channel Impedance Value: 475 Ohm
Lead Channel Pacing Threshold Amplitude: 0.75 V
Lead Channel Pacing Threshold Pulse Width: 0.4 ms
Lead Channel Sensing Intrinsic Amplitude: 24.75 mV
Lead Channel Setting Pacing Amplitude: 2.5 V
Lead Channel Setting Sensing Sensitivity: 0.3 mV
MDC IDC MSMT LEADCHNL RV IMPEDANCE VALUE: 418 Ohm
MDC IDC MSMT LEADCHNL RV SENSING INTR AMPL: 24.75 mV
MDC IDC SESS DTM: 20170321071604
MDC IDC SET LEADCHNL RV PACING PULSEWIDTH: 0.4 ms
MDC IDC STAT BRADY RV PERCENT PACED: 0.1 %

## 2016-03-13 ENCOUNTER — Encounter: Payer: Self-pay | Admitting: Cardiology

## 2016-04-16 ENCOUNTER — Other Ambulatory Visit: Payer: Medicare Other | Admitting: Internal Medicine

## 2016-04-16 DIAGNOSIS — E039 Hypothyroidism, unspecified: Secondary | ICD-10-CM | POA: Diagnosis not present

## 2016-04-16 DIAGNOSIS — Z87898 Personal history of other specified conditions: Secondary | ICD-10-CM | POA: Diagnosis not present

## 2016-04-16 DIAGNOSIS — E785 Hyperlipidemia, unspecified: Secondary | ICD-10-CM | POA: Diagnosis not present

## 2016-04-16 DIAGNOSIS — Z0000 Encounter for general adult medical examination without abnormal findings: Secondary | ICD-10-CM

## 2016-04-16 DIAGNOSIS — I10 Essential (primary) hypertension: Secondary | ICD-10-CM | POA: Diagnosis not present

## 2016-04-16 DIAGNOSIS — R7302 Impaired glucose tolerance (oral): Secondary | ICD-10-CM | POA: Diagnosis not present

## 2016-04-16 DIAGNOSIS — N400 Enlarged prostate without lower urinary tract symptoms: Secondary | ICD-10-CM | POA: Diagnosis not present

## 2016-04-17 ENCOUNTER — Other Ambulatory Visit: Payer: Medicare Other | Admitting: Internal Medicine

## 2016-04-17 LAB — CBC WITH DIFFERENTIAL/PLATELET
BASOS ABS: 0 {cells}/uL (ref 0–200)
BASOS PCT: 0 %
EOS PCT: 2 %
Eosinophils Absolute: 122 cells/uL (ref 15–500)
HCT: 40.1 % (ref 38.5–50.0)
Hemoglobin: 12.9 g/dL — ABNORMAL LOW (ref 13.2–17.1)
Lymphocytes Relative: 15 %
Lymphs Abs: 915 cells/uL (ref 850–3900)
MCH: 29.6 pg (ref 27.0–33.0)
MCHC: 32.2 g/dL (ref 32.0–36.0)
MCV: 92 fL (ref 80.0–100.0)
MONOS PCT: 11 %
MPV: 10.6 fL (ref 7.5–12.5)
Monocytes Absolute: 671 cells/uL (ref 200–950)
NEUTROS ABS: 4392 {cells}/uL (ref 1500–7800)
Neutrophils Relative %: 72 %
PLATELETS: 187 10*3/uL (ref 140–400)
RBC: 4.36 MIL/uL (ref 4.20–5.80)
RDW: 13.7 % (ref 11.0–15.0)
WBC: 6.1 10*3/uL (ref 3.8–10.8)

## 2016-04-17 LAB — COMPLETE METABOLIC PANEL WITH GFR
ALT: 10 U/L (ref 9–46)
AST: 17 U/L (ref 10–35)
Albumin: 4 g/dL (ref 3.6–5.1)
Alkaline Phosphatase: 81 U/L (ref 40–115)
BILIRUBIN TOTAL: 0.6 mg/dL (ref 0.2–1.2)
BUN: 12 mg/dL (ref 7–25)
CO2: 29 mmol/L (ref 20–31)
Calcium: 9.7 mg/dL (ref 8.6–10.3)
Chloride: 99 mmol/L (ref 98–110)
Creat: 1.02 mg/dL (ref 0.70–1.11)
GFR, EST NON AFRICAN AMERICAN: 69 mL/min (ref >=60)
GFR, Est African American: 80 mL/min (ref >=60)
GLUCOSE: 84 mg/dL (ref 65–99)
POTASSIUM: 4.4 mmol/L (ref 3.5–5.3)
SODIUM: 137 mmol/L (ref 135–146)
TOTAL PROTEIN: 5.9 g/dL — AB (ref 6.1–8.1)

## 2016-04-17 LAB — HEMOGLOBIN A1C
HEMOGLOBIN A1C: 5.8 % — AB (ref <5.7)
Mean Plasma Glucose: 120 mg/dL

## 2016-04-17 LAB — LIPID PANEL
CHOL/HDL RATIO: 1.8 ratio (ref <=5.0)
Cholesterol: 139 mg/dL (ref 125–200)
HDL: 79 mg/dL (ref >=40)
LDL CALC: 51 mg/dL (ref <130)
Triglycerides: 45 mg/dL (ref <150)
VLDL: 9 mg/dL (ref <30)

## 2016-04-17 LAB — MICROALBUMIN, URINE: Microalb, Ur: 0.5 mg/dL

## 2016-04-17 LAB — PSA: PSA: 3.85 ng/mL (ref <=4.00)

## 2016-04-17 LAB — TSH: TSH: 2.09 mIU/L (ref 0.40–4.50)

## 2016-04-21 ENCOUNTER — Ambulatory Visit (INDEPENDENT_AMBULATORY_CARE_PROVIDER_SITE_OTHER): Payer: Medicare Other | Admitting: Internal Medicine

## 2016-04-21 ENCOUNTER — Encounter: Payer: Self-pay | Admitting: Internal Medicine

## 2016-04-21 VITALS — BP 130/82 | HR 66 | Temp 97.3°F | Resp 18 | Ht 68.0 in | Wt 154.0 lb

## 2016-04-21 DIAGNOSIS — I255 Ischemic cardiomyopathy: Secondary | ICD-10-CM | POA: Diagnosis not present

## 2016-04-21 DIAGNOSIS — I10 Essential (primary) hypertension: Secondary | ICD-10-CM | POA: Diagnosis not present

## 2016-04-21 DIAGNOSIS — Z95810 Presence of automatic (implantable) cardiac defibrillator: Secondary | ICD-10-CM

## 2016-04-21 DIAGNOSIS — K219 Gastro-esophageal reflux disease without esophagitis: Secondary | ICD-10-CM

## 2016-04-21 DIAGNOSIS — E785 Hyperlipidemia, unspecified: Secondary | ICD-10-CM | POA: Diagnosis not present

## 2016-04-21 DIAGNOSIS — Z8679 Personal history of other diseases of the circulatory system: Secondary | ICD-10-CM | POA: Diagnosis not present

## 2016-04-21 DIAGNOSIS — E039 Hypothyroidism, unspecified: Secondary | ICD-10-CM

## 2016-04-21 DIAGNOSIS — I252 Old myocardial infarction: Secondary | ICD-10-CM | POA: Diagnosis not present

## 2016-04-21 DIAGNOSIS — Z0000 Encounter for general adult medical examination without abnormal findings: Secondary | ICD-10-CM | POA: Diagnosis not present

## 2016-04-21 DIAGNOSIS — R7302 Impaired glucose tolerance (oral): Secondary | ICD-10-CM | POA: Diagnosis not present

## 2016-04-21 LAB — POCT URINALYSIS DIPSTICK
BILIRUBIN UA: NEGATIVE
GLUCOSE UA: NEGATIVE
KETONES UA: NEGATIVE
Leukocytes, UA: NEGATIVE
NITRITE UA: NEGATIVE
PH UA: 6.5
Protein, UA: NEGATIVE
RBC UA: NEGATIVE
SPEC GRAV UA: 1.02
Urobilinogen, UA: 0.2

## 2016-04-21 NOTE — Progress Notes (Signed)
Subjective:    Patient ID: Corey Jordan, male    DOB: [birth year], 82 y.o.   MRN: [id number]  HPI 82 year old White male with complex cardiac history has appointments to see Cardiologists in July in today for health maintenance exam and evaluation of medical problems. He has internal cardiac defibrillator followed by Dr. Lovena Le. Dr. Stanford Breed is his general cardiologist. He has a history of chronic ischemic heart disease. History of lumbar disc disease, hypertension, hypothyroidism, hyperlipidemia, GE reflux.  History of ischemic cardiomyopathy with ejection fraction of 30% on cardiac catheterization August 2007. History of class II congestive heart failure. History of implantation of Medtronic cardioverter defibrillator August 2007. Generator was changed out June 2016.  Patient had myocardial infarction in 2004 with total occlusion of the LAD which was stented 2. Right coronary artery had 40% proximal lesion. Circumflex was normal. Patient had 80% diagonal lesion. Was noted at the time to have ejection fraction between 30 and 35%.  History of spondylosis and herniated disc with left radiculopathy L5 and S1 status post L5-S1 fusion by Dr. Carloyn Manner in January 2011.  Colonoscopy with Dr. Deatra Ina October 2005. He's now 82 years old. I gave him 3 Hemoccult cards to bring back to office.  History of left inguinal hernia repair by Dr. Ottis Stain summer of 2000  Zostavax vaccine 2011, tetanus immunization 2011, Prevnar and Pneumovax immunizations are up-to-date.  Social history: He is retired from the [employer]. Married with 2 adult children. Does not smoke or consume alcohol.  Family history: Mother died of pneumonia at age 72. Father died of heart failure at age 92. Father had history of venous thrombosis apparently had a living in dictation. Brother died at age 39 of cancer. .    Review of Systems no issues with BPH, no GI concerns, slight lower extremity edema swelling from time to time, no shortness  of breath or chest pain     Objective:   Physical Exam  Constitutional: He is oriented to person, place, and time. He appears well-developed and well-nourished. No distress.  HENT:  Head: Normocephalic and atraumatic.  Right Ear: External ear normal.  Left Ear: External ear normal.  Mouth/Throat: Oropharynx is clear and moist. No oropharyngeal exudate.  Eyes: Conjunctivae and EOM are normal. Pupils are equal, round, and reactive to light. Right eye exhibits no discharge. Left eye exhibits no discharge. No scleral icterus.  Neck: Neck supple. No JVD present. No thyromegaly present.  Cardiovascular: Normal rate, regular rhythm, normal heart sounds and intact distal pulses.   No murmur heard. Pulmonary/Chest: Effort normal and breath sounds normal. No respiratory distress. He has no wheezes. He has no rales.  Abdominal: Soft. Bowel sounds are normal. He exhibits no distension and no mass. There is no tenderness. There is no rebound and no guarding.  Genitourinary: Prostate normal.  Musculoskeletal: He exhibits no edema.  Lymphadenopathy:    He has no cervical adenopathy.  Neurological: He is alert and oriented to person, place, and time. He has normal reflexes. No cranial nerve deficit. Coordination normal.  Skin: Skin is warm and dry. No rash noted. He is not diaphoretic.  Psychiatric: He has a normal mood and affect. His behavior is normal. Judgment and thought content normal.  Vitals reviewed.         Assessment & Plan:  Ischemic cardiomyopathy-Followed by cardiology. Has defibrillator  History of MI 2004 with occluded LAD which was stented 2  History of L5-S1 fusion  Essential hypertension-BP stable  Hypothyroidism-TSH within normal  limits on current medication  Hyperlipidemia-lipid panel within normal limits on statin  GE reflux  Lumbar disc disease  Impaired glucose tolerance-hemoglobin A1c 5.8%  Plan: Patient is to continue same medications and return in 6  months. 3 Hemoccult cards given. Has cardiology appointments in July. He continues to drive.  Subjective:   Patient presents for Medicare Annual/Subsequent preventive examination.  Review Past Medical/Family/Social:See above   Risk Factors  Current exercise habits: Sedentary for the most part-does walk some. Dietary issues discussed: Low fat low carbohydrate  Cardiac risk factors: Father died of heart failure at age 85. Hyperlipidemia, impaired glucose tolerance  Depression Screen  (Note: if answer to either of the following is "Yes", a more complete depression screening is indicated)   Over the past two weeks, have you felt down, depressed or hopeless? No  Over the past two weeks, have you felt little interest or pleasure in doing things? No Have you lost interest or pleasure in daily life? No Do you often feel hopeless? No Do you cry easily over simple problems? No   Activities of Daily Living  In your present state of health, do you have any difficulty performing the following activities?:   Driving? No  Managing money? No  Feeding yourself? No  Getting from bed to chair? No  Climbing a flight of stairs? yes Preparing food and eating?: No  Bathing or showering? No  Getting dressed: No  Getting to the toilet? No  Using the toilet:No  Moving around from place to place: No  In the past year have you fallen or had a near fall?:yes Are you sexually active? yes Do you have more than one partner? No   Hearing Difficulties: No  Do you often ask people to speak up or repeat themselves? No  Do you experience ringing or noises in your ears? No  Do you have difficulty understanding soft or whispered voices? No  Do you feel that you have a problem with memory? No Do you often misplace items? No    Home Safety:  Do you have a smoke alarm at your residence? Yes Do you have grab bars in the bathroom? yes Do you have throw rugs in your house? no   Cognitive Testing  Alert?  Yes Normal Appearance?Yes  Oriented to person? Yes Place? Yes  Time? Yes  Recall of three objects? Yes  Can perform simple calculations? Yes  Displays appropriate judgment?Yes  Can read the correct time from a watch face?Yes   List the Names of Other Physician/Practitioners you currently use:  See referral list for the physicians patient is currently seeing.  Cardiologist   Review of Systems: See above   Objective:     General appearance: Appears stated age and thin Head: Normocephalic, without obvious abnormality, atraumatic  Eyes: conj clear, EOMi PEERLA  Ears: normal TM's and external ear canals both ears  Nose: Nares normal. Septum midline. Mucosa normal. No drainage or sinus tenderness.  Throat: lips, mucosa, and tongue normal; teeth and gums normal  Neck: no adenopathy, no carotid bruit, no JVD, supple, symmetrical, trachea midline and thyroid not enlarged, symmetric, no tenderness/mass/nodules  No CVA tenderness.  Lungs: clear to auscultation bilaterally  Breasts: normal appearance, no masses or tenderness.Defibrillator present. Heart: regular rate and rhythm, S1, S2 normal, no murmur, click, rub or gallop  Abdomen: soft, non-tender; bowel sounds normal; no masses, no organomegaly  Musculoskeletal: ROM normal in all joints, no crepitus, no deformity, Normal muscle strengthen. Back  is symmetric,  no curvature. Skin: Skin color, texture, turgor normal. No rashes or lesions  Lymph nodes: Cervical, supraclavicular, and axillary nodes normal.  Neurologic: CN 2 -12 Normal, Normal symmetric reflexes. Normal coordination and gait  Psych: Alert & Oriented x 3, Mood appear stable.    Assessment:    Annual wellness medicare exam   Plan:    During the course of the visit the patient was educated and counseled about appropriate screening and preventive services including:   Annual flu vaccine     Patient Instructions (the written plan) was given to the patient.  Medicare  Attestation  I have personally reviewed:  The patient's medical and social history  Their use of alcohol, tobacco or illicit drugs  Their current medications and supplements  The patient's functional ability including ADLs,fall risks, home safety risks, cognitive, and hearing and visual impairment  Diet and physical activities  Evidence for depression or mood disorders  The patient's weight, height, BMI, and visual acuity have been recorded in the chart. I have made referrals, counseling, and provided education to the patient based on review of the above and I have provided the patient with a written personalized care plan for preventive services.

## 2016-04-21 NOTE — Patient Instructions (Signed)
It was a pleasure to see you today. Please continue same medications and return in 6 months. 3 Hemoccult cards given for return to office.

## 2016-04-27 ENCOUNTER — Other Ambulatory Visit: Payer: Self-pay | Admitting: Cardiology

## 2016-04-27 NOTE — Telephone Encounter (Signed)
Rx(s) sent to pharmacy electronically.  

## 2016-04-29 ENCOUNTER — Other Ambulatory Visit: Payer: Self-pay | Admitting: Nurse Practitioner

## 2016-05-04 ENCOUNTER — Other Ambulatory Visit (INDEPENDENT_AMBULATORY_CARE_PROVIDER_SITE_OTHER): Payer: Medicare Other | Admitting: Internal Medicine

## 2016-05-04 DIAGNOSIS — Z1211 Encounter for screening for malignant neoplasm of colon: Secondary | ICD-10-CM | POA: Diagnosis not present

## 2016-05-04 LAB — HEMOCCULT GUIAC POC 1CARD (OFFICE)
Card #3 Fecal Occult Blood, POC: POSITIVE
FECAL OCCULT BLD: POSITIVE
FECAL OCCULT BLD: POSITIVE — AB

## 2016-05-06 ENCOUNTER — Telehealth: Payer: Self-pay | Admitting: Internal Medicine

## 2016-05-06 ENCOUNTER — Encounter: Payer: Self-pay | Admitting: Gastroenterology

## 2016-05-06 DIAGNOSIS — R195 Other fecal abnormalities: Secondary | ICD-10-CM

## 2016-05-06 NOTE — Telephone Encounter (Signed)
Referral sent to [facility] 

## 2016-05-06 NOTE — Telephone Encounter (Signed)
Please refer Mr.Luttrull to [facility].   Dr. Deatra Ina has left [facility] so he will need to see someone else.

## 2016-05-06 NOTE — Telephone Encounter (Signed)
Patient returning the call to [facility]; states that he does take Vitamin D every day 800 mg, Calcium 600mg  every day.  He had not been off of that for 3 days prior to providing these stools samples.  He can't be 100% sure that he didn't have red meat, but he doesn't think that he did.  Advised patient that we would set up a GI referral for him with Dr. Deatra Ina for a colonoscopy consult as his last was from 2005.  Patient advised that all samples were positive for blood in the stool and that's why we would be sending him for GI consult.  Patient verbalized understanding of our conversation.  DeAnna, would you please refer [name] to Dr. Deatra Ina?    Thank you.

## 2016-05-25 NOTE — Progress Notes (Signed)
HPI: FU coronary artery disease. Abdominal CT in 2005 showed no aneurysm. Patient had a myocardial infarction in 2004. He had PCI of his LAD. His ejection fraction was 30%. Last catheterization in 2007 showed patent stents in the LAD. There is an 80% ostial first diagonal, occluded second diagonal and normal circumflex and right coronary artery. Ejection fraction was 30%. Patient had an ICD placed. Last echocardiogram in December 2012 showed an ejection fraction of 30-35%. There was grade 1 diastolic dysfunction. There was mild left atrial enlargement. Nuclear study May 2015 showed ejection fraction 34%. There was scar with mild peri-infarct ischemia. He has been treated medically. Since he was last seen, the patient has dyspnea with more extreme activities but not with routine activities. It is relieved with rest. It is not associated with chest pain. There is no orthopnea, PND or pedal edema. There is no syncope or palpitations. There is no exertional chest pain.    Current Outpatient Prescriptions  Medication Sig Dispense Refill  . aspirin 81 MG EC tablet Take 81 mg by mouth daily.      Marland Kitchen atorvastatin (LIPITOR) 40 MG tablet Take 1 tablet (40 mg total) by mouth daily. 90 tablet 3  . calcium carbonate (OS-CAL) 600 MG TABS Take 600 mg by mouth daily.     . carvedilol (COREG) 6.25 MG tablet Take 1 tablet (6.25 mg total) by mouth 2 (two) times daily with a meal. 180 tablet 3  . cholecalciferol (VITAMIN D) 1000 UNITS tablet Take 1,000 Units by mouth daily.     . furosemide (LASIX) 20 MG tablet Take 20 mg by mouth daily as needed for fluid.    . furosemide (LASIX) 20 MG tablet take 1 tablet by mouth once daily if needed for fluid retention 30 tablet 2  . levothyroxine (SYNTHROID, LEVOTHROID) 50 MCG tablet Take 1 tablet (50 mcg total) by mouth every morning. 90 tablet 3  . nitroGLYCERIN (NITROSTAT) 0.4 MG SL tablet Place 1 tablet (0.4 mg total) under the tongue every 5 (five) minutes as needed. For  chest pain 25 tablet 6  . omeprazole (PRILOSEC) 20 MG capsule Take 20 mg by mouth daily as needed (heart burn).    . ramipril (ALTACE) 5 MG capsule TAKE 1 CAPSULE DAILY 90 capsule 3  . spironolactone (ALDACTONE) 25 MG tablet TAKE 1/2 TABLET (=12.5MG )  DAILY 45 tablet 7   No current facility-administered medications for this visit.     Past Medical History  Diagnosis Date  . 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  . Cardiac defibrillator in situ     s/p MDT Maximo VR single lead ICD - 07/06/2006  . Hyperlipidemia   . Hypertension   . Ischemic cardiomyopathy     EF 40% 12/2008 V gram;  Echocardiogram 11/08/11: EF 30-35%, inferoseptal and anteroseptal, apical septal/lateral/anterior/inferior and true apical akinesis, no LV thrombus, grade 1 diastolic dysfunction.  Marland Kitchen History of orthostatic hypotension   . Degenerative joint disease   . Hypothyroidism   . Premature atrial contractions   . Premature ventricular contractions   . Systolic CHF, chronic ([place])   . Lumbar spondylosis     s/p L5-S1 fusion  12/11/2009  . Skin cancer     skin  . H/O hiatal hernia     times 3    Past Surgical History  Procedure Laterality Date  . Icd      s/p MDT Maximo VR single lead ICD - 07/06/2006  . Lumbar fusion      s/p L5-S1 fusion 12/11/2009  . Back surgery    . Left heart catheterization with coronary angiogram [name] 11/09/2011    Procedure: LEFT HEART CATHETERIZATION WITH CORONARY ANGIOGRAM;  Surgeon: Josue Hector, MD;  Location: [facility] CATH LAB;  Service: Cardiovascular;  Laterality: [name];  . Ep implantable device [name] 04/25/2015    Procedure: ICD Generator Changeout;  Surgeon: Evans Lance, MD;  Location: [place] [facility] LAB;  Service: Cardiovascular;  Laterality: [name];    Social History   Social History  . Marital Status: Married    Spouse Name: [name]  . Number of Children: [name]  . Years of Education: [name]   Occupational History  . retired [name]   Social History Main Topics  . Smoking status: Never Smoker   . Smokeless tobacco: Not on file  . Alcohol Use: No  . Drug Use: No  . Sexual Activity: No   Other Topics Concern  . Not on file   Social History Narrative   Active, walks regularly.    Family History  Problem Relation Age of Onset  . Pneumonia Mother     deceased 45  . Peripheral vascular disease Father     deceased 36    ROS: no fevers or chills, productive cough, hemoptysis, dysphasia, odynophagia, melena, hematochezia, dysuria, hematuria, rash, seizure activity, orthopnea, PND, pedal edema, claudication. Remaining systems are negative.  Physical Exam: Well-developed well-nourished in no acute distress.  Skin is warm and dry.  HEENT is normal.  Neck is supple.  Chest is clear to auscultation with normal expansion.  Cardiovascular exam is regular rate and rhythm.  Abdominal exam nontender or distended. No masses palpated. Extremities show no edema. neuro grossly intact  ECG Sinus rhythm at a rate of 64. Anterior T-wave inversion.   Assessment and plan  1 chronic systolic congestive heart failure-patient appears to be euvolemic on examination. Continue present dose of diuretics. 2 coronary artery disease-continue aspirin and statin. 3 ICD-management per electrophysiology. 4 hyperlipidemia-continue statin. Lipids and liver monitored by primary care. 5 hypertension-Blood pressure mildly elevated. However he follows this at home and his systolics typically is [id number]. He had low blood pressure with higher doses of cardiac medications in the past. I therefore will not advance. Potassium and renal function monitored by primary care. 6 ischemic  cardiomyopathy-continue beta blocker and ACE inhibitor.  Kirk Ruths, MD

## 2016-05-26 NOTE — Progress Notes (Signed)
Cardiology Office Note Date:  05/27/2016  Patient ID:  Corey Jordan, Corey Jordan 1934/10/09, MRN [id number] PCP:  Elby Showers, MD  Cardiologist:  Dr. Stanford Breed Electrophysiologist: Dr. Lovena Le   Chief Complaint: routine pacer/EP visit  History of Present Illness: Corey Jordan is a 82 y.o. male with history of ICM, chronic systolic CHF w/ICD implant, CAD (last PCI 2004), HTN, HLD, and  Hypothyroidism comes to the office today to be seen for Dr. Lovena Le.  Last seen by him eSept 2016, at that time doing well from an EP standpoint.  He is feeling quite well, he denies nay kind of CP, palpitations, rarely when in bed he feels the need to breathe through his mouth, sleeps with one pillow, doesn't feels SOB, only that he feels he gets better air breathing throughhis mouth, no PND symptoms, no dizziness, near syncope or syncope.  He has not been shocked.   ICD History: He reports has never been shocked by his device, (including the previous ICD)   Past Medical History  Diagnosis Date  . 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  . Cardiac defibrillator in situ     s/p MDT Maximo VR single lead ICD - 07/06/2006  . Hyperlipidemia   . Hypertension   . Ischemic cardiomyopathy     EF 40% 12/2008 V gram;  Echocardiogram 11/08/11: EF 30-35%, inferoseptal and anteroseptal, apical septal/lateral/anterior/inferior and true apical akinesis, no LV thrombus, grade 1 diastolic dysfunction.  Marland Kitchen History of orthostatic hypotension   . Degenerative joint disease   . Hypothyroidism   . Premature atrial contractions   . Premature ventricular contractions   . Systolic CHF, chronic ([place])   . Lumbar spondylosis     s/p L5-S1  fusion 12/11/2009  . Skin cancer     skin  . H/O hiatal hernia     times 3    Past Surgical History  Procedure Laterality Date  . Icd      s/p MDT Maximo VR single lead ICD - 07/06/2006  . Lumbar fusion      s/p L5-S1 fusion 12/11/2009  . Back surgery    . Left heart catheterization with coronary angiogram [name] 11/09/2011    Procedure: LEFT HEART CATHETERIZATION WITH CORONARY ANGIOGRAM;  Surgeon: Josue Hector, MD;  Location: [facility] CATH LAB;  Service: Cardiovascular;  Laterality: [name];  . Ep implantable device [name] 04/25/2015    Procedure: ICD Generator Changeout;  Surgeon: Evans Lance, MD;  Location: [place] CV LAB;  Service: Cardiovascular;  Laterality: [name];    Current Outpatient Prescriptions  Medication Sig Dispense Refill  . aspirin 81 MG EC tablet Take 81 mg by mouth daily.      Marland Kitchen atorvastatin (LIPITOR) 40 MG tablet Take 1 tablet (40 mg total) by mouth daily. 90 tablet 3  . calcium carbonate (OS-CAL) 600 MG TABS Take 600 mg by mouth daily.     . carvedilol (COREG) 6.25 MG tablet Take 1 tablet (6.25 mg total) by mouth 2 (two) times daily with a meal. 180 tablet 3  . cholecalciferol (VITAMIN D) 1000 UNITS tablet Take 1,000 Units by mouth daily.     . furosemide (LASIX) 20 MG  tablet Take 20 mg by mouth daily as needed for fluid.    Marland Kitchen levothyroxine (SYNTHROID, LEVOTHROID) 50 MCG tablet Take 1 tablet (50 mcg total) by mouth every morning. 90 tablet 3  . nitroGLYCERIN (NITROSTAT) 0.4 MG SL tablet Place 1 tablet (0.4 mg total) under the tongue every 5 (five) minutes as needed. For chest pain 25 tablet 6  . omeprazole (PRILOSEC) 20 MG capsule Take 20 mg by mouth daily as needed (heart burn).    . ramipril (ALTACE) 5 MG capsule TAKE 1 CAPSULE DAILY 90 capsule 3  . spironolactone (ALDACTONE) 25 MG tablet TAKE 1/2 TABLET (=12.5MG )  DAILY 45 tablet 7   No current facility-administered medications for this visit.    Allergies:   Pollen extract   Social History:  The patient  reports that  he has never smoked. He does not have any smokeless tobacco history on file. He reports that he does not drink alcohol or use illicit drugs.   Family History:  The patient's family history includes Peripheral vascular disease in his father; Pneumonia in his mother.  ROS:  Please see the history of present illness.  All other systems are reviewed and otherwise negative.   PHYSICAL EXAM:  VS:  BP 138/74 mmHg  Pulse 68  Ht 5' 7.5" (1.715 m)  Wt 154 lb (69.854 kg)  BMI 23.75 kg/m2 BMI: Body mass index is 23.75 kg/(m^2). Well nourished, well developed, in no acute distress HEENT: normocephalic, atraumatic Neck: no JVD, carotid bruits or masses Cardiac:  RRR; no significant murmurs, no rubs, or gallops Lungs:  clear to auscultation bilaterally, no wheezing, rhonchi or rales Abd: soft, nontender MS: no deformity or atrophy Ext: no edema Skin: warm and dry, no rash Neuro:  No gross deficits appreciated Psych: euthymic mood, full affect  ICD site is stable, no tethering or discomfort   EKG:  Not done today, the patient prefers to have done when seeing Dr. Stanford Breed Friday ICD interrogation today: normal device function, one 4beat NSVT only, battery status is good.  04/19/14: Lexiscan stress test Impression Exercise Capacity: Lexiscan with no exercise. BP Response: Normal blood pressure response. Clinical Symptoms: Lightheadedness. ECG Impression: No significant ST segment change suggestive of ischemia. Comparison with Prior Nuclear Study: tehre is significant change when compared to the prior study in 2006 that was interpreted as anteroapical scar.  Overall Impression: High risk stress nuclear study with a large scar in the mid LAD and RCA territory with mild periinfarct ischeia in the basal and LCX/OM territory (SDS 4). If clinically indicated a viability study like cardiac MRI should be considered. . LV Ejection Fraction: 34%. LV Wall Motion: akinesis in the entire inferior wall  and dyskinesis of all the apical segments including the true apex.   11/08/11: Echocardiogram Study Conclusions - Left ventricle: The cavity size was normal. Wall thickness was normal. Systolic function was moderately to severely reduced. The estimated ejection fraction was in the range of 30% to 35%. Akinesis of the mid inferoseptal and anteroseptal walls, the apical septal/lateral/anterior/inferior walls, the true apex. No LV thrombus noted. Doppler parameters are consistent with abnormal left ventricular relaxation (grade 1 diastolic dysfunction). - Aortic valve: There was no stenosis. - Mitral valve: No significant regurgitation. - Left atrium: The atrium was mildly dilated. - Right ventricle: The cavity size was normal. Pacer wire or catheter noted in right ventricle. Systolic function was normal. - Atrial septum: Atrial septal aneurysm. - Pulmonary arteries: No complete TR doppler jet so unable to estimate  PA systolic pressure. - Systemic veins: IVC not visualized. Impressions: - Normal LV size with peri-apical wall motion abnormalities as noted above and EF 30-35%. No significant valvular abnormalities. Normal RV size and systolic function.  Recent Labs: 04/16/2016: ALT 10; BUN 12; Creat 1.02; Hemoglobin 12.9*; Platelets 187; Potassium 4.4; Sodium 137; TSH 2.09  04/16/2016: Cholesterol 139; HDL 79; LDL Cholesterol 51; Total CHOL/HDL Ratio 1.8; Triglycerides 45; VLDL 9   CrCl cannot be calculated (Patient has no serum creatinine result on file.).   Wt Readings from Last 3 Encounters:  05/27/16 154 lb (69.854 kg)  04/21/16 154 lb (69.854 kg)  10/22/15 156 lb (70.761 kg)     Other studies reviewed: Additional studies/records reviewed today include: summarized above  DEVICE information: MDT single chamber ICD, implanted 07/06/06, gen change 04/25/15, Dr. Lovena Le, primary prevention patient reports never being shocked  ASSESSMENT AND PLAN: 1. ICM      exam appears compensated       weight stable     optiVol is below threshold, upwards trend, exam is negative for fluid OL and weight is stable     On BB/ARB, aldactone     Discussed enrolling in ICM with L. Short, RN but he would like to hold off on this  2. ICD     normal device function     Carelink Remote 3 months  3. CAD     no symptoms     On ASA, BB, statin     C/w Dr. Stanford Breed  4. HTN     stable  5. HLD     Labs look good  Disposition: F/u with Dr. Stanford Breed as planned, remote device check in 3 months, Dr. Lovena Le in 1 year, sooner if needed.  Current medicines are reviewed at length with the patient today.  The patient did not have any concerns regarding medicines.  Haywood Lasso, PA-C 05/27/2016 2:32 PM     [address] [place] [address] [phone] (office)  [phone] (fax)

## 2016-05-27 ENCOUNTER — Encounter: Payer: Self-pay | Admitting: Physician Assistant

## 2016-05-27 ENCOUNTER — Ambulatory Visit (INDEPENDENT_AMBULATORY_CARE_PROVIDER_SITE_OTHER): Payer: Medicare Other | Admitting: Physician Assistant

## 2016-05-27 VITALS — BP 138/74 | HR 68 | Ht 67.5 in | Wt 154.0 lb

## 2016-05-27 DIAGNOSIS — Z95810 Presence of automatic (implantable) cardiac defibrillator: Secondary | ICD-10-CM

## 2016-05-27 DIAGNOSIS — I255 Ischemic cardiomyopathy: Secondary | ICD-10-CM | POA: Diagnosis not present

## 2016-05-27 NOTE — Patient Instructions (Signed)
Medication Instructions:   Your physician recommends that you continue on your current medications as directed. Please refer to the Current Medication list given to you today.   If you need a refill on your cardiac medications before your next appointment, please call your pharmacy.  Labwork: NONE ORDER TODAY    Testing/Procedures: NONE ORDER TODAY   Follow-Up:  Your physician wants you to follow-up in: [place] will receive a reminder letter in the mail two months in advance. If you don't receive a letter, please call our office to schedule the follow-up appointment.  Remote monitoring is used to monitor your Pacemaker of ICD from home. This monitoring reduces the number of office visits required to check your device to one time per year. It allows [name] to keep an eye on the functioning of your device to ensure it is working properly. You are scheduled for a device check from home on . 08/26/2016..You may send your transmission at any time that day. If you have a wireless device, the transmission will be sent automatically. After your physician reviews your transmission, you will receive a postcard with your next transmission date.     Any Other Special Instructions Will Be Listed Below (If Applicable).

## 2016-05-29 ENCOUNTER — Ambulatory Visit (INDEPENDENT_AMBULATORY_CARE_PROVIDER_SITE_OTHER): Payer: Medicare Other | Admitting: Cardiology

## 2016-05-29 ENCOUNTER — Encounter: Payer: Self-pay | Admitting: Cardiology

## 2016-05-29 VITALS — BP 150/76 | HR 64 | Ht 67.0 in | Wt 153.0 lb

## 2016-05-29 DIAGNOSIS — I5022 Chronic systolic (congestive) heart failure: Secondary | ICD-10-CM | POA: Diagnosis not present

## 2016-05-29 DIAGNOSIS — I2510 Atherosclerotic heart disease of native coronary artery without angina pectoris: Secondary | ICD-10-CM | POA: Diagnosis not present

## 2016-05-29 DIAGNOSIS — I255 Ischemic cardiomyopathy: Secondary | ICD-10-CM

## 2016-05-29 DIAGNOSIS — I10 Essential (primary) hypertension: Secondary | ICD-10-CM | POA: Diagnosis not present

## 2016-05-29 DIAGNOSIS — E785 Hyperlipidemia, unspecified: Secondary | ICD-10-CM

## 2016-05-29 NOTE — Patient Instructions (Signed)
Your physician wants you to follow-up in: 6 months with Dr. Crenshaw. You will receive a reminder letter in the mail two months in advance. If you don't receive a letter, please call our office to schedule the follow-up appointment.  If you need a refill on your cardiac medications before your next appointment, please call your pharmacy.   

## 2016-06-30 DIAGNOSIS — J342 Deviated nasal septum: Secondary | ICD-10-CM | POA: Diagnosis not present

## 2016-06-30 DIAGNOSIS — H6062 Unspecified chronic otitis externa, left ear: Secondary | ICD-10-CM | POA: Diagnosis not present

## 2016-06-30 DIAGNOSIS — H6122 Impacted cerumen, left ear: Secondary | ICD-10-CM | POA: Diagnosis not present

## 2016-07-14 ENCOUNTER — Other Ambulatory Visit: Payer: Self-pay

## 2016-07-14 ENCOUNTER — Encounter: Payer: Self-pay | Admitting: Gastroenterology

## 2016-07-14 ENCOUNTER — Ambulatory Visit (INDEPENDENT_AMBULATORY_CARE_PROVIDER_SITE_OTHER): Payer: Medicare Other | Admitting: Gastroenterology

## 2016-07-14 VITALS — BP 152/80 | HR 68 | Ht 66.5 in | Wt 150.0 lb

## 2016-07-14 DIAGNOSIS — I5022 Chronic systolic (congestive) heart failure: Secondary | ICD-10-CM | POA: Diagnosis not present

## 2016-07-14 DIAGNOSIS — I2510 Atherosclerotic heart disease of native coronary artery without angina pectoris: Secondary | ICD-10-CM

## 2016-07-14 DIAGNOSIS — K625 Hemorrhage of anus and rectum: Secondary | ICD-10-CM

## 2016-07-14 DIAGNOSIS — Z95810 Presence of automatic (implantable) cardiac defibrillator: Secondary | ICD-10-CM | POA: Diagnosis not present

## 2016-07-14 DIAGNOSIS — R195 Other fecal abnormalities: Secondary | ICD-10-CM

## 2016-07-14 DIAGNOSIS — I255 Ischemic cardiomyopathy: Secondary | ICD-10-CM | POA: Diagnosis not present

## 2016-07-14 MED ORDER — NA SULFATE-K SULFATE-MG SULF 17.5-3.13-1.6 GM/177ML PO SOLN: 1.0000 | Freq: Once | ORAL | 0 refills | Status: AC

## 2016-07-14 NOTE — Patient Instructions (Signed)
If you are age 80 or older, your body mass index should be between 23-30. Your Body mass index is 23.85 kg/m. If this is out of the aforementioned range listed, please consider follow up with your Primary Care Provider.  If you are age 68 or younger, your body mass index should be between 19-25. Your Body mass index is 23.85 kg/m. If this is out of the aformentioned range listed, please consider follow up with your Primary Care Provider.   You have been scheduled for a colonoscopy. Please follow written instructions given to you at your visit today.  Please pick up your prep supplies at the pharmacy within the next 1-3 days. If you use inhalers (even only as needed), please bring them with you on the day of your procedure.  Thank you for choosing [facility]  Dr Wilfrid Lund III

## 2016-07-14 NOTE — Progress Notes (Signed)
[facility] Consult Note:  History: Corey Jordan 07/14/2016  Referring physician: Elby Showers, MD  Reason for consult/chief complaint: heme positive stool test and Rectal Bleeding (BRB on the toilet paper and in the toilet about 3 months ago)   Subjective  HPI:  [name] was here with his wife today. He has had 2 episodes of painless rectal bleeding in the last few months. It sounds like one of them occurred after a bowel movement, but the other was while sitting in a restaurant. He has had some constipation lately for which she takes prune juice. His appetite is also been decreased and he is lost about 5 pounds in the last month or so. He denies abdominal pain dysphagia nausea vomiting or early satiety. Stool cards 3 were positive, but these were ordered prior to the overt rectal bleeding. Normal colonoscopy in 2005 ROS:  Review of Systems  Constitutional: Negative for appetite change and unexpected weight change.  HENT: Negative for mouth sores and voice change.   Eyes: Negative for pain and redness.  Respiratory: Negative for cough and shortness of breath.   Cardiovascular: Negative for chest pain and palpitations.  Genitourinary: Negative for dysuria and hematuria.  Musculoskeletal: Negative for arthralgias and myalgias.  Skin: Negative for pallor and rash.  Neurological: Negative for weakness and headaches.  Hematological: Negative for adenopathy.     Past Medical History: Past Medical History:  Diagnosis Date  . Anxiety   . Atrial fibrillation ([place])   . Cardiac defibrillator in situ    s/p MDT Maximo VR single lead ICD - 07/06/2006  . Coronary artery disease    s/p Anterior MI 10/23/03 - 2 Cypher DES placed in 100% LAD; 12/2008 - cath - patent LAD stents w/ 90% D1 stenosis (small - unchanged);  LHC 11/09/11: LAD stent patent, mid 20-30%, D1 70-80% ostial (no change with 2010), circumflex normal, proximal RCA 20-30%, EF 35-40%, large apical aneurysm  which is old.  . Degenerative joint disease   . Depression   . H/O hiatal hernia    times 3  . History of orthostatic hypotension   . Hyperlipidemia   . Hypertension   . Hypothyroidism   . Ischemic cardiomyopathy    EF 40% 12/2008 V gram;  Echocardiogram 11/08/11: EF 30-35%, inferoseptal and anteroseptal, apical septal/lateral/anterior/inferior and true apical akinesis, no LV thrombus, grade 1 diastolic dysfunction.  . Lumbar spondylosis    s/p L5-S1 fusion 12/11/2009  . Premature atrial contractions   . Premature ventricular contractions   . Skin cancer    skin  . Systolic CHF, chronic ([place])      Past Surgical History: Past Surgical History:  Procedure Laterality Date  . BACK SURGERY     x 2  . EP IMPLANTABLE DEVICE [name] 04/25/2015   Procedure: ICD Generator Changeout;  Surgeon: Evans Lance, MD;  Location: [place] CV LAB;  Service: Cardiovascular;  Laterality: [name];  . icd     s/p MDT Maximo VR single lead ICD - 07/06/2006  . INGUINAL HERNIA REPAIR Left    x 3  . LEFT HEART CATHETERIZATION WITH CORONARY ANGIOGRAM [name] 11/09/2011   Procedure: LEFT HEART CATHETERIZATION WITH CORONARY ANGIOGRAM;  Surgeon: Josue Hector, MD;  Location: [facility] CATH LAB;  Service: Cardiovascular;  Laterality: [name];  . LUMBAR FUSION     s/p L5-S1 fusion 12/11/2009  . MOHS SURGERY     x 3, head nose  . SKIN CANCER EXCISION       Family  History: Family History  Problem Relation Age of Onset  . Peripheral vascular disease Father     deceased 6  . Pneumonia Mother     deceased 30  . Prostate cancer Brother   . Diabetes Brother   . Heart disease Brother   . Cancer Brother     unknown type    Social History: Social History   Social History  . Marital status: Married    Spouse name: [name]  . Number of children: 2  . Years of education: [name]   Occupational History  . retired [name]   Social History Main Topics  . Smoking status: Never Smoker  . Smokeless tobacco: Never Used  .  Alcohol use No  . Drug use: No  . Sexual activity: No   Other Topics Concern  . None   Social History Narrative   Active, walks regularly.    Allergies: Allergies  Allergen Reactions  . Pollen Extract Other (See Comments)    Sneezing and watery eyes    Outpatient Meds: Current Outpatient Prescriptions  Medication Sig Dispense Refill  . aspirin 81 MG EC tablet Take 81 mg by mouth daily.      Marland Kitchen atorvastatin (LIPITOR) 40 MG tablet Take 1 tablet (40 mg total) by mouth daily. 90 tablet 3  . calcium carbonate (OS-CAL) 600 MG TABS Take 600 mg by mouth daily.     . carvedilol (COREG) 6.25 MG tablet Take 1 tablet (6.25 mg total) by mouth 2 (two) times daily with a meal. 180 tablet 3  . cholecalciferol (VITAMIN D) 1000 UNITS tablet Take 1,000 Units by mouth daily.     . furosemide (LASIX) 20 MG tablet Take 20 mg by mouth daily as needed for fluid.    Marland Kitchen levothyroxine (SYNTHROID, LEVOTHROID) 50 MCG tablet Take 1 tablet (50 mcg total) by mouth every morning. 90 tablet 3  . nitroGLYCERIN (NITROSTAT) 0.4 MG SL tablet Place 1 tablet (0.4 mg total) under the tongue every 5 (five) minutes as needed. For chest pain 25 tablet 6  . omeprazole (PRILOSEC) 20 MG capsule Take 20 mg by mouth daily as needed (heart burn).    . ramipril (ALTACE) 5 MG capsule TAKE 1 CAPSULE DAILY 90 capsule 3  . spironolactone (ALDACTONE) 25 MG tablet TAKE 1/2 TABLET (=12.5MG)  DAILY 45 tablet 7  . Na Sulfate-K Sulfate-Mg Sulf 17.5-3.13-1.6 GM/180ML SOLN Take 1 kit by mouth once. 354 mL 0   No current facility-administered medications for this visit.       ___________________________________________________________________ Objective   Exam:  BP (!) 152/80 (BP Location: Left Arm, Patient Position: Sitting, Cuff Size: Normal)   Pulse 68   Ht 5' 6.5" (1.689 m) Comment: height measured without shoes  Wt 150 lb (68 kg)   BMI 23.85 kg/m    General: this is a(n) Well-appearing elderly man   Eyes: sclera anicteric,  no redness  ENT: oral mucosa moist without lesions, no cervical or supraclavicular lymphadenopathy, good dentition  CV: RRR without murmur, S1/S2, no JVD, no peripheral edema  Resp: clear to auscultation bilaterally, normal RR and effort noted  GI: soft, no tenderness, with active bowel sounds. No guarding or palpable organomegaly noted.  Skin; warm and dry, no rash or jaundice noted  Neuro: awake, alert and oriented x 3. Normal gross motor function and fluent speech Rectal: firm stool, o/w normal  Labs:  CBC    Component Value Date/Time   WBC 6.1 04/16/2016 1010   RBC 4.36  04/16/2016 1010   HGB 12.9 (L) 04/16/2016 1010   HCT 40.1 04/16/2016 1010   PLT 187 04/16/2016 1010   MCV 92.0 04/16/2016 1010   MCH 29.6 04/16/2016 1010   MCHC 32.2 04/16/2016 1010   RDW 13.7 04/16/2016 1010   LYMPHSABS 915 04/16/2016 1010   MONOABS 671 04/16/2016 1010   EOSABS 122 04/16/2016 1010   BASOSABS 0 04/16/2016 1010   FOBT x 3 pos in June   Assessment: Encounter Diagnoses  Name Primary?  . Rectal bleeding Yes  . Systolic CHF, chronic ([place])   . Coronary artery disease involving native coronary artery of native heart without angina pectoris   . S/P ICD (internal cardiac defibrillator) procedure   . Heme positive stool     The cause of bleeding is unclear, it may yet be hemorrhoids despite a normal exam. Nevertheless, he should have a colonoscopy to rule out neoplasia, and he is agreeable after discussion of the procedure and risks.  Plan:   Colonoscopy at hospital endo lab due to his heart condition, which makes the procedure at increased risk.   The benefits and risks of the planned procedure were described in detail with the patient or (when appropriate) their health care proxy.  Risks were outlined as including, but not limited to, bleeding, infection, perforation, adverse medication reaction leading to cardiac or pulmonary decompensation, or pancreatitis (if ERCP).  The limitation  of incomplete mucosal visualization was also discussed.  No guarantees or warranties were given.  Thank you for the courtesy of this consult.  Please call me with any questions or concerns.  Nelida Meuse III  CC: Elby Showers, MD

## 2016-07-30 ENCOUNTER — Telehealth: Payer: Self-pay | Admitting: Gastroenterology

## 2016-07-30 NOTE — Telephone Encounter (Signed)
The appt for colon at [facility] has been cancelled and pt aware.  The pt states that he is not interested in having any procedures at this time.  Forwarded to Dr Loletha Carrow for review

## 2016-07-30 NOTE — Telephone Encounter (Signed)
Understood. Let me know if he changes his mind. No further action needed at this time.

## 2016-08-17 DIAGNOSIS — D485 Neoplasm of uncertain behavior of skin: Secondary | ICD-10-CM | POA: Diagnosis not present

## 2016-08-17 DIAGNOSIS — Z85828 Personal history of other malignant neoplasm of skin: Secondary | ICD-10-CM | POA: Diagnosis not present

## 2016-08-17 DIAGNOSIS — D1801 Hemangioma of skin and subcutaneous tissue: Secondary | ICD-10-CM | POA: Diagnosis not present

## 2016-08-17 DIAGNOSIS — L821 Other seborrheic keratosis: Secondary | ICD-10-CM | POA: Diagnosis not present

## 2016-08-17 DIAGNOSIS — L570 Actinic keratosis: Secondary | ICD-10-CM | POA: Diagnosis not present

## 2016-08-26 ENCOUNTER — Other Ambulatory Visit: Payer: Self-pay | Admitting: *Deleted

## 2016-08-26 ENCOUNTER — Ambulatory Visit (INDEPENDENT_AMBULATORY_CARE_PROVIDER_SITE_OTHER): Payer: Medicare Other | Admitting: *Deleted

## 2016-08-26 DIAGNOSIS — I255 Ischemic cardiomyopathy: Secondary | ICD-10-CM

## 2016-08-26 MED ORDER — ATORVASTATIN CALCIUM 40 MG PO TABS: 40.0000 mg | ORAL_TABLET | Freq: Every day | ORAL | 3 refills | Status: DC

## 2016-08-26 NOTE — Progress Notes (Signed)
Remote ICD transmission.   

## 2016-08-28 ENCOUNTER — Ambulatory Visit (HOSPITAL_COMMUNITY): Admit: 2016-08-28 | Payer: Medicare Other | Admitting: Gastroenterology

## 2016-08-28 ENCOUNTER — Encounter (HOSPITAL_COMMUNITY): Payer: Self-pay

## 2016-08-28 ENCOUNTER — Encounter: Payer: Self-pay | Admitting: Cardiology

## 2016-08-28 SURGERY — COLONOSCOPY WITH PROPOFOL
Anesthesia: Monitor Anesthesia Care

## 2016-09-03 LAB — CUP PACEART REMOTE DEVICE CHECK
Brady Statistic RV Percent Paced: 0.16 %
HIGH POWER IMPEDANCE MEASURED VALUE: 40 Ohm
HighPow Impedance: 54 Ohm
Implantable Lead Model: 6947
Lead Channel Impedance Value: 456 Ohm
Lead Channel Impedance Value: 475 Ohm
Lead Channel Setting Pacing Amplitude: 2.5 V
Lead Channel Setting Pacing Pulse Width: 0.4 ms
Lead Channel Setting Sensing Sensitivity: 0.3 mV
MDC IDC LEAD IMPLANT DT: 20070814
MDC IDC LEAD LOCATION: 753860
MDC IDC MSMT BATTERY REMAINING LONGEVITY: 123 mo
MDC IDC MSMT BATTERY VOLTAGE: 3.01 V
MDC IDC MSMT LEADCHNL RV PACING THRESHOLD AMPLITUDE: 0.875 V
MDC IDC MSMT LEADCHNL RV PACING THRESHOLD PULSEWIDTH: 0.4 ms
MDC IDC MSMT LEADCHNL RV SENSING INTR AMPL: 23.5 mV
MDC IDC MSMT LEADCHNL RV SENSING INTR AMPL: 23.5 mV
MDC IDC SESS DTM: 20171004073323

## 2016-09-30 DIAGNOSIS — J343 Hypertrophy of nasal turbinates: Secondary | ICD-10-CM | POA: Diagnosis not present

## 2016-09-30 DIAGNOSIS — H6121 Impacted cerumen, right ear: Secondary | ICD-10-CM | POA: Diagnosis not present

## 2016-09-30 DIAGNOSIS — J342 Deviated nasal septum: Secondary | ICD-10-CM | POA: Diagnosis not present

## 2016-10-19 ENCOUNTER — Other Ambulatory Visit: Payer: Medicare Other | Admitting: Internal Medicine

## 2016-10-19 DIAGNOSIS — E785 Hyperlipidemia, unspecified: Secondary | ICD-10-CM

## 2016-10-19 DIAGNOSIS — R7302 Impaired glucose tolerance (oral): Secondary | ICD-10-CM

## 2016-10-19 DIAGNOSIS — E039 Hypothyroidism, unspecified: Secondary | ICD-10-CM

## 2016-10-19 DIAGNOSIS — I10 Essential (primary) hypertension: Secondary | ICD-10-CM | POA: Diagnosis not present

## 2016-10-19 LAB — HEPATIC FUNCTION PANEL
ALBUMIN: 4 g/dL (ref 3.6–5.1)
ALT: 14 U/L (ref 9–46)
AST: 20 U/L (ref 10–35)
Alkaline Phosphatase: 72 U/L (ref 40–115)
BILIRUBIN TOTAL: 0.7 mg/dL (ref 0.2–1.2)
Bilirubin, Direct: 0.2 mg/dL (ref <=0.2)
Indirect Bilirubin: 0.5 mg/dL (ref 0.2–1.2)
TOTAL PROTEIN: 6.2 g/dL (ref 6.1–8.1)

## 2016-10-19 LAB — LIPID PANEL
CHOL/HDL RATIO: 1.8 ratio (ref <5.0)
CHOLESTEROL: 140 mg/dL (ref <200)
HDL: 80 mg/dL (ref >40)
LDL Cholesterol: 52 mg/dL (ref <100)
Triglycerides: 40 mg/dL (ref <150)
VLDL: 8 mg/dL (ref <30)

## 2016-10-19 LAB — HEMOGLOBIN A1C
Hgb A1c MFr Bld: 5.6 % (ref <5.7)
Mean Plasma Glucose: 114 mg/dL

## 2016-10-19 LAB — TSH: TSH: 2.45 m[IU]/L (ref 0.40–4.50)

## 2016-10-20 ENCOUNTER — Ambulatory Visit (INDEPENDENT_AMBULATORY_CARE_PROVIDER_SITE_OTHER): Payer: Medicare Other | Admitting: Internal Medicine

## 2016-10-20 ENCOUNTER — Encounter: Payer: Self-pay | Admitting: Internal Medicine

## 2016-10-20 VITALS — BP 136/78 | HR 59 | Temp 97.5°F | Ht 67.0 in | Wt 150.0 lb

## 2016-10-20 DIAGNOSIS — I259 Chronic ischemic heart disease, unspecified: Secondary | ICD-10-CM

## 2016-10-20 DIAGNOSIS — I5189 Other ill-defined heart diseases: Secondary | ICD-10-CM

## 2016-10-20 DIAGNOSIS — K219 Gastro-esophageal reflux disease without esophagitis: Secondary | ICD-10-CM

## 2016-10-20 DIAGNOSIS — I5022 Chronic systolic (congestive) heart failure: Secondary | ICD-10-CM | POA: Diagnosis not present

## 2016-10-20 DIAGNOSIS — E784 Other hyperlipidemia: Secondary | ICD-10-CM | POA: Diagnosis not present

## 2016-10-20 DIAGNOSIS — I252 Old myocardial infarction: Secondary | ICD-10-CM | POA: Diagnosis not present

## 2016-10-20 DIAGNOSIS — Z23 Encounter for immunization: Secondary | ICD-10-CM

## 2016-10-20 DIAGNOSIS — F439 Reaction to severe stress, unspecified: Secondary | ICD-10-CM

## 2016-10-20 DIAGNOSIS — R7302 Impaired glucose tolerance (oral): Secondary | ICD-10-CM

## 2016-10-20 DIAGNOSIS — I519 Heart disease, unspecified: Secondary | ICD-10-CM

## 2016-10-20 DIAGNOSIS — I10 Essential (primary) hypertension: Secondary | ICD-10-CM

## 2016-10-20 DIAGNOSIS — Z95810 Presence of automatic (implantable) cardiac defibrillator: Secondary | ICD-10-CM | POA: Diagnosis not present

## 2016-10-20 DIAGNOSIS — E7849 Other hyperlipidemia: Secondary | ICD-10-CM

## 2016-10-20 MED ORDER — LEVOTHYROXINE SODIUM 50 MCG PO TABS: 50.0000 ug | ORAL_TABLET | ORAL | 3 refills | Status: DC

## 2016-10-20 MED ORDER — OMEPRAZOLE 20 MG PO CPDR: 20.0000 mg | DELAYED_RELEASE_CAPSULE | Freq: Every day | ORAL | 3 refills | Status: DC | PRN

## 2016-10-20 NOTE — Patient Instructions (Signed)
It was a pleasure to see you today. Please continue same medications and return for physical exam in 6 months. Flu vaccine given today.
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
## 2016-11-25 ENCOUNTER — Ambulatory Visit (INDEPENDENT_AMBULATORY_CARE_PROVIDER_SITE_OTHER): Payer: Medicare Other | Admitting: *Deleted

## 2016-11-25 DIAGNOSIS — I255 Ischemic cardiomyopathy: Secondary | ICD-10-CM

## 2016-11-26 NOTE — Progress Notes (Signed)
Remote ICD transmission.   

## 2016-11-27 ENCOUNTER — Encounter: Payer: Self-pay | Admitting: Cardiology

## 2016-12-04 LAB — CUP PACEART REMOTE DEVICE CHECK
Battery Remaining Longevity: 121 mo
Battery Voltage: 3.01 V
Brady Statistic RV Percent Paced: 0.1 %
Date Time Interrogation Session: 20180103072823
HIGH POWER IMPEDANCE MEASURED VALUE: 59 Ohm
HighPow Impedance: 43 Ohm
Implantable Lead Location: 753860
Implantable Pulse Generator Implant Date: 20160602
Lead Channel Impedance Value: 456 Ohm
Lead Channel Impedance Value: 475 Ohm
Lead Channel Sensing Intrinsic Amplitude: 22.875 mV
Lead Channel Sensing Intrinsic Amplitude: 22.875 mV
Lead Channel Setting Pacing Amplitude: 2.5 V
MDC IDC LEAD IMPLANT DT: 20070814
MDC IDC MSMT LEADCHNL RV PACING THRESHOLD AMPLITUDE: 0.75 V
MDC IDC MSMT LEADCHNL RV PACING THRESHOLD PULSEWIDTH: 0.4 ms
MDC IDC SET LEADCHNL RV PACING PULSEWIDTH: 0.4 ms
MDC IDC SET LEADCHNL RV SENSING SENSITIVITY: 0.3 mV

## 2016-12-15 ENCOUNTER — Other Ambulatory Visit: Payer: Self-pay | Admitting: *Deleted

## 2016-12-15 MED ORDER — SPIRONOLACTONE 25 MG PO TABS
ORAL_TABLET | ORAL | 1 refills | Status: DC
Start: 2016-12-15 — End: 2017-01-07

## 2017-01-06 ENCOUNTER — Observation Stay (HOSPITAL_COMMUNITY)
Admission: EM | Admit: 2017-01-06 | Discharge: 2017-01-07 | Disposition: A | Discharge status: Home / Self Care | Payer: Medicare Other | Attending: Family Medicine | Admitting: Family Medicine

## 2017-01-06 ENCOUNTER — Emergency Department (HOSPITAL_COMMUNITY): Payer: Medicare Other

## 2017-01-06 ENCOUNTER — Encounter (HOSPITAL_COMMUNITY): Payer: Self-pay | Admitting: Emergency Medicine

## 2017-01-06 DIAGNOSIS — Z85828 Personal history of other malignant neoplasm of skin: Secondary | ICD-10-CM | POA: Diagnosis not present

## 2017-01-06 DIAGNOSIS — I110 Hypertensive heart disease with heart failure: Secondary | ICD-10-CM | POA: Diagnosis not present

## 2017-01-06 DIAGNOSIS — J101 Influenza due to other identified influenza virus with other respiratory manifestations: Secondary | ICD-10-CM | POA: Diagnosis not present

## 2017-01-06 DIAGNOSIS — R6510 Systemic inflammatory response syndrome (SIRS) of non-infectious origin without acute organ dysfunction: Secondary | ICD-10-CM | POA: Diagnosis not present

## 2017-01-06 DIAGNOSIS — N179 Acute kidney failure, unspecified: Secondary | ICD-10-CM | POA: Insufficient documentation

## 2017-01-06 DIAGNOSIS — R42 Dizziness and giddiness: Secondary | ICD-10-CM | POA: Diagnosis not present

## 2017-01-06 DIAGNOSIS — E785 Hyperlipidemia, unspecified: Secondary | ICD-10-CM | POA: Insufficient documentation

## 2017-01-06 DIAGNOSIS — Z7982 Long term (current) use of aspirin: Secondary | ICD-10-CM | POA: Insufficient documentation

## 2017-01-06 DIAGNOSIS — M542 Cervicalgia: Secondary | ICD-10-CM | POA: Diagnosis not present

## 2017-01-06 DIAGNOSIS — L8990 Pressure ulcer of unspecified site, unspecified stage: Secondary | ICD-10-CM | POA: Insufficient documentation

## 2017-01-06 DIAGNOSIS — I4891 Unspecified atrial fibrillation: Secondary | ICD-10-CM | POA: Diagnosis not present

## 2017-01-06 DIAGNOSIS — R55 Syncope and collapse: Secondary | ICD-10-CM | POA: Insufficient documentation

## 2017-01-06 DIAGNOSIS — Z79899 Other long term (current) drug therapy: Secondary | ICD-10-CM | POA: Insufficient documentation

## 2017-01-06 DIAGNOSIS — I252 Old myocardial infarction: Secondary | ICD-10-CM | POA: Diagnosis not present

## 2017-01-06 DIAGNOSIS — E039 Hypothyroidism, unspecified: Secondary | ICD-10-CM | POA: Diagnosis not present

## 2017-01-06 DIAGNOSIS — I255 Ischemic cardiomyopathy: Secondary | ICD-10-CM | POA: Insufficient documentation

## 2017-01-06 DIAGNOSIS — I2510 Atherosclerotic heart disease of native coronary artery without angina pectoris: Secondary | ICD-10-CM | POA: Diagnosis not present

## 2017-01-06 DIAGNOSIS — R509 Fever, unspecified: Secondary | ICD-10-CM | POA: Diagnosis not present

## 2017-01-06 DIAGNOSIS — E860 Dehydration: Secondary | ICD-10-CM | POA: Insufficient documentation

## 2017-01-06 DIAGNOSIS — I5022 Chronic systolic (congestive) heart failure: Secondary | ICD-10-CM | POA: Diagnosis not present

## 2017-01-06 DIAGNOSIS — Z95810 Presence of automatic (implantable) cardiac defibrillator: Secondary | ICD-10-CM | POA: Diagnosis not present

## 2017-01-06 DIAGNOSIS — Z955 Presence of coronary angioplasty implant and graft: Secondary | ICD-10-CM | POA: Diagnosis not present

## 2017-01-06 DIAGNOSIS — R404 Transient alteration of awareness: Secondary | ICD-10-CM | POA: Diagnosis not present

## 2017-01-06 DIAGNOSIS — I10 Essential (primary) hypertension: Secondary | ICD-10-CM | POA: Diagnosis present

## 2017-01-06 DIAGNOSIS — J09X2 Influenza due to identified novel influenza A virus with other respiratory manifestations: Secondary | ICD-10-CM | POA: Diagnosis not present

## 2017-01-06 LAB — CBC WITH DIFFERENTIAL/PLATELET
BASOS ABS: 0.1 10*3/uL (ref 0.0–0.1)
Basophils Relative: 1 %
EOS PCT: 0 %
Eosinophils Absolute: 0 10*3/uL (ref 0.0–0.7)
HCT: 38.6 % — ABNORMAL LOW (ref 39.0–52.0)
Hemoglobin: 12.6 g/dL — ABNORMAL LOW (ref 13.0–17.0)
LYMPHS PCT: 10 %
Lymphs Abs: 0.9 10*3/uL (ref 0.7–4.0)
MCH: 30.2 pg (ref 26.0–34.0)
MCHC: 32.6 g/dL (ref 30.0–36.0)
MCV: 92.6 fL (ref 78.0–100.0)
MONO ABS: 1.8 10*3/uL — AB (ref 0.1–1.0)
Monocytes Relative: 20 %
Neutro Abs: 6.2 10*3/uL (ref 1.7–7.7)
Neutrophils Relative %: 69 %
PLATELETS: 120 10*3/uL — AB (ref 150–400)
RBC: 4.17 MIL/uL — ABNORMAL LOW (ref 4.22–5.81)
RDW: 13.9 % (ref 11.5–15.5)
WBC: 8.9 10*3/uL (ref 4.0–10.5)

## 2017-01-06 LAB — LIPASE, BLOOD: LIPASE: 24 U/L (ref 11–51)

## 2017-01-06 LAB — URINALYSIS, MICROSCOPIC (REFLEX): WBC, UA: NONE SEEN WBC/hpf (ref 0–5)

## 2017-01-06 LAB — I-STAT CG4 LACTIC ACID, ED
LACTIC ACID, VENOUS: 2.68 mmol/L — AB (ref 0.5–1.9)
LACTIC ACID, VENOUS: 0.8 mmol/L (ref 0.5–1.9)

## 2017-01-06 LAB — URINALYSIS, ROUTINE W REFLEX MICROSCOPIC
BILIRUBIN URINE: NEGATIVE
GLUCOSE, UA: NEGATIVE mg/dL
KETONES UR: NEGATIVE mg/dL
Leukocytes, UA: NEGATIVE
Nitrite: NEGATIVE
PROTEIN: NEGATIVE mg/dL
Specific Gravity, Urine: 1.02 (ref 1.005–1.030)
pH: 5.5 (ref 5.0–8.0)

## 2017-01-06 LAB — PROTIME-INR
INR: 1.06
Prothrombin Time: 13.8 seconds (ref 11.4–15.2)

## 2017-01-06 LAB — CBG MONITORING, ED: Glucose-Capillary: 103 mg/dL — ABNORMAL HIGH (ref 65–99)

## 2017-01-06 LAB — COMPREHENSIVE METABOLIC PANEL
ALK PHOS: 47 U/L (ref 38–126)
ALT: 15 U/L — AB (ref 17–63)
AST: 30 U/L (ref 15–41)
Albumin: 3.4 g/dL — ABNORMAL LOW (ref 3.5–5.0)
Anion gap: 12 (ref 5–15)
BILIRUBIN TOTAL: 1 mg/dL (ref 0.3–1.2)
BUN: 17 mg/dL (ref 6–20)
CALCIUM: 8.8 mg/dL — AB (ref 8.9–10.3)
CO2: 22 mmol/L (ref 22–32)
CREATININE: 1.49 mg/dL — AB (ref 0.61–1.24)
Chloride: 96 mmol/L — ABNORMAL LOW (ref 101–111)
GFR, EST AFRICAN AMERICAN: 49 mL/min — AB (ref >60)
GFR, EST NON AFRICAN AMERICAN: 43 mL/min — AB (ref >60)
Glucose, Bld: 115 mg/dL — ABNORMAL HIGH (ref 65–99)
Potassium: 4.1 mmol/L (ref 3.5–5.1)
Sodium: 130 mmol/L — ABNORMAL LOW (ref 135–145)
Total Protein: 6 g/dL — ABNORMAL LOW (ref 6.5–8.1)

## 2017-01-06 LAB — INFLUENZA PANEL BY PCR (TYPE A & B)
Influenza A By PCR: POSITIVE — AB
Influenza B By PCR: NEGATIVE

## 2017-01-06 LAB — TSH: TSH: 1.417 u[IU]/mL (ref 0.350–4.500)

## 2017-01-06 MED ORDER — VANCOMYCIN HCL IN DEXTROSE 1-5 GM/200ML-% IV SOLN: 1000.0000 mg | Freq: Once | INTRAVENOUS | Status: DC

## 2017-01-06 MED ORDER — PIPERACILLIN-TAZOBACTAM 3.375 G IVPB: 3.3750 g | Freq: Three times a day (TID) | INTRAVENOUS | Status: DC

## 2017-01-06 MED ORDER — ONDANSETRON HCL 4 MG PO TABS: 4.0000 mg | ORAL_TABLET | Freq: Four times a day (QID) | ORAL | Status: DC | PRN

## 2017-01-06 MED ORDER — ACETAMINOPHEN 650 MG RE SUPP: 650.0000 mg | Freq: Four times a day (QID) | RECTAL | Status: DC | PRN

## 2017-01-06 MED ORDER — TRAZODONE HCL 50 MG PO TABS: 25.0000 mg | ORAL_TABLET | Freq: Every evening | ORAL | Status: DC | PRN

## 2017-01-06 MED ORDER — VANCOMYCIN HCL IN DEXTROSE 1-5 GM/200ML-% IV SOLN
1000.0000 mg | Freq: Once | INTRAVENOUS | Status: AC
  Administered 2017-01-06: 1000 mg via INTRAVENOUS
  Filled 2017-01-06: qty 200

## 2017-01-06 MED ORDER — NITROGLYCERIN 0.4 MG SL SUBL: 0.4000 mg | SUBLINGUAL_TABLET | SUBLINGUAL | Status: DC | PRN

## 2017-01-06 MED ORDER — ENSURE ENLIVE PO LIQD
237.0000 mL | Freq: Two times a day (BID) | ORAL | Status: DC
  Administered 2017-01-07 (×2): 237 mL via ORAL

## 2017-01-06 MED ORDER — PIPERACILLIN-TAZOBACTAM 3.375 G IVPB 30 MIN
3.3750 g | Freq: Once | INTRAVENOUS | Status: AC
  Administered 2017-01-06: 3.375 g via INTRAVENOUS
  Filled 2017-01-06: qty 50

## 2017-01-06 MED ORDER — SODIUM CHLORIDE 0.9 % IV BOLUS (SEPSIS)
1000.0000 mL | Freq: Once | INTRAVENOUS | Status: AC
  Administered 2017-01-06: 1000 mL via INTRAVENOUS

## 2017-01-06 MED ORDER — CALCIUM CARBONATE 1250 (500 CA) MG PO TABS
1250.0000 mg | ORAL_TABLET | Freq: Every day | ORAL | Status: DC
  Administered 2017-01-07: 1250 mg via ORAL
  Filled 2017-01-06: qty 1

## 2017-01-06 MED ORDER — ASPIRIN EC 81 MG PO TBEC
81.0000 mg | DELAYED_RELEASE_TABLET | Freq: Every day | ORAL | Status: DC
  Administered 2017-01-07: 81 mg via ORAL
  Filled 2017-01-06: qty 1

## 2017-01-06 MED ORDER — POLYETHYLENE GLYCOL 3350 17 G PO PACK: 17.0000 g | PACK | Freq: Every day | ORAL | Status: DC | PRN

## 2017-01-06 MED ORDER — SODIUM CHLORIDE 0.9 % IV BOLUS (SEPSIS)
250.0000 mL | Freq: Once | INTRAVENOUS | Status: AC
  Administered 2017-01-06: 250 mL via INTRAVENOUS

## 2017-01-06 MED ORDER — ONDANSETRON HCL 4 MG/2ML IJ SOLN: 4.0000 mg | Freq: Four times a day (QID) | INTRAMUSCULAR | Status: DC | PRN

## 2017-01-06 MED ORDER — LEVOTHYROXINE SODIUM 50 MCG PO TABS
50.0000 ug | ORAL_TABLET | ORAL | Status: DC
  Administered 2017-01-07: 50 ug via ORAL
  Filled 2017-01-06 (×2): qty 1

## 2017-01-06 MED ORDER — PANTOPRAZOLE SODIUM 40 MG PO TBEC: 40.0000 mg | DELAYED_RELEASE_TABLET | Freq: Every day | ORAL | Status: DC

## 2017-01-06 MED ORDER — OSELTAMIVIR PHOSPHATE 30 MG PO CAPS
30.0000 mg | ORAL_CAPSULE | Freq: Two times a day (BID) | ORAL | Status: DC
  Administered 2017-01-06 – 2017-01-07 (×2): 30 mg via ORAL
  Filled 2017-01-06 (×2): qty 1

## 2017-01-06 MED ORDER — OSELTAMIVIR PHOSPHATE 75 MG PO CAPS: 75.0000 mg | ORAL_CAPSULE | Freq: Two times a day (BID) | ORAL | Status: DC

## 2017-01-06 MED ORDER — HYDROCODONE-ACETAMINOPHEN 5-325 MG PO TABS: 1.0000 | ORAL_TABLET | ORAL | Status: DC | PRN

## 2017-01-06 MED ORDER — ATORVASTATIN CALCIUM 40 MG PO TABS
40.0000 mg | ORAL_TABLET | Freq: Every day | ORAL | Status: DC
  Administered 2017-01-07: 40 mg via ORAL
  Filled 2017-01-06 (×2): qty 1

## 2017-01-06 MED ORDER — ACETAMINOPHEN 325 MG PO TABS
650.0000 mg | ORAL_TABLET | Freq: Four times a day (QID) | ORAL | Status: DC | PRN
  Administered 2017-01-06: 650 mg via ORAL
  Filled 2017-01-06: qty 2

## 2017-01-06 MED ORDER — SODIUM CHLORIDE 0.9 % IV SOLN
INTRAVENOUS | Status: AC
  Administered 2017-01-06: 20:00:00 via INTRAVENOUS

## 2017-01-06 MED ORDER — ONDANSETRON HCL 4 MG/2ML IJ SOLN
4.0000 mg | Freq: Once | INTRAMUSCULAR | Status: AC
  Administered 2017-01-06: 4 mg via INTRAVENOUS
  Filled 2017-01-06: qty 2

## 2017-01-06 MED ORDER — HEPARIN SODIUM (PORCINE) 5000 UNIT/ML IJ SOLN
5000.0000 [IU] | Freq: Three times a day (TID) | INTRAMUSCULAR | Status: DC
Start: 2017-01-06 — End: 2017-01-07
  Administered 2017-01-06 – 2017-01-07 (×3): 5000 [IU] via SUBCUTANEOUS
  Filled 2017-01-06 (×3): qty 1

## 2017-01-06 MED ORDER — OSELTAMIVIR PHOSPHATE 75 MG PO CAPS
75.0000 mg | ORAL_CAPSULE | Freq: Once | ORAL | Status: AC
  Administered 2017-01-06: 75 mg via ORAL
  Filled 2017-01-06: qty 1

## 2017-01-06 MED ORDER — VITAMIN D 1000 UNITS PO TABS
1000.0000 [IU] | ORAL_TABLET | Freq: Every day | ORAL | Status: DC
  Administered 2017-01-07: 1000 [IU] via ORAL
  Filled 2017-01-06: qty 1

## 2017-01-06 NOTE — Progress Notes (Signed)
PHARMACY NOTE:  ANTIMICROBIAL RENAL DOSAGE ADJUSTMENT  Current antimicrobial regimen includes a mismatch between antimicrobial dosage and estimated renal function.  As per policy approved by the Pharmacy & Therapeutics and Medical Executive Committees, the antimicrobial dosage will be adjusted accordingly.  Current antimicrobial dosage:  Tamiflu 75 mg BID  Indication: Influenza A positive   Renal Function:  CrCl cannot be calculated (Unknown ideal weight.). []      On intermittent HD, scheduled: []      On CRRT    Antimicrobial dosage has been changed to:  Tamiflu 30 mg BID  Additional comments:   Thank you for allowing pharmacy to be a part of this patient's care.  Vincenza Hews, PharmD, BCPS 01/06/2017, 3:27 PM

## 2017-01-06 NOTE — ED Provider Notes (Signed)
[facility] DEPT Provider Note   CSN: [id number] Arrival date & time: 01/06/17  1026     History   Chief Complaint Chief Complaint  Patient presents with  . Near Syncope    HPI   Blood pressure (!) 80/46, pulse 70, temperature 99.5 F (37.5 C), temperature source Oral, resp. rate 14, SpO2 97 %.  Corey Jordan is a 83 y.o. male with past medical history significant for A. fib, defibrillator, ischemic cardiomyopathy (20 total EF of 30%; intermittently takes Lasix when necessary) brought in by EMS for presyncope this a.m. States that he has been doing poorly with tactile fever, chills, nausea with no emesis onset 3 days ago with associated reduced by mouth intake. He states that this morning he was in his bedroom he felt lightheaded with no prodrome of chest pain, he lowered himself to the ground and his wife immediately came to assist him, he did not actually lose consciousness, there was no head trauma. He denies anticoagulation. He states his wife feels sick as well with reduced by mouth intake and fever and chills. He has been taking his medications including high blood pressure medication over the last few days. He states that he has reduced stool output with no gross diarrhea, dysuria, hematuria. He reports a left-sided neck pain radiating down into the left arm which she's had for several months. Denies rhinorrhea above his baseline (she has chronic nasal problems from a remote fracture of the nose) sore throat, otalgia, myalgia.   Past Medical History:  Diagnosis Date  . Anxiety   . Atrial fibrillation ([place])   . Cardiac defibrillator in situ    s/p MDT Maximo VR single lead ICD - 07/06/2006  . 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  . Degenerative joint disease   . Depression   . H/O hiatal hernia    times 3  . History of orthostatic hypotension   . Hyperlipidemia   . Hypertension   . Hypothyroidism   . Ischemic cardiomyopathy    EF 40% 12/2008 V gram;  Echocardiogram 11/08/11: EF 30-35%, inferoseptal and anteroseptal, apical septal/lateral/anterior/inferior and true apical akinesis, no LV thrombus, grade 1 diastolic dysfunction.  . Lumbar spondylosis    s/p L5-S1 fusion 12/11/2009  . Premature atrial contractions   . Premature ventricular contractions   . Skin cancer    skin  . Systolic CHF, chronic [facility])     Patient Active Problem List   Diagnosis Date Noted  . Influenza A 01/06/2017  . AKI (acute kidney injury) ([place]) 01/06/2017  . Dehydration 01/06/2017  . Impaired glucose tolerance 05/22/2015  . S/P ICD (internal cardiac defibrillator) procedure 04/25/2015  . Automatic implantable cardioverter-defibrillator in situ 09/14/2012  . Premature atrial contractions   . Premature ventricular contractions   . Systolic CHF, chronic ([place])   . Lumbar spondylosis   . Coronary artery disease   . Cardiac defibrillator in situ   . Hyperlipidemia   . Hypertension   . Ischemic cardiomyopathy   . Hypothyroidism 09/19/2011  . Lumbar disc disease 09/19/2011  . GE reflux 09/19/2011  . HYPOTENSION, ORTHOSTATIC, HX OF 04/08/2010  . DEGENERATIVE JOINT DISEASE 04/09/2009    Past Surgical  History:  Procedure Laterality Date  . BACK SURGERY     x 2  . EP IMPLANTABLE DEVICE [name] 04/25/2015   Procedure: ICD Generator Changeout;  Surgeon: Evans Lance, MD;  Location: [place] CV LAB;  Service: Cardiovascular;  Laterality: [name];  . icd     s/p MDT Maximo VR single lead ICD - 07/06/2006  . INGUINAL HERNIA REPAIR Left    x 3  . LEFT HEART CATHETERIZATION WITH CORONARY ANGIOGRAM [name] 11/09/2011   Procedure: LEFT HEART CATHETERIZATION WITH CORONARY ANGIOGRAM;  Surgeon: Josue Hector, MD;  Location: [facility] CATH LAB;  Service: Cardiovascular;  Laterality: [name];  . LUMBAR FUSION     s/p L5-S1 fusion 12/11/2009  . MOHS SURGERY     x 3, head nose  . SKIN CANCER EXCISION         Home Medications    Prior to Admission medications   Medication Sig Start Date End Date Taking? Authorizing Provider  aspirin 81 MG EC tablet Take 81 mg by mouth daily.     Yes Historical Provider, MD  atorvastatin (LIPITOR) 40 MG tablet Take 1 tablet (40 mg total) by mouth daily. 08/26/16  Yes Lelon Perla, MD  calcium carbonate (OS-CAL) 600 MG TABS Take 600 mg by mouth daily.    Yes Historical Provider, MD  carvedilol (COREG) 6.25 MG tablet Take 1 tablet (6.25 mg total) by mouth 2 (two) times daily with a meal. 01/27/16  Yes Evans Lance, MD  cholecalciferol (VITAMIN D) 1000 UNITS tablet Take 1,000 Units by mouth daily.    Yes Historical Provider, MD  levothyroxine (SYNTHROID, LEVOTHROID) 50 MCG tablet Take 1 tablet (50 mcg total) by mouth every morning. 10/20/16  Yes Elby Showers, MD  loratadine (CLARITIN) 10 MG tablet Take 10 mg by mouth daily as needed for allergies.   Yes Historical Provider, MD  nitroGLYCERIN (NITROSTAT) 0.4 MG SL tablet Place 1 tablet (0.4 mg total) under the tongue every 5 (five) minutes as needed. For chest pain 04/19/15  Yes Lelon Perla, MD  ramipril (ALTACE) 5 MG capsule TAKE 1 CAPSULE DAILY 04/27/16  Yes Lelon Perla, MD  spironolactone (ALDACTONE) 25 MG tablet TAKE 1/2 TABLET (=12.5MG )  DAILY 12/15/16  Yes Evans Lance, MD    Family History Family History  Problem Relation Age of Onset  . Peripheral vascular disease Father     deceased 33  . Pneumonia Mother     deceased 108  . Prostate cancer Brother   . Diabetes Brother   . Heart disease Brother   . Cancer Brother     unknown type    Social History Social History  Substance Use Topics  . Smoking status: Never Smoker  . Smokeless tobacco: Never Used  . Alcohol use No     Allergies     Pollen extract   Review of Systems Review of Systems  10 systems reviewed and found to be negative, except as noted in the HPI.  Physical Exam Updated Vital Signs BP 97/55   Pulse 62   Temp 99.5 F (37.5 C) (Oral)   Resp 15   SpO2 98%   Physical Exam  Constitutional: He is oriented to person, place, and time. He appears well-developed and well-nourished. No distress.  HENT:  Head: Normocephalic and atraumatic.  Mouth/Throat: Oropharynx is clear and moist.  Eyes: Conjunctivae and EOM are normal. Pupils are equal, round, and reactive to light.  Neck: Normal range of motion.  FROM to C-spine. Pt can touch chin to chest without discomfort. No TTP of midline cervical spine.   Cardiovascular: Normal rate, regular rhythm and intact distal pulses.   Pulmonary/Chest: Effort normal and breath sounds normal.  Abdominal: Soft. There is no tenderness.  Musculoskeletal: Normal range of motion.  Neurological: He is alert and oriented to person, place, and time.  Skin: He is not diaphoretic.  Psychiatric: He has a normal mood and affect.  Nursing note and vitals reviewed.    ED Treatments / Results  Labs (all labs ordered are listed, but only abnormal results are displayed) Labs Reviewed  COMPREHENSIVE METABOLIC PANEL - Abnormal; Notable for the following:       Result Value   Sodium 130 (*)    Chloride 96 (*)    Glucose, Bld 115 (*)    Creatinine, Ser 1.49 (*)    Calcium 8.8 (*)    Total Protein 6.0 (*)    Albumin 3.4 (*)    ALT 15 (*)    GFR calc non Af Amer 43 (*)    GFR calc Af Amer 49 (*)    All other components within normal limits  CBC WITH DIFFERENTIAL/PLATELET - Abnormal; Notable for the following:    RBC 4.17 (*)    Hemoglobin 12.6 (*)    HCT 38.6 (*)    Platelets 120 (*)    Monocytes Absolute 1.8 (*)    All other components within normal limits  INFLUENZA PANEL BY PCR (TYPE A & B) - Abnormal; Notable for the following:    Influenza A By PCR POSITIVE (*)     All other components within normal limits  CBG MONITORING, ED - Abnormal; Notable for the following:    Glucose-Capillary 103 (*)    All other components within normal limits  I-STAT CG4 LACTIC ACID, ED - Abnormal; Notable for the following:    Lactic Acid, Venous 2.68 (*)    All other components within normal limits  CULTURE, BLOOD (ROUTINE X 2)  CULTURE, BLOOD (ROUTINE X 2)  PROTIME-INR  TSH  LIPASE, BLOOD  URINALYSIS, ROUTINE W REFLEX MICROSCOPIC  CBC  CREATININE, SERUM  I-STAT CG4 LACTIC ACID, ED    EKG  EKG Interpretation  Date/Time:  Wednesday January 06 2017 10:38:00 EST Ventricular Rate:  76 PR Interval:    QRS Duration: 113 QT Interval:  382 QTC Calculation: 430 R Axis:   19 Text Interpretation:  Sinus rhythm Anteroseptal infarct, age indeterminate new t wave inversions V4-V5 Confirmed by [facility] MD, JULIE [phone]) on 01/06/2017 11:07:32 AM       Radiology Dg Chest Portable 1 View  Result Date: 01/06/2017 CLINICAL DATA:  Fever.  Hypertension and possible sepsis EXAM: PORTABLE CHEST 1 VIEW COMPARISON:  11/06/2011 FINDINGS: There is a left chest wall ICD with lead in the right ventricle. Normal heart size. No pleural effusion or edema. No airspace opacities. IMPRESSION: 1. No acute cardiopulmonary abnormalities. Electronically Signed   By: Kerby Moors M.D.   On: 01/06/2017 11:17    Procedures Procedures (including critical care time)  Medications Ordered in ED Medications  levothyroxine (SYNTHROID, LEVOTHROID) tablet 50 mcg (not administered)  atorvastatin (LIPITOR) tablet 40 mg (not administered)  nitroGLYCERIN (NITROSTAT) SL tablet 0.4 mg (not administered)  calcium carbonate (OS-CAL - dosed in mg of elemental calcium) tablet 1,250 mg (not administered)  cholecalciferol (VITAMIN D) tablet 1,000 Units (not administered)  aspirin EC tablet 81 mg (not administered)  heparin injection 5,000 Units (not administered)  acetaminophen (  TYLENOL) tablet 650 mg (not  administered)    Or  acetaminophen (TYLENOL) suppository 650 mg (not administered)  traZODone (DESYREL) tablet 25 mg (not administered)  polyethylene glycol (MIRALAX / GLYCOLAX) packet 17 g (not administered)  HYDROcodone-acetaminophen (NORCO/VICODIN) 5-325 MG per tablet 1-2 tablet (not administered)  ondansetron (ZOFRAN) tablet 4 mg (not administered)    Or  ondansetron (ZOFRAN) injection 4 mg (not administered)  oseltamivir (TAMIFLU) capsule 30 mg (not administered)  oseltamivir (TAMIFLU) capsule 75 mg (75 mg Oral Given 01/06/17 1125)  sodium chloride 0.9 % bolus 1,000 mL (1,000 mLs Intravenous New Bag/Given 01/06/17 1126)    And  sodium chloride 0.9 % bolus 1,000 mL (1,000 mLs Intravenous New Bag/Given 01/06/17 1127)    And  sodium chloride 0.9 % bolus 250 mL (0 mLs Intravenous Stopped 01/06/17 1200)  piperacillin-tazobactam (ZOSYN) IVPB 3.375 g (0 g Intravenous Stopped 01/06/17 1156)  vancomycin (VANCOCIN) IVPB 1000 mg/200 mL premix (1,000 mg Intravenous New Bag/Given 01/06/17 1126)  ondansetron (ZOFRAN) injection 4 mg (4 mg Intravenous Given 01/06/17 1125)     Initial Impression / Assessment and Plan / ED Course  I have reviewed the triage vital signs and the nursing notes.  Pertinent labs & imaging results that were available during my care of the patient were reviewed by me and considered in my medical decision making (see chart for details).     Vitals:   01/06/17 1300 01/06/17 1315 01/06/17 1345 01/06/17 1400  BP: (!) 91/52 (!) 89/56 (!) 97/54 97/55  Pulse: (!) 59 67 (!) 56 62  Resp: 16 14 12 15   Temp:      TempSrc:      SpO2: 95% 95% 95% 98%    Medications  levothyroxine (SYNTHROID, LEVOTHROID) tablet 50 mcg (not administered)  atorvastatin (LIPITOR) tablet 40 mg (not administered)  nitroGLYCERIN (NITROSTAT) SL tablet 0.4 mg (not administered)  calcium carbonate (OS-CAL - dosed in mg of elemental calcium) tablet 1,250 mg (not administered)  cholecalciferol (VITAMIN D)  tablet 1,000 Units (not administered)  aspirin EC tablet 81 mg (not administered)  heparin injection 5,000 Units (not administered)  acetaminophen (TYLENOL) tablet 650 mg (not administered)    Or  acetaminophen (TYLENOL) suppository 650 mg (not administered)  traZODone (DESYREL) tablet 25 mg (not administered)  polyethylene glycol (MIRALAX / GLYCOLAX) packet 17 g (not administered)  HYDROcodone-acetaminophen (NORCO/VICODIN) 5-325 MG per tablet 1-2 tablet (not administered)  ondansetron (ZOFRAN) tablet 4 mg (not administered)    Or  ondansetron (ZOFRAN) injection 4 mg (not administered)  oseltamivir (TAMIFLU) capsule 30 mg (not administered)  oseltamivir (TAMIFLU) capsule 75 mg (75 mg Oral Given 01/06/17 1125)  sodium chloride 0.9 % bolus 1,000 mL (1,000 mLs Intravenous New Bag/Given 01/06/17 1126)    And  sodium chloride 0.9 % bolus 1,000 mL (1,000 mLs Intravenous New Bag/Given 01/06/17 1127)    And  sodium chloride 0.9 % bolus 250 mL (0 mLs Intravenous Stopped 01/06/17 1200)  piperacillin-tazobactam (ZOSYN) IVPB 3.375 g (0 g Intravenous Stopped 01/06/17 1156)  vancomycin (VANCOCIN) IVPB 1000 mg/200 mL premix (1,000 mg Intravenous New Bag/Given 01/06/17 1126)  ondansetron (ZOFRAN) injection 4 mg (4 mg Intravenous Given 01/06/17 1125)    Corey Jordan is 83 y.o. male presenting withPresyncopal event this morning, patient has not been eating or drinking over the last several days, he endorses tactile fever and chills with nausea and no emesis. She likely dehydrated, blood pressure is normally elevated, he has continued to take his blood pressure medications despite not  eating or drinking appropriately. There was no trauma this morning, he is not anticoagulated. He lowered himself to the ground. He is reporting a chronic neck pain but there are no meningeal signs. Patient with a systolic in the [id number], no true fever no tachypnea or tachycardia however his lactic acid is elevated at 2.6. Technically  septic, will initiate 30 mL per KG fluid bolus, broad-spectrum antibiotics and Tamiflu.  This is a shared visit with the attending physician who personally evaluated the patient and agrees with the care plan.   Patient with acute kidney injury, creatinine 1.5, he is normally under 1. H&H hemoconcentrated. No elevation and BUN. Likely secondary to dehydration is also been taking his blood pressure medications without eating or drinking. Will be admitted to [facility] for AKI.    Final Clinical Impressions(s) / ED Diagnoses   Final diagnoses:  Influenza A  AKI (acute kidney injury) [facility])    New Prescriptions New Prescriptions   No medications on file     Monico Blitz, PA-C 01/06/17 [place], MD 01/06/17 1545

## 2017-01-06 NOTE — ED Notes (Signed)
SpO2 96% on RA

## 2017-01-06 NOTE — ED Triage Notes (Signed)
Pt from home via [facility] with c/o chills, body aches, fever, nausea, and a near syncopal episode today.  EMS reports diminished lungs sounds, hypotension, diaphoresis.  Pt given 324 mg aspirin PTA.  A&O.

## 2017-01-06 NOTE — Progress Notes (Signed)
Pharmacy Antibiotic Note  Corey Jordan is a 83 y.o. male admitted on 01/06/2017 with sepsis.  Pharmacy has been consulted for vancomycin and zosyn dosing. Pt is afebrile and WBC is WNL. SCr is mildly elevated at 1.49. Lactic acid is elevated at 2.68. First doses per EDP.   Plan: Vancomycin 1gm IV Q24H Zosyn 3.375gm IV Q8H (4 hr inf) F/u renal fxn, C&S, clinical status and trough at SS     Temp (24hrs), Avg:99.5 F (37.5 C), Min:99.5 F (37.5 C), Max:99.5 F (37.5 C)   Recent Labs Lab 01/06/17 1041 01/06/17 1058  WBC 8.9  --   CREATININE 1.49*  --   LATICACIDVEN  --  2.68*    CrCl cannot be calculated (Unknown ideal weight.).    Allergies  Allergen Reactions  . Pollen Extract Other (See Comments)    Sneezing and watery eyes    Antimicrobials this admission: Vanc 2/14>> Zosyn 2/14>>  Dose adjustments this admission: [name]  Microbiology results: Pending  Thank you for allowing pharmacy to be a part of this patient's care.  Virgal Warmuth, Rande Lawman 01/06/2017 12:36 PM

## 2017-01-06 NOTE — H&P (Signed)
History and Physical    Corey Jordan OMB:[id number] DOB: [birth year] DOA: 01/06/2017  PCP: Elby Showers, MD Patient coming from: home  Chief Complaint: chills, body ache, fever  HPI: Corey Jordan is a 83 y.o. male with medical history significant of Afib, s/o ICD implantation, CAD with multiple previous interventions, ICM with EF HTN, HLD, hypothyroidism, DDD who presented to the ED with c/o four day history of shaking chills, body ache and subjective fever, anorexia. THis morning the patient tried to get and became near syncopal.  EMS was called and delivered him to the ED  ED Course: Upon arrival to the emergency department patient had a low-grade fever of 99.70F, blood pressure was 79/49 initially and slightly improved with hydration to 97/54 mmHg.  Blood work revealed hyponatremia with sodium 130, creatinine of 1.49, lactic acid of 2.68 Chest x-ray was negative for acute cardiopulmonary disease Nasal swab was positive for influenza A  Review of Systems: As per HPI otherwise 10 point review of systems negative.   Ambulatory Status: Independent Past Medical History:  Diagnosis Date  . Anxiety   . Atrial fibrillation ([place])   . Cardiac defibrillator in situ    s/p MDT Maximo VR single lead ICD - 07/06/2006  . 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  . Degenerative joint disease   . Depression   . H/O hiatal hernia    times 3  . History of orthostatic hypotension   . Hyperlipidemia   . Hypertension   . Hypothyroidism   . Ischemic cardiomyopathy    EF 40% 12/2008 V gram;  Echocardiogram 11/08/11: EF 30-35%, inferoseptal and anteroseptal, apical septal/lateral/anterior/inferior and true  apical akinesis, no LV thrombus, grade 1 diastolic dysfunction.  . Lumbar spondylosis    s/p L5-S1 fusion 12/11/2009  . Premature atrial contractions   . Premature ventricular contractions   . Skin cancer    skin  . Systolic CHF, chronic ([place])     Past Surgical History:  Procedure Laterality Date  . BACK SURGERY     x 2  . EP IMPLANTABLE DEVICE [name] 04/25/2015   Procedure: ICD Generator Changeout;  Surgeon: Evans Lance, MD;  Location: [place] CV LAB;  Service: Cardiovascular;  Laterality: [name];  . icd     s/p MDT Maximo VR single lead ICD - 07/06/2006  . INGUINAL HERNIA REPAIR Left    x 3  . LEFT HEART CATHETERIZATION WITH CORONARY ANGIOGRAM [name] 11/09/2011   Procedure: LEFT HEART CATHETERIZATION WITH CORONARY ANGIOGRAM;  Surgeon: Josue Hector, MD;  Location: [facility] CATH LAB;  Service: Cardiovascular;  Laterality: [name];  . LUMBAR FUSION     s/p L5-S1 fusion 12/11/2009  . MOHS SURGERY     x 3, head nose  . SKIN CANCER EXCISION      Social History   Social History  . Marital status: Married    Spouse name: [name]  . Number of children: 2  . Years of education: [name]   Occupational History  . retired [name]   Social History Main Topics  . Smoking status: Never Smoker  . Smokeless tobacco: Never Used  . Alcohol use No  . Drug  use: No  . Sexual activity: No   Other Topics Concern  . Not on file   Social History Narrative   Active, walks regularly.    Allergies  Allergen Reactions  . Pollen Extract Other (See Comments)    Sneezing and watery eyes    Family History  Problem Relation Age of Onset  . Peripheral vascular disease Father     deceased 67  . Pneumonia Mother     deceased 52  . Prostate cancer Brother   . Diabetes Brother   . Heart disease Brother   . Cancer Brother     unknown type    Prior to Admission medications   Medication Sig Start Date End Date Taking? Authorizing Provider  aspirin 81 MG EC tablet Take 81 mg by mouth daily.       Historical Provider, MD  atorvastatin (LIPITOR) 40 MG tablet Take 1 tablet (40 mg total) by mouth daily. 08/26/16   Lelon Perla, MD  calcium carbonate (OS-CAL) 600 MG TABS Take 600 mg by mouth daily.     Historical Provider, MD  carvedilol (COREG) 6.25 MG tablet Take 1 tablet (6.25 mg total) by mouth 2 (two) times daily with a meal. 01/27/16   Evans Lance, MD  cholecalciferol (VITAMIN D) 1000 UNITS tablet Take 1,000 Units by mouth daily.     Historical Provider, MD  furosemide (LASIX) 20 MG tablet Take 20 mg by mouth daily as needed for fluid.    Historical Provider, MD  levothyroxine (SYNTHROID, LEVOTHROID) 50 MCG tablet Take 1 tablet (50 mcg total) by mouth every morning. 10/20/16   Elby Showers, MD  nitroGLYCERIN (NITROSTAT) 0.4 MG SL tablet Place 1 tablet (0.4 mg total) under the tongue every 5 (five) minutes as needed. For chest pain 04/19/15   Lelon Perla, MD  omeprazole (PRILOSEC) 20 MG capsule Take 1 capsule (20 mg total) by mouth daily as needed (heart burn). 10/20/16   Elby Showers, MD  ramipril (ALTACE) 5 MG capsule TAKE 1 CAPSULE DAILY 04/27/16   Lelon Perla, MD  spironolactone (ALDACTONE) 25 MG tablet TAKE 1/2 TABLET (=12.5MG)  DAILY 12/15/16   Evans Lance, MD    Physical Exam: Vitals:   01/06/17 1300 01/06/17 1315 01/06/17 1345 01/06/17 1400  BP: (!) 91/52 (!) 89/56 (!) 97/54 97/55  Pulse: (!) 59 67 (!) 56 62  Resp: 16 14 12 15   Temp:      TempSrc:      SpO2: 95% 95% 95% 98%     General: Appears calm and comfortable Eyes: PERRLA, EOMI, normal lids, iris ENT:  grossly normal hearing, lips & tongue, mucous membranes moist and intact Neck: no lymphoadenopathy, masses or thyromegaly Cardiovascular: RRR, no m/r/g. No JVD, carotid bruits. No LE edema.  Respiratory: bilateral no wheezes, rales, rhonchi or cracles. Normal respiratory effort. No accessory muscle use observed Abdomen: soft, non-tender, non-distended, no organomegaly or masses appreciated. BS present  in all quadrants Skin: no rash, ulcers or induration seen on limited exam Musculoskeletal: grossly normal tone BUE/BLE, good ROM, no bony abnormality or joint deformities observed Psychiatric: grossly normal mood and affect, speech fluent and appropriate, alert and oriented x3 Neurologic: CN II-XII grossly intact, moves all extremities in coordinated fashion, sensation intact  Labs on Admission: I have personally reviewed following labs and imaging studies  CBC, BMP  GFR: CrCl cannot be calculated (Unknown ideal weight.).   Creatinine Clearance: CrCl cannot be calculated (Unknown ideal weight.).  Radiological Exams on Admission: Dg Chest Portable 1 View  Result Date: 01/06/2017 CLINICAL DATA:  Fever.  Hypertension and possible sepsis EXAM: PORTABLE CHEST 1 VIEW COMPARISON:  11/06/2011 FINDINGS: There is a left chest wall ICD with lead in the right ventricle. Normal heart size. No pleural effusion or edema. No airspace opacities. IMPRESSION: 1. No acute cardiopulmonary abnormalities. Electronically Signed   By: Kerby Moors M.D.   On: 01/06/2017 11:17    EKG: Independently reviewed - Sinus rhythm, no new changes  Assessment/Plan Principal Problem:   Influenza A Active Problems:   Hypertension   AKI (acute kidney injury) ([place])   Dehydration    Influenza A confirmed by PCR Start Tamiflu, monitor fever, WBC count, respiratory status and overall clinical progress Discontinue Zosyn and Vancomycin IV initiated in the ED  Dehydration d/t reduced oral intake of fluids and nutrients Patient presented with hypotension and elevated creatinine level Although his lactic acid was elevated 2.68, doubt patient was septic on admission, as on presentation he had low grade temperature (<101F), there was no tachypnea or tachycardia, WBC count was normal Lactic acidoses is probably associated with hypoperfusion statei Continue hydration and follow lactic acid trend  AKI - creatinine on  presentation 1.49 and lst one in November was 1.02 Continue hydration gently given his h/o ICM with reduced EF, hold diuretics and ACE-I Follow renal indices  Hypertension - historical diagnosis, patient presented with hypotension Hold Coreg and follow blood pressure readings    DVT prophylaxis: heparin Code Status: Full Family Communication: None  Disposition Plan: MedSurg Consults called: None Admission status: Observation   Caprice Red Pager: [phone] [facility]  If 7PM-7AM, please contact night-coverage [email or web address] Password [email or web address]  01/06/2017, 2:50 PM

## 2017-01-06 NOTE — ED Notes (Signed)
  CBG 103  

## 2017-01-07 DIAGNOSIS — J101 Influenza due to other identified influenza virus with other respiratory manifestations: Secondary | ICD-10-CM

## 2017-01-07 DIAGNOSIS — L8990 Pressure ulcer of unspecified site, unspecified stage: Secondary | ICD-10-CM | POA: Insufficient documentation

## 2017-01-07 LAB — BASIC METABOLIC PANEL
Anion gap: 5 (ref 5–15)
BUN: 14 mg/dL (ref 6–20)
CALCIUM: 8.4 mg/dL — AB (ref 8.9–10.3)
CO2: 27 mmol/L (ref 22–32)
Chloride: 106 mmol/L (ref 101–111)
Creatinine, Ser: 1.05 mg/dL (ref 0.61–1.24)
GLUCOSE: 116 mg/dL — AB (ref 65–99)
POTASSIUM: 4.1 mmol/L (ref 3.5–5.1)
Sodium: 138 mmol/L (ref 135–145)

## 2017-01-07 LAB — CBC
HEMATOCRIT: 36.5 % — AB (ref 39.0–52.0)
HEMOGLOBIN: 11.7 g/dL — AB (ref 13.0–17.0)
MCH: 29.7 pg (ref 26.0–34.0)
MCHC: 32.1 g/dL (ref 30.0–36.0)
MCV: 92.6 fL (ref 78.0–100.0)
Platelets: 109 10*3/uL — ABNORMAL LOW (ref 150–400)
RBC: 3.94 MIL/uL — AB (ref 4.22–5.81)
RDW: 14.2 % (ref 11.5–15.5)
WBC: 4.7 10*3/uL (ref 4.0–10.5)

## 2017-01-07 MED ORDER — ENSURE ENLIVE PO LIQD: 237.0000 mL | Freq: Two times a day (BID) | ORAL | 12 refills | Status: DC

## 2017-01-07 MED ORDER — OSELTAMIVIR PHOSPHATE 30 MG PO CAPS: 30.0000 mg | ORAL_CAPSULE | Freq: Two times a day (BID) | ORAL | 0 refills | Status: AC

## 2017-01-07 NOTE — Progress Notes (Signed)
Initial Nutrition Assessment  DOCUMENTATION CODES:   Not applicable  INTERVENTION:    Continue Ensure Enlive po BID, each supplement provides 350 kcal and 20 grams of protein  NUTRITION DIAGNOSIS:   Increased nutrient needs related to acute illness as evidenced by estimated needs  GOAL:   Patient will meet greater than or equal to 90% of their needs  MONITOR:   PO intake, Supplement acceptance, Labs, Weight trends, Skin, I & O's  REASON FOR ASSESSMENT:   Malnutrition Screening Tool  ASSESSMENT:   83 y.o. Male with medical history significant of Afib, s/o ICD implantation, CAD with multiple previous interventions, ICM with EF HTN, HLD, hypothyroidism, DDD who presented to the ED with c/o four day history of shaking chills, body ache and subjective fever, anorexia. THis morning the patient tried to get and became near syncopal.   Pt reports a poor appetite for about 3 days PTA. Appetite better now >> no % PO intake available per flowsheets. Typically consumes 2 meals (breakfast & lunch) and 1 snack per day. Receiving Ensure [facility] during hospital stay and likes. CBG 103.  Nutrition focused physical exam completed.  No muscle or subcutaneous fat depletion noticed.  Diet Order:  Diet Heart Room service appropriate? Yes; Fluid consistency: Thin Diet - low sodium heart healthy  Skin:  Wound (see comment) (Stage I to buttock)  Last BM:  2/14  Height:   Ht Readings from Last 1 Encounters:  01/06/17 5\' 7"  (1.702 m)    Weight:   Wt Readings from Last 1 Encounters:  01/06/17 153 lb (69.4 kg)    Ideal Body Weight:  67.2 kg  BMI:  Body mass index is 23.96 kg/m.  Estimated Nutritional Needs:   Kcal:  1700-1900  Protein:  80-90 gm  Fluid:  1.7-1.9 L  EDUCATION NEEDS:   No education needs identified at this time  Arthur Holms, RD, LDN Pager #: [phone] After-Hours Pager #: [phone]

## 2017-01-07 NOTE — Care Management Obs Status (Signed)
[place] NOTIFICATION   Patient Details  Name: Corey Jordan MRN: [id number] Date of Birth: [birth year]   Medicare Observation Status Notification Given:  Yes    Sharin Mons, RN 01/07/2017, 12:28 PM

## 2017-01-07 NOTE — Discharge Summary (Addendum)
Physician Discharge Summary  Corey Jordan [id number] DOB: [birth year] DOA: 01/06/2017  PCP: Corey Showers, MD  Admit date: 01/06/2017 Discharge date: 01/07/2017  Time spent: > 35 minutes  Recommendations for Outpatient Follow-up:  1. Monitor blood pressures and decide whether or not to continue antihypertensive medications   Discharge Diagnoses:  Principal Problem:   Influenza A Active Problems:   Hypertension   AKI (acute kidney injury) ([place])   Dehydration   Pressure injury of skin   Discharge Condition: stable  Diet recommendation: Heart healthy  Filed Weights   01/06/17 2043  Weight: 69.4 kg (153 lb)    History of present illness:  83 y/o presents with influenza A infection as well as dehydration and AKI.   Hospital Course:  Sepsis Secondary to influenza infection. Resolved with improvement in condition And administration of Tamiflu. As well as supportive therapy  Influenza A Continue Tamiflu for 4 more days to continue 5 day treatment course  Essential hypertension -hold blood pressure medications given normal blood pressures off of the medications. Will defer to primary care physician on 1 to continue  Procedures:  None  Consultations:  None  Discharge Exam: Vitals:   01/07/17 0518 01/07/17 1344  BP: (!) 119/47 123/76  Pulse: 65 67  Resp: 18 17  Temp: 97.3 F (36.3 C) 98.5 F (36.9 C)    General: Patient in no acute distress, awake and alert Cardiovascular: Regular rate and rhythm, no murmurs Respiratory: Clear to auscultation bilaterally, no wheezes  Discharge Instructions   Discharge Instructions    Call MD for:  extreme fatigue    Complete by:  As directed    Call MD for:  temperature >100.4    Complete by:  As directed    Diet - low sodium heart healthy    Complete by:  As directed    Discharge instructions    Complete by:  As directed    Please follow-up with your primary care physician within the week to see if your blood  pressure medication may be continued.   Increase activity slowly    Complete by:  As directed      Current Discharge Medication List    START taking these medications   Details  feeding supplement, ENSURE ENLIVE, (ENSURE ENLIVE) LIQD Take 237 mLs by mouth 2 (two) times daily between meals. Qty: 237 mL, Refills: 12    oseltamivir (TAMIFLU) 30 MG capsule Take 1 capsule (30 mg total) by mouth 2 (two) times daily. Qty: 6 capsule, Refills: 0      CONTINUE these medications which have NOT CHANGED   Details  aspirin 81 MG EC tablet Take 81 mg by mouth daily.      atorvastatin (LIPITOR) 40 MG tablet Take 1 tablet (40 mg total) by mouth daily. Qty: 90 tablet, Refills: 3    calcium carbonate (OS-CAL) 600 MG TABS Take 600 mg by mouth daily.     cholecalciferol (VITAMIN D) 1000 UNITS tablet Take 1,000 Units by mouth daily.     levothyroxine (SYNTHROID, LEVOTHROID) 50 MCG tablet Take 1 tablet (50 mcg total) by mouth every morning. Qty: 90 tablet, Refills: 3    loratadine (CLARITIN) 10 MG tablet Take 10 mg by mouth daily as needed for allergies.    nitroGLYCERIN (NITROSTAT) 0.4 MG SL tablet Place 1 tablet (0.4 mg total) under the tongue every 5 (five) minutes as needed. For chest pain Qty: 25 tablet, Refills: 6      STOP taking these medications  carvedilol (COREG) 6.25 MG tablet      ramipril (ALTACE) 5 MG capsule      spironolactone (ALDACTONE) 25 MG tablet        Allergies  Allergen Reactions  . Pollen Extract Other (See Comments)    Sneezing and watery eyes      The results of significant diagnostics from this hospitalization (including imaging, microbiology, ancillary and laboratory) are listed below for reference.    Significant Diagnostic Studies: Dg Chest Portable 1 View  Result Date: 01/06/2017 CLINICAL DATA:  Fever.  Hypertension and possible sepsis EXAM: PORTABLE CHEST 1 VIEW COMPARISON:  11/06/2011 FINDINGS: There is a left chest wall ICD with lead in the  right ventricle. Normal heart size. No pleural effusion or edema. No airspace opacities. IMPRESSION: 1. No acute cardiopulmonary abnormalities. Electronically Signed   By: Kerby Moors M.D.   On: 01/06/2017 11:17    Microbiology: No results found for this or any previous visit (from the past 240 hour(s)).   Labs: Basic Metabolic Panel:  Recent Labs Lab 01/06/17 1041 01/07/17 0437  NA 130* 138  K 4.1 4.1  CL 96* 106  CO2 22 27  GLUCOSE 115* 116*  BUN 17 14  CREATININE 1.49* 1.05  CALCIUM 8.8* 8.4*   Liver Function Tests:  Recent Labs Lab 01/06/17 1041  AST 30  ALT 15*  ALKPHOS 47  BILITOT 1.0  PROT 6.0*  ALBUMIN 3.4*    Recent Labs Lab 01/06/17 1041  LIPASE 24   No results for input(s): AMMONIA in the last 168 hours. CBC:  Recent Labs Lab 01/06/17 1041 01/07/17 0437  WBC 8.9 4.7  NEUTROABS 6.2  --   HGB 12.6* 11.7*  HCT 38.6* 36.5*  MCV 92.6 92.6  PLT 120* 109*   Cardiac Enzymes: No results for input(s): CKTOTAL, CKMB, CKMBINDEX, TROPONINI in the last 168 hours. BNP: BNP (last 3 results) No results for input(s): BNP in the last 8760 hours.  ProBNP (last 3 results) No results for input(s): PROBNP in the last 8760 hours.  CBG:  Recent Labs Lab 01/06/17 1044  GLUCAP 103*    Signed:  Velvet Bathe MD.  [facility] 01/07/2017, 2:51 PM  Addendum: Got called about a positive blood culture 1 or 2 with gram positive cocci. Most likely a contaminant. Discussed with patient and wife. Patient states he feels better today. Recommended that if patient were to get worse with fevers he go get checked by a physician. Wife and patient verbalize agreement and understanding.

## 2017-01-07 NOTE — Progress Notes (Signed)
[name] to be D/C'd Home per MD order.  Discussed with the patient and all questions fully answered.  VSS, IV catheter discontinued intact. Site without signs and symptoms of complications. Dressing and pressure applied.  An After Visit Summary was printed and given to the patient. Patient received prescription.  D/c education completed with patient/family including follow up instructions, medication list, d/c activities limitations if indicated, with other d/c instructions as indicated by MD - patient able to verbalize understanding, all questions fully answered.   Patient instructed to return to ED, call 911, or call MD for any changes in condition.   Patient escorted via [place], and D/C home via private auto.  Celine Ahr 01/07/2017 5:38 PM

## 2017-01-07 NOTE — Progress Notes (Signed)
PT Cancellation Note/Discharge  Patient Details Name: Corey Jordan MRN: [id number] DOB: [birth year]   Cancelled Treatment:    Reason Eval/Treat Not Completed: PT screened, no needs identified, will sign off. Pt was in bathroom when PT arrives and is able to navigate out of bathroom without an assistive device. Pt reports that he is feeling much better and doesn't feel he will require any further PT services when he returns home. Pt is under observation status at this time and option was given to complete PT evaluation. Pt requesting no evaluation and feels he will move just fine once he returns home. Please re-order if any additional needs should arise.    Scheryl Marten PT, DPT  [phone]  01/07/2017, 3:40 PM

## 2017-01-08 LAB — BLOOD CULTURE ID PANEL (REFLEXED)
Acinetobacter baumannii: NOT DETECTED
CANDIDA ALBICANS: NOT DETECTED
CANDIDA GLABRATA: NOT DETECTED
Candida krusei: NOT DETECTED
Candida parapsilosis: NOT DETECTED
Candida tropicalis: NOT DETECTED
ENTEROBACTER CLOACAE COMPLEX: NOT DETECTED
ENTEROBACTERIACEAE SPECIES: NOT DETECTED
ENTEROCOCCUS SPECIES: NOT DETECTED
Escherichia coli: NOT DETECTED
Haemophilus influenzae: NOT DETECTED
KLEBSIELLA PNEUMONIAE: NOT DETECTED
Klebsiella oxytoca: NOT DETECTED
LISTERIA MONOCYTOGENES: NOT DETECTED
Methicillin resistance: DETECTED — AB
NEISSERIA MENINGITIDIS: NOT DETECTED
Proteus species: NOT DETECTED
Pseudomonas aeruginosa: NOT DETECTED
STREPTOCOCCUS AGALACTIAE: NOT DETECTED
STREPTOCOCCUS PYOGENES: NOT DETECTED
STREPTOCOCCUS SPECIES: NOT DETECTED
Serratia marcescens: NOT DETECTED
Staphylococcus aureus (BCID): NOT DETECTED
Staphylococcus species: DETECTED — AB
Streptococcus pneumoniae: NOT DETECTED

## 2017-01-10 LAB — CULTURE, BLOOD (ROUTINE X 2)

## 2017-01-11 LAB — CULTURE, BLOOD (ROUTINE X 2): Culture: NO GROWTH

## 2017-01-14 ENCOUNTER — Encounter: Payer: Self-pay | Admitting: Internal Medicine

## 2017-01-14 ENCOUNTER — Other Ambulatory Visit: Payer: Self-pay | Admitting: Internal Medicine

## 2017-01-14 ENCOUNTER — Ambulatory Visit (INDEPENDENT_AMBULATORY_CARE_PROVIDER_SITE_OTHER): Payer: Medicare Other | Admitting: Internal Medicine

## 2017-01-14 VITALS — BP 150/80 | HR 117 | Temp 98.5°F | Ht 67.0 in | Wt 145.0 lb

## 2017-01-14 DIAGNOSIS — I252 Old myocardial infarction: Secondary | ICD-10-CM

## 2017-01-14 DIAGNOSIS — R7989 Other specified abnormal findings of blood chemistry: Secondary | ICD-10-CM

## 2017-01-14 DIAGNOSIS — I5022 Chronic systolic (congestive) heart failure: Secondary | ICD-10-CM | POA: Diagnosis not present

## 2017-01-14 DIAGNOSIS — I259 Chronic ischemic heart disease, unspecified: Secondary | ICD-10-CM

## 2017-01-14 DIAGNOSIS — J111 Influenza due to unidentified influenza virus with other respiratory manifestations: Secondary | ICD-10-CM | POA: Diagnosis not present

## 2017-01-14 LAB — CBC WITH DIFFERENTIAL/PLATELET
BASOS PCT: 0 %
Basophils Absolute: 0 cells/uL (ref 0–200)
EOS PCT: 0 %
Eosinophils Absolute: 0 cells/uL — ABNORMAL LOW (ref 15–500)
HCT: 37.1 % — ABNORMAL LOW (ref 38.5–50.0)
HEMOGLOBIN: 12.4 g/dL — AB (ref 13.2–17.1)
Lymphocytes Relative: 13 %
Lymphs Abs: 923 cells/uL (ref 850–3900)
MCH: 29.9 pg (ref 27.0–33.0)
MCHC: 33.4 g/dL (ref 32.0–36.0)
MCV: 89.4 fL (ref 80.0–100.0)
MPV: 9.7 fL (ref 7.5–12.5)
Monocytes Absolute: 1420 cells/uL — ABNORMAL HIGH (ref 200–950)
Monocytes Relative: 20 %
NEUTROS PCT: 67 %
Neutro Abs: 4757 cells/uL (ref 1500–7800)
Platelets: 384 10*3/uL (ref 140–400)
RBC: 4.15 MIL/uL — AB (ref 4.20–5.80)
RDW: 13.9 % (ref 11.0–15.0)
WBC: 7.1 10*3/uL (ref 3.8–10.8)

## 2017-01-14 LAB — BASIC METABOLIC PANEL
BUN: 18 mg/dL (ref 7–25)
CALCIUM: 9.9 mg/dL (ref 8.6–10.3)
CO2: 30 mmol/L (ref 20–31)
Chloride: 100 mmol/L (ref 98–110)
Creat: 0.98 mg/dL (ref 0.70–1.11)
GLUCOSE: 94 mg/dL (ref 65–99)
Potassium: 4.4 mmol/L (ref 3.5–5.3)
SODIUM: 139 mmol/L (ref 135–146)

## 2017-01-14 NOTE — Progress Notes (Signed)
   Subjective:    Patient ID: Corey Jordan, male    DOB: Apr 06, 1934, 83 y.o.   MRN: [id number]  HPI  83 year old male became ill at home and had near syncopal episode there. He was taken by ambulance to the hospital. Blood pressure on arrival in the emergency department was 79/49 and improved with hydration to 97/54. He was found to have elevated serum creatinine of 1.49, lactic acid of 2.68, sodium of 130. Nasal swab was positive for influenza A. For a  couple of days prior to admission he had felt alternating hot and cold sensations. He was discharged home on February 15 on Tamiflu. However some of his medications were held including Ramapril, Coreg, Aldactone. Today in the office his blood pressure is slightly elevated and he is slightly tachycardic.  When he was admitted, his creatinine was 1.49 and the following day with hydration had improved to 1.05. Bicarbonate improved from 22 to 27. Sodium improved from 130 to 138.  He has a history of coronary artery disease, grade 1 diastolic dysfunction, ejection fraction around 35%, chronic systolic congestive heart failure, hyperlipidemia, hypertension, ischemic cardiomyopathy.  He's having some soreness in his neck and shoulder areas bilaterally but he thinks it may be from trying to catch his wife who nearly fell near the dryer recently. Suggested he try heating pad and some Tylenol for this.    Review of Systems history of impaired glucose tolerance and some chronic low back pain. History of GE reflux.     Objective:   Physical Exam Skin warm and dry. Nodes none. Neck is supple without JVD thyromegaly or carotid bruits. Chest clear. Cardiac exam regular rate and rhythm normal S1 and S2. Some palpable tenderness in the sternocleidomastoid muscles bilaterally.       Assessment & Plan:  Influenza A-resolved  Volume depletion-improved-b- met pending  Coronary artery disease  Diastolic dysfunction  Chronic systolic congestive heart  failure.  Neck and shoulder pain-musculoskeletal  Plan: Basic metabolic panel reviewed. He should resume cardiac medications that were held during his hospitalization. He was contacted by telephone on February 23 with that information. Tylenol and heating pad for musculoskeletal pain.  Has appointment for physical exam here in May

## 2017-01-16 NOTE — Patient Instructions (Signed)
Keep appointment for physical exam in May. Resume cardiac medications that were held while you were in the hospital. Tylenol and heating pad for musculoskeletal neck and shoulder pain.

## 2017-01-21 ENCOUNTER — Encounter: Payer: Self-pay | Admitting: Internal Medicine

## 2017-01-21 ENCOUNTER — Ambulatory Visit
Admission: RE | Admit: 2017-01-21 | Discharge: 2017-01-21 | Disposition: A | Discharge status: Home / Self Care | Payer: Medicare Other | Source: Ambulatory Visit | Attending: Internal Medicine | Admitting: Internal Medicine

## 2017-01-21 ENCOUNTER — Ambulatory Visit (INDEPENDENT_AMBULATORY_CARE_PROVIDER_SITE_OTHER): Payer: Medicare Other | Admitting: Internal Medicine

## 2017-01-21 VITALS — BP 128/70 | HR 75 | Temp 98.4°F | Wt 146.0 lb

## 2017-01-21 DIAGNOSIS — I259 Chronic ischemic heart disease, unspecified: Secondary | ICD-10-CM | POA: Diagnosis not present

## 2017-01-21 DIAGNOSIS — J22 Unspecified acute lower respiratory infection: Secondary | ICD-10-CM

## 2017-01-21 DIAGNOSIS — R05 Cough: Secondary | ICD-10-CM

## 2017-01-21 DIAGNOSIS — R059 Cough, unspecified: Secondary | ICD-10-CM

## 2017-01-21 MED ORDER — AZITHROMYCIN 250 MG PO TABS: ORAL_TABLET | ORAL | 0 refills | Status: DC

## 2017-01-21 NOTE — Progress Notes (Signed)
   Subjective:    Patient ID: Corey Jordan, male    DOB: January 27, 1934, 83 y.o.   MRN: [id number]  HPI He still bothered by cough. He's had some discolored sputum production but indicates is more light in color than dark. No fever or shaking chills. He's been using Coricidin for cough with some success. He still feels quite weak and washed out. Not much appetite.    Review of Systems see above     Objective:   Physical Exam  Skin warm and dry. Nodes none. Neck is supple without adenopathy. Chest clear to auscultation. Cardiac exam regular rate and rhythm normal S1 and S2      Assessment & Plan:  Acute bronchitis secondary to influenza  Plan: Zithromax Z-PAK take 2 tablets day one followed by 1 tablet days 2 through 5. He is to have chest x-ray. Rest and drink plenty of fluids.   Addendum: Chest x-ray is negative

## 2017-01-21 NOTE — Patient Instructions (Signed)
Zithromax Z-PAK take 2 tablets day one followed by 1 tablet days 2 through 5. May continue with Coricidin up to 3 times daily for cough. Rest and drink plenty of fluids. Have chest x-ray.

## 2017-02-15 ENCOUNTER — Encounter: Payer: Self-pay | Admitting: Internal Medicine

## 2017-02-15 DIAGNOSIS — D2239 Melanocytic nevi of other parts of face: Secondary | ICD-10-CM | POA: Diagnosis not present

## 2017-02-15 DIAGNOSIS — C4441 Basal cell carcinoma of skin of scalp and neck: Secondary | ICD-10-CM | POA: Diagnosis not present

## 2017-02-15 DIAGNOSIS — L570 Actinic keratosis: Secondary | ICD-10-CM | POA: Diagnosis not present

## 2017-02-15 DIAGNOSIS — D220 Melanocytic nevi of lip: Secondary | ICD-10-CM | POA: Diagnosis not present

## 2017-02-15 DIAGNOSIS — Z85828 Personal history of other malignant neoplasm of skin: Secondary | ICD-10-CM | POA: Diagnosis not present

## 2017-02-15 DIAGNOSIS — D485 Neoplasm of uncertain behavior of skin: Secondary | ICD-10-CM | POA: Diagnosis not present

## 2017-02-15 DIAGNOSIS — D692 Other nonthrombocytopenic purpura: Secondary | ICD-10-CM | POA: Diagnosis not present

## 2017-02-15 DIAGNOSIS — L821 Other seborrheic keratosis: Secondary | ICD-10-CM | POA: Diagnosis not present

## 2017-02-15 DIAGNOSIS — D1801 Hemangioma of skin and subcutaneous tissue: Secondary | ICD-10-CM | POA: Diagnosis not present

## 2017-02-24 ENCOUNTER — Ambulatory Visit (INDEPENDENT_AMBULATORY_CARE_PROVIDER_SITE_OTHER): Payer: Medicare Other | Admitting: *Deleted

## 2017-02-24 DIAGNOSIS — J343 Hypertrophy of nasal turbinates: Secondary | ICD-10-CM | POA: Diagnosis not present

## 2017-02-24 DIAGNOSIS — I255 Ischemic cardiomyopathy: Secondary | ICD-10-CM

## 2017-02-24 DIAGNOSIS — J342 Deviated nasal septum: Secondary | ICD-10-CM | POA: Diagnosis not present

## 2017-02-24 DIAGNOSIS — H6122 Impacted cerumen, left ear: Secondary | ICD-10-CM | POA: Diagnosis not present

## 2017-02-24 LAB — CUP PACEART REMOTE DEVICE CHECK
Battery Remaining Longevity: 119 mo
Battery Voltage: 3.01 V
Brady Statistic RV Percent Paced: 0.12 %
Date Time Interrogation Session: 20180404041803
HighPow Impedance: 43 Ohm
HighPow Impedance: 64 Ohm
Implantable Lead Location: 753860
Implantable Pulse Generator Implant Date: 20160602
Lead Channel Impedance Value: 456 Ohm
Lead Channel Impedance Value: 475 Ohm
Lead Channel Setting Pacing Amplitude: 2.5 V
Lead Channel Setting Pacing Pulse Width: 0.4 ms
Lead Channel Setting Sensing Sensitivity: 0.3 mV
MDC IDC LEAD IMPLANT DT: 20070814
MDC IDC MSMT LEADCHNL RV PACING THRESHOLD AMPLITUDE: 0.875 V
MDC IDC MSMT LEADCHNL RV PACING THRESHOLD PULSEWIDTH: 0.4 ms
MDC IDC MSMT LEADCHNL RV SENSING INTR AMPL: 21.375 mV
MDC IDC MSMT LEADCHNL RV SENSING INTR AMPL: 21.375 mV

## 2017-02-24 NOTE — Progress Notes (Signed)
Remote ICD transmission.   

## 2017-02-25 ENCOUNTER — Encounter: Payer: Self-pay | Admitting: Cardiology

## 2017-03-10 ENCOUNTER — Encounter: Payer: Self-pay | Admitting: Internal Medicine

## 2017-03-10 DIAGNOSIS — C4441 Basal cell carcinoma of skin of scalp and neck: Secondary | ICD-10-CM | POA: Diagnosis not present

## 2017-03-10 DIAGNOSIS — Z85828 Personal history of other malignant neoplasm of skin: Secondary | ICD-10-CM | POA: Diagnosis not present

## 2017-03-22 NOTE — Progress Notes (Signed)
HPI: FU coronary artery disease. Abdominal CT in 2005 showed no aneurysm. Patient had a myocardial infarction in 2004. He had PCI of his LAD. His ejection fraction was 30%. Last catheterization in 2007 showed patent stents in the LAD. There is an 80% ostial first diagonal, occluded second diagonal and normal circumflex and right coronary artery. Ejection fraction was 30%. Patient had an ICD placed. Last echocardiogram in December 2012 showed an ejection fraction of 30-35%. There was grade 1 diastolic dysfunction. There was mild left atrial enlargement. Nuclear study May 2015 showed ejection fraction 34%. There was scar with mild peri-infarct ischemia. He has been treated medically. Since he was last seen, he denies dyspnea, chest pain, palpitations or syncope. He has had a slow recovery from the flu in February.  Current Outpatient Prescriptions  Medication Sig Dispense Refill  . aspirin 81 MG EC tablet Take 81 mg by mouth daily.      Marland Kitchen atorvastatin (LIPITOR) 40 MG tablet Take 1 tablet (40 mg total) by mouth daily. 90 tablet 3  . calcium carbonate (OS-CAL) 600 MG TABS Take 600 mg by mouth daily.     . carvedilol (COREG) 6.25 MG tablet take 1 tablet by mouth twice a day with meals 180 tablet 3  . cholecalciferol (VITAMIN D) 1000 UNITS tablet Take 1,000 Units by mouth daily.     Marland Kitchen levothyroxine (SYNTHROID, LEVOTHROID) 50 MCG tablet Take 1 tablet (50 mcg total) by mouth every morning. 90 tablet 3  . loratadine (CLARITIN) 10 MG tablet Take 10 mg by mouth daily as needed for allergies.    . nitroGLYCERIN (NITROSTAT) 0.4 MG SL tablet Place 1 tablet (0.4 mg total) under the tongue every 5 (five) minutes as needed. For chest pain 25 tablet 6  . ramipril (ALTACE) 5 MG capsule Take 5 mg by mouth daily.    Marland Kitchen spironolactone (ALDACTONE) 25 MG tablet Take 12.5 mg by mouth daily.     No current facility-administered medications for this visit.      Past Medical History:  Diagnosis Date  . Anxiety     . Atrial fibrillation ([place])   . Cardiac defibrillator in situ    s/p MDT Maximo VR single lead ICD - 07/06/2006  . 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  . Degenerative joint disease   . Depression   . H/O hiatal hernia    times 3  . History of orthostatic hypotension   . Hyperlipidemia   . Hypertension   . Hypothyroidism   . Ischemic cardiomyopathy    EF 40% 12/2008 V gram;  Echocardiogram 11/08/11: EF 30-35%, inferoseptal and anteroseptal, apical septal/lateral/anterior/inferior and true apical akinesis, no LV thrombus, grade 1 diastolic dysfunction.  . Lumbar spondylosis    s/p L5-S1 fusion 12/11/2009  . Premature atrial contractions   . Premature ventricular contractions   . Skin cancer    skin  . Systolic CHF, chronic ([place])     Past Surgical History:  Procedure Laterality Date  . BACK SURGERY     x 2  . EP IMPLANTABLE DEVICE [name] 04/25/2015   Procedure: ICD Generator Changeout;  Surgeon: Evans Lance, MD;  Location: [facility]  CV LAB;  Service: Cardiovascular;  Laterality: [name];  . icd     s/p MDT Maximo VR single lead ICD - 07/06/2006  . INGUINAL HERNIA REPAIR Left    x 3  . LEFT HEART CATHETERIZATION WITH CORONARY ANGIOGRAM [name] 11/09/2011   Procedure: LEFT HEART CATHETERIZATION WITH CORONARY ANGIOGRAM;  Surgeon: Josue Hector, MD;  Location: [facility] CATH LAB;  Service: Cardiovascular;  Laterality: [name];  . LUMBAR FUSION     s/p L5-S1 fusion 12/11/2009  . MOHS SURGERY     x 3, head nose  . SKIN CANCER EXCISION      Social History   Social History  . Marital status: Married    Spouse name: [name]  . Number of children: 2  . Years of education: [name]   Occupational History  . retired  [name]   Social History Main Topics  . Smoking status: Never Smoker  . Smokeless tobacco: Never Used  . Alcohol use No  . Drug use: No  . Sexual activity: No   Other Topics Concern  . Not on file   Social History Narrative   Active, walks regularly.    Family History  Problem Relation Age of Onset  . Peripheral vascular disease Father     deceased 92  . Pneumonia Mother     deceased 63  . Prostate cancer Brother   . Diabetes Brother   . Heart disease Brother   . Cancer Brother     unknown type    ROS: no fevers or chills, productive cough, hemoptysis, dysphasia, odynophagia, melena, hematochezia, dysuria, hematuria, rash, seizure activity, orthopnea, PND, pedal edema, claudication. Remaining systems are negative.  Physical Exam: Well-developed well-nourished in no acute distress.  Skin is warm and dry.  HEENT is normal.  Neck is supple. No bruits Chest is clear to auscultation with normal expansion.  Cardiovascular exam is regular rate and rhythm. No murmurs Abdominal exam nontender or distended. No masses palpated. Extremities show no edema. neuro grossly intact  A/P  1 Coronary artery disease-continue aspirin and statin.  2 chronic systolic congestive heart failure-patient appears to be euvolemic. Continue present dose of diuretics.  3 hypertension-blood pressure controlled. Continue present medications.  4 hyperlipidemia-continue statin. Lipids and liver monitored by primary care.  5 ischemic cardiomyopathy-continue beta blocker and ACE inhibitor.  6 prior ICD-followed by electrophysiology.   Kirk Ruths, MD

## 2017-03-25 ENCOUNTER — Encounter: Payer: Self-pay | Admitting: Cardiology

## 2017-03-25 ENCOUNTER — Ambulatory Visit (INDEPENDENT_AMBULATORY_CARE_PROVIDER_SITE_OTHER): Payer: Medicare Other | Admitting: Cardiology

## 2017-03-25 VITALS — BP 118/76 | HR 72 | Ht 67.5 in | Wt 148.0 lb

## 2017-03-25 DIAGNOSIS — I259 Chronic ischemic heart disease, unspecified: Secondary | ICD-10-CM | POA: Diagnosis not present

## 2017-03-25 DIAGNOSIS — I2510 Atherosclerotic heart disease of native coronary artery without angina pectoris: Secondary | ICD-10-CM | POA: Diagnosis not present

## 2017-03-25 DIAGNOSIS — I5022 Chronic systolic (congestive) heart failure: Secondary | ICD-10-CM

## 2017-03-25 DIAGNOSIS — I10 Essential (primary) hypertension: Secondary | ICD-10-CM | POA: Diagnosis not present

## 2017-03-25 DIAGNOSIS — E7800 Pure hypercholesterolemia, unspecified: Secondary | ICD-10-CM | POA: Diagnosis not present

## 2017-03-25 NOTE — Patient Instructions (Signed)
Your physician wants you to follow-up in: 6 MONTHS WITH DR CRENSHAW You will receive a reminder letter in the mail two months in advance. If you don't receive a letter, please call our office to schedule the follow-up appointment.   If you need a refill on your cardiac medications before your next appointment, please call your pharmacy.  

## 2017-04-15 ENCOUNTER — Other Ambulatory Visit: Payer: Medicare Other | Admitting: Internal Medicine

## 2017-04-15 ENCOUNTER — Other Ambulatory Visit: Payer: Self-pay | Admitting: Internal Medicine

## 2017-04-15 DIAGNOSIS — I2510 Atherosclerotic heart disease of native coronary artery without angina pectoris: Secondary | ICD-10-CM | POA: Diagnosis not present

## 2017-04-15 DIAGNOSIS — R7302 Impaired glucose tolerance (oral): Secondary | ICD-10-CM | POA: Diagnosis not present

## 2017-04-15 DIAGNOSIS — I5022 Chronic systolic (congestive) heart failure: Secondary | ICD-10-CM

## 2017-04-15 DIAGNOSIS — E039 Hypothyroidism, unspecified: Secondary | ICD-10-CM | POA: Diagnosis not present

## 2017-04-15 DIAGNOSIS — N400 Enlarged prostate without lower urinary tract symptoms: Secondary | ICD-10-CM | POA: Diagnosis not present

## 2017-04-15 DIAGNOSIS — Z0000 Encounter for general adult medical examination without abnormal findings: Secondary | ICD-10-CM | POA: Diagnosis not present

## 2017-04-15 DIAGNOSIS — E7800 Pure hypercholesterolemia, unspecified: Secondary | ICD-10-CM | POA: Diagnosis not present

## 2017-04-15 DIAGNOSIS — R6510 Systemic inflammatory response syndrome (SIRS) of non-infectious origin without acute organ dysfunction: Secondary | ICD-10-CM | POA: Diagnosis not present

## 2017-04-15 LAB — CBC
HCT: 40.2 % (ref 38.5–50.0)
Hemoglobin: 13.1 g/dL — ABNORMAL LOW (ref 13.2–17.1)
MCH: 29.7 pg (ref 27.0–33.0)
MCHC: 32.6 g/dL (ref 32.0–36.0)
MCV: 91.2 fL (ref 80.0–100.0)
MPV: 9.9 fL (ref 7.5–12.5)
PLATELETS: 231 10*3/uL (ref 140–400)
RBC: 4.41 MIL/uL (ref 4.20–5.80)
RDW: 14.1 % (ref 11.0–15.0)
WBC: 5.3 10*3/uL (ref 3.8–10.8)

## 2017-04-15 LAB — COMPLETE METABOLIC PANEL WITH GFR
ALBUMIN: 4 g/dL (ref 3.6–5.1)
ALK PHOS: 69 U/L (ref 40–115)
ALT: 13 U/L (ref 9–46)
AST: 18 U/L (ref 10–35)
BILIRUBIN TOTAL: 0.6 mg/dL (ref 0.2–1.2)
BUN: 17 mg/dL (ref 7–25)
CALCIUM: 9.7 mg/dL (ref 8.6–10.3)
CO2: 32 mmol/L — ABNORMAL HIGH (ref 20–31)
Chloride: 103 mmol/L (ref 98–110)
Creat: 1.04 mg/dL (ref 0.70–1.11)
GFR, EST AFRICAN AMERICAN: 77 mL/min (ref >=60)
GFR, EST NON AFRICAN AMERICAN: 67 mL/min (ref >=60)
Glucose, Bld: 96 mg/dL (ref 65–99)
POTASSIUM: 4.7 mmol/L (ref 3.5–5.3)
Sodium: 139 mmol/L (ref 135–146)
TOTAL PROTEIN: 6.2 g/dL (ref 6.1–8.1)

## 2017-04-15 LAB — LIPID PANEL
CHOL/HDL RATIO: 1.9 ratio (ref <5.0)
CHOLESTEROL: 138 mg/dL (ref <200)
HDL: 72 mg/dL (ref >40)
LDL Cholesterol: 56 mg/dL (ref <100)
TRIGLYCERIDES: 52 mg/dL (ref <150)
VLDL: 10 mg/dL (ref <30)

## 2017-04-15 LAB — MICROALBUMIN / CREATININE URINE RATIO
CREATININE, URINE: 140 mg/dL (ref 20–370)
MICROALB/CREAT RATIO: 4 ug/mg{creat} (ref <30)
Microalb, Ur: 0.5 mg/dL

## 2017-04-15 LAB — TSH: TSH: 2.22 mIU/L (ref 0.40–4.50)

## 2017-04-16 LAB — HEMOGLOBIN A1C
HEMOGLOBIN A1C: 5.6 % (ref <5.7)
MEAN PLASMA GLUCOSE: 114 mg/dL

## 2017-04-20 ENCOUNTER — Ambulatory Visit (INDEPENDENT_AMBULATORY_CARE_PROVIDER_SITE_OTHER): Payer: Medicare Other | Admitting: Internal Medicine

## 2017-04-20 ENCOUNTER — Ambulatory Visit: Payer: Medicare Other | Admitting: Internal Medicine

## 2017-04-20 VITALS — BP 142/76 | HR 57 | Temp 97.6°F | Ht 67.0 in | Wt 149.0 lb

## 2017-04-20 DIAGNOSIS — I5022 Chronic systolic (congestive) heart failure: Secondary | ICD-10-CM | POA: Diagnosis not present

## 2017-04-20 DIAGNOSIS — R059 Cough, unspecified: Secondary | ICD-10-CM

## 2017-04-20 DIAGNOSIS — E784 Other hyperlipidemia: Secondary | ICD-10-CM

## 2017-04-20 DIAGNOSIS — I259 Chronic ischemic heart disease, unspecified: Secondary | ICD-10-CM | POA: Diagnosis not present

## 2017-04-20 DIAGNOSIS — I255 Ischemic cardiomyopathy: Secondary | ICD-10-CM | POA: Diagnosis not present

## 2017-04-20 DIAGNOSIS — K219 Gastro-esophageal reflux disease without esophagitis: Secondary | ICD-10-CM | POA: Diagnosis not present

## 2017-04-20 DIAGNOSIS — I5189 Other ill-defined heart diseases: Secondary | ICD-10-CM

## 2017-04-20 DIAGNOSIS — E7849 Other hyperlipidemia: Secondary | ICD-10-CM

## 2017-04-20 DIAGNOSIS — Z0000 Encounter for general adult medical examination without abnormal findings: Secondary | ICD-10-CM | POA: Diagnosis not present

## 2017-04-20 DIAGNOSIS — I10 Essential (primary) hypertension: Secondary | ICD-10-CM

## 2017-04-20 DIAGNOSIS — I252 Old myocardial infarction: Secondary | ICD-10-CM | POA: Diagnosis not present

## 2017-04-20 DIAGNOSIS — R05 Cough: Secondary | ICD-10-CM

## 2017-04-20 DIAGNOSIS — I519 Heart disease, unspecified: Secondary | ICD-10-CM | POA: Diagnosis not present

## 2017-04-20 DIAGNOSIS — R7302 Impaired glucose tolerance (oral): Secondary | ICD-10-CM | POA: Diagnosis not present

## 2017-04-20 DIAGNOSIS — Z95810 Presence of automatic (implantable) cardiac defibrillator: Secondary | ICD-10-CM | POA: Diagnosis not present

## 2017-04-20 LAB — POCT URINALYSIS DIPSTICK
Bilirubin, UA: NEGATIVE
Glucose, UA: NEGATIVE
Ketones, UA: NEGATIVE
Leukocytes, UA: NEGATIVE
Nitrite, UA: NEGATIVE
PH UA: 5.5 (ref 5.0–8.0)
PROTEIN UA: NEGATIVE
RBC UA: NEGATIVE
SPEC GRAV UA: 1.015 (ref 1.010–1.025)
UROBILINOGEN UA: 0.2 U/dL

## 2017-04-20 MED ORDER — AZITHROMYCIN 250 MG PO TABS: ORAL_TABLET | ORAL | 0 refills | Status: DC

## 2017-04-20 NOTE — Progress Notes (Signed)
Subjective:    Patient ID: Corey Jordan, male    DOB: 1934/04/18, 83 y.o.   MRN: [id number]  HPI 83 year old White male in today for health maintenance exam and evaluation of medical issues. Says he has not felt all that well since he had a bout of influenza A that was confirmed by PCR  with  volume depletion requiring admission to the hospital February 2018. He had acute kidney injury with creatinine 1.49 that resolved within 24 hours. He did not have pneumonia.  He has a history of coronary artery disease and saw Dr. Stanford Breed on May 3. He felt he was doing well and did not change any medications. He is to be seen again in 6 months by Dr. Stanford Breed. He has no chest pain or shortness of breath.  He hasn't internal cardiac defibrillator followed by Dr. Lovena Le. He has a history of chronic ischemic heart disease. History of ischemic cardiomyopathy with ejection fraction of 30% on cardiac catheterization August 2007. History of class II congestive heart failure. History of implantation of med Tronic cardioverter defibrillator August 2007. Generator was changed out June 2016.  He had a myocardial infarction in 2004 with total occlusion of the LAD which was stented 2. Right coronary artery had 40% proximal lesion. Circumflex was normal. Patient had 80% diagonal lesion. Was noted at that time to have ejection fraction between 30 and 35%.  He has a history of lumbar disc disease. History of spondylosis and herniated disc with left radiculopathy L5 and S1 status post L5-S1 fusion by Dr. Carloyn Manner January 2011.  History of hypertension, hypothyroidism, hyperlipidemia, GE reflux.  Colonoscopy by Dr. Deatra Ina October 2005. Saw Dr. Loletha Carrow for rectal bleeding August 2017. Stool cards were positive prior to rectal bleeding developing. He is scheduled for colonoscopy but changed his mind and canceled it. We will provide him with another set of Hemoccult cards.  History of left inguinal hernia repair in 2000 by Dr.  Rebekah Chesterfield.  All vaccines are up-to-date. Zostavax 2011, tetanus 2011, Prevnar and Pneumovax have been previously given.  Social history: He is retired from the [employer]. Married with 2 adult children. Does not smoke or consume alcohol.  Family history: Mother died of pneumonia at age 3. Father died of heart failure at age 46. Father had history of venous thrombosis. Brother died at age 1 of cancer.    Review of Systems  Constitutional: Positive for fatigue.  Respiratory: Positive for cough.        Cough with slight discolored sputum production for a couple of weeks  Cardiovascular: Negative for chest pain.  Gastrointestinal: Negative.   Genitourinary: Negative.   Neurological: Negative.   Psychiatric/Behavioral: Negative.        Objective:   Physical Exam  Constitutional: He appears well-developed and well-nourished. No distress.  Cardiovascular: Normal rate, regular rhythm, normal heart sounds and intact distal pulses.   No murmur heard. Genitourinary: Prostate normal.  Skin: He is not diaphoretic.  Vitals reviewed.         Assessment & Plan:  Ischemic cardiomyopathy with implantable cardio defibrillator-stable  History of MI  History of L5-S1 fusion  Essential hypertension-stable  Hypothyroidism-TSH within normal limits on thyroid replacement medication  Hyperlipidemia-remains on statin and lipid panel normal  GE reflux- treated with PPI  Impaired glucose tolerance- hemogobin A1C within normal limits  Productive cough-treated with Zithromax Z-PAK today  Rectal bleeding in 2017 but patient canceled colonoscopy. Hemoccult cards were positive last year. He will be given another  set.  Plan: Continue same medications and return in 6 months. Zithromax Z-PAK prescribed for cough that is productive.  Subjective:   Patient presents for Medicare Annual/Subsequent preventive examination.  Review Past Medical/Family/Social:See above   Risk Factors  Current  exercise habits:Sedentary  Dietary issues discussed: Low fat low carbohydrate  Cardiac risk factors:Family history, hypertension, hyperlipidemia, history of coronary artery disease  Depression Screen  (Note: if answer to either of the following is "Yes", a more complete depression screening is indicated)   Over the past two weeks, have you felt down, depressed or hopeless? No  Over the past two weeks, have you felt little interest or pleasure in doing things? No Have you lost interest or pleasure in daily life? No Do you often feel hopeless? No Do you cry easily over simple problems? No   Activities of Daily Living  In your present state of health, do you have any difficulty performing the following activities?:   Driving? No  Managing money? No  Feeding yourself? No  Getting from bed to chair? No  Climbing a flight of stairs? No  Preparing food and eating?: No  Bathing or showering? No  Getting dressed: No  Getting to the toilet? No  Using the toilet:No  Moving around from place to place: No  In the past year have you fallen or had a near fall?:No  Are you sexually active? yes Do you have more than one partner? No   Hearing Difficulties: No  Do you often ask people to speak up or repeat themselves? No  Do you experience ringing or noises in your ears? No  Do you have difficulty understanding soft or whispered voices? No  Do you feel that you have a problem with memory? No Do you often misplace items? No    Home Safety:  Do you have a smoke alarm at your residence? Yes Do you have grab bars in the bathroom?No Do you have throw rugs in your house? No   Cognitive Testing  Alert? Yes Normal Appearance?Yes  Oriented to person? Yes Place? Yes  Time? Yes  Recall of three objects? Yes  Can perform simple calculations? Yes  Displays appropriate judgment?Yes  Can read the correct time from a watch face?Yes   List the Names of Other Physician/Practitioners you currently  use:  See referral list for the physicians patient is currently seeing.  Cardiologist   Review of Systems: See above   Objective:     General appearance: Appears stated age and thin. Head: Normocephalic, without obvious abnormality, atraumatic  Eyes: conj clear, EOMi PEERLA  Ears: normal TM's and external ear canals both ears  Nose: Nares normal. Septum midline. Mucosa normal. No drainage or sinus tenderness.  Throat: lips, mucosa, and tongue normal; teeth and gums normal  Neck: no adenopathy, no carotid bruit, no JVD, supple, symmetrical, trachea midline and thyroid not enlarged, symmetric, no tenderness/mass/nodules  No CVA tenderness.  Lungs: clear to auscultation bilaterally  Breasts: normal appearance, no masses or tenderness, t Heart: regular rate and rhythm, S1, S2 normal, no murmur, click, rub or gallop  Abdomen: soft, non-tender; bowel sounds normal; no masses, no organomegaly  Musculoskeletal: ROM normal in all joints, no crepitus, no deformity, Normal muscle strengthen. Back  is symmetric, no curvature. Skin: Skin color, texture, turgor normal. No rashes or lesions  Lymph nodes: Cervical, supraclavicular, and axillary nodes normal.  Neurologic: CN 2 -12 Normal, Normal symmetric reflexes. Normal coordination and gait  Psych: Alert & Oriented x 3,  Mood appear stable.    Assessment:    Annual wellness medicare exam   Plan:    During the course of the visit the patient was educated and counseled about appropriate screening and preventive services including:   Annual PSA  Annual flu vaccine     Patient Instructions (the written plan) was given to the patient.  Medicare Attestation  I have personally reviewed:  The patient's medical and social history  Their use of alcohol, tobacco or illicit drugs  Their current medications and supplements  The patient's functional ability including ADLs,fall risks, home safety risks, cognitive, and hearing and visual  impairment  Diet and physical activities  Evidence for depression or mood disorders  The patient's weight, height, BMI, and visual acuity have been recorded in the chart. I have made referrals, counseling, and provided education to the patient based on review of the above and I have provided the patient with a written personalized care plan for preventive services.

## 2017-04-21 ENCOUNTER — Encounter: Payer: Self-pay | Admitting: Internal Medicine

## 2017-04-21 LAB — PSA: PSA: 3.7 ng/mL (ref <=4.0)

## 2017-04-21 NOTE — Patient Instructions (Addendum)
It was a pleasure to see you today. Please do Hemoccult cards and return them to the office. Continue same medications and return in 6 months. Take Zithromax Z-PAK for cough.

## 2017-04-27 ENCOUNTER — Other Ambulatory Visit: Payer: Self-pay | Admitting: Cardiology

## 2017-04-30 ENCOUNTER — Other Ambulatory Visit (INDEPENDENT_AMBULATORY_CARE_PROVIDER_SITE_OTHER): Payer: Medicare Other | Admitting: Internal Medicine

## 2017-04-30 DIAGNOSIS — Z1211 Encounter for screening for malignant neoplasm of colon: Secondary | ICD-10-CM

## 2017-04-30 LAB — HEMOCCULT GUIAC POC 1CARD (OFFICE)
Card #2 Fecal Occult Blod, POC: NEGATIVE
FECAL OCCULT BLD: NEGATIVE
Fecal Occult Blood, POC: NEGATIVE

## 2017-04-30 NOTE — Progress Notes (Signed)
Fecal Hemoccult cards that had been sent to the patient arrived back today in the office and were tested. All results were negative.  Lenise Arena, CMA

## 2017-04-30 NOTE — Patient Instructions (Signed)
All stool tests came back negative.

## 2017-05-13 ENCOUNTER — Encounter: Payer: Self-pay | Admitting: Internal Medicine

## 2017-05-27 ENCOUNTER — Ambulatory Visit (INDEPENDENT_AMBULATORY_CARE_PROVIDER_SITE_OTHER): Payer: Medicare Other | Admitting: *Deleted

## 2017-05-27 DIAGNOSIS — I5022 Chronic systolic (congestive) heart failure: Secondary | ICD-10-CM

## 2017-05-27 DIAGNOSIS — I255 Ischemic cardiomyopathy: Secondary | ICD-10-CM

## 2017-05-28 LAB — CUP PACEART REMOTE DEVICE CHECK
Battery Remaining Longevity: 117 mo
Battery Voltage: 3 V
Brady Statistic RV Percent Paced: 0.2 %
HighPow Impedance: 41 Ohm
HighPow Impedance: 56 Ohm
Implantable Lead Model: 6947
Implantable Pulse Generator Implant Date: 20160602
Lead Channel Impedance Value: 418 Ohm
Lead Channel Impedance Value: 456 Ohm
Lead Channel Sensing Intrinsic Amplitude: 24.375 mV
Lead Channel Setting Pacing Amplitude: 2.5 V
Lead Channel Setting Pacing Pulse Width: 0.4 ms
Lead Channel Setting Sensing Sensitivity: 0.3 mV
MDC IDC LEAD IMPLANT DT: 20070814
MDC IDC LEAD LOCATION: 753860
MDC IDC MSMT LEADCHNL RV PACING THRESHOLD AMPLITUDE: 1 V
MDC IDC MSMT LEADCHNL RV PACING THRESHOLD PULSEWIDTH: 0.4 ms
MDC IDC MSMT LEADCHNL RV SENSING INTR AMPL: 24.375 mV
MDC IDC SESS DTM: 20180705071713

## 2017-05-28 NOTE — Progress Notes (Signed)
Remote ICD transmission.   

## 2017-06-01 ENCOUNTER — Encounter: Payer: Self-pay | Admitting: Internal Medicine

## 2017-06-01 ENCOUNTER — Ambulatory Visit (INDEPENDENT_AMBULATORY_CARE_PROVIDER_SITE_OTHER): Payer: Medicare Other | Admitting: Internal Medicine

## 2017-06-01 ENCOUNTER — Encounter: Payer: Medicare Other | Admitting: Internal Medicine

## 2017-06-01 ENCOUNTER — Encounter (INDEPENDENT_AMBULATORY_CARE_PROVIDER_SITE_OTHER): Payer: Self-pay

## 2017-06-01 VITALS — BP 134/78 | HR 56 | Ht 68.0 in | Wt 149.4 lb

## 2017-06-01 DIAGNOSIS — I5022 Chronic systolic (congestive) heart failure: Secondary | ICD-10-CM

## 2017-06-01 DIAGNOSIS — I255 Ischemic cardiomyopathy: Secondary | ICD-10-CM

## 2017-06-01 DIAGNOSIS — I2589 Other forms of chronic ischemic heart disease: Secondary | ICD-10-CM

## 2017-06-01 DIAGNOSIS — I259 Chronic ischemic heart disease, unspecified: Secondary | ICD-10-CM

## 2017-06-01 LAB — CUP PACEART INCLINIC DEVICE CHECK
Brady Statistic RV Percent Paced: 0.15 %
HighPow Impedance: 43 Ohm
HighPow Impedance: 62 Ohm
Implantable Lead Implant Date: 20070814
Implantable Lead Location: 753860
Implantable Lead Model: 6947
Implantable Pulse Generator Implant Date: 20160602
Lead Channel Impedance Value: 418 Ohm
Lead Channel Pacing Threshold Amplitude: 1 V
Lead Channel Pacing Threshold Pulse Width: 0.4 ms
Lead Channel Setting Pacing Amplitude: 2.5 V
Lead Channel Setting Pacing Pulse Width: 0.4 ms
Lead Channel Setting Sensing Sensitivity: 0.3 mV
MDC IDC MSMT BATTERY REMAINING LONGEVITY: 117 mo
MDC IDC MSMT BATTERY VOLTAGE: 3.01 V
MDC IDC MSMT LEADCHNL RV IMPEDANCE VALUE: 475 Ohm
MDC IDC MSMT LEADCHNL RV SENSING INTR AMPL: 24 mV
MDC IDC MSMT LEADCHNL RV SENSING INTR AMPL: 24.25 mV
MDC IDC SESS DTM: 20180710121525

## 2017-06-01 MED ORDER — SPIRONOLACTONE 25 MG PO TABS: 12.5000 mg | ORAL_TABLET | Freq: Every day | ORAL | 3 refills | Status: DC

## 2017-06-01 NOTE — Progress Notes (Signed)
HPI Mr. Kucinski returns today for followup. He is a very pleasant 83 year old man with an ischemic cardiomyopathy, chronic systolic heart failure, VT, status post ICD implantation. He is active, and has undergone ICD system generator change with pocket relocation to a subpectoral location. He has no chest pain or sob. No ICD shock.  He denies dietary or medical indiscretion or noncompliance. Allergies  Allergen Reactions  . Pollen Extract Other (See Comments)    Sneezing and watery eyes     Current Outpatient Prescriptions  Medication Sig Dispense Refill  . aspirin 81 MG EC tablet Take 81 mg by mouth daily.      Marland Kitchen atorvastatin (LIPITOR) 40 MG tablet Take 1 tablet (40 mg total) by mouth daily. 90 tablet 3  . calcium carbonate (OS-CAL) 600 MG TABS Take 600 mg by mouth daily.     . carvedilol (COREG) 6.25 MG tablet take 1 tablet by mouth twice a day with meals 180 tablet 3  . cholecalciferol (VITAMIN D) 1000 UNITS tablet Take 1,000 Units by mouth daily.     Marland Kitchen levothyroxine (SYNTHROID, LEVOTHROID) 50 MCG tablet Take 1 tablet (50 mcg total) by mouth every morning. 90 tablet 3  . loratadine (CLARITIN) 10 MG tablet Take 10 mg by mouth daily as needed for allergies.    . nitroGLYCERIN (NITROSTAT) 0.4 MG SL tablet Place 1 tablet (0.4 mg total) under the tongue every 5 (five) minutes as needed. For chest pain 25 tablet 6  . omeprazole (PRILOSEC) 20 MG capsule Take 20 mg by mouth daily as needed (heartburn).   0  . ramipril (ALTACE) 5 MG capsule Take 5 mg by mouth daily.    Marland Kitchen spironolactone (ALDACTONE) 25 MG tablet Take 0.5 tablets (12.5 mg total) by mouth daily. 45 tablet 3   No current facility-administered medications for this visit.      Past Medical History:  Diagnosis Date  . Anxiety   . Atrial fibrillation ([facility])   . Cardiac defibrillator in situ    s/p MDT Maximo VR single lead ICD - 07/06/2006  . 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  . Degenerative joint disease   . Depression   . H/O hiatal hernia    times 3  . History of orthostatic hypotension   . Hyperlipidemia   . Hypertension   . Hypothyroidism   . Ischemic cardiomyopathy    EF 40% 12/2008 V gram;  Echocardiogram 11/08/11: EF 30-35%, inferoseptal and anteroseptal, apical septal/lateral/anterior/inferior and true apical akinesis, no LV thrombus, grade 1 diastolic dysfunction.  . Lumbar spondylosis    s/p L5-S1 fusion 12/11/2009  . Premature atrial contractions   . Premature ventricular contractions   . Skin cancer    skin  . Systolic CHF, chronic (HCC)     ROS:   All systems reviewed and negative except as noted in the HPI.   Past Surgical History:  Procedure Laterality Date  . BACK SURGERY     x 2  . EP IMPLANTABLE DEVICE [name] 04/25/2015   Procedure: ICD Generator Changeout;  Surgeon: Evans Lance, MD;  Location: [facility];  Service: Cardiovascular;  Laterality: [name];  . icd  s/p MDT Maximo VR single lead ICD - 07/06/2006  . INGUINAL HERNIA REPAIR Left    x 3  . LEFT HEART CATHETERIZATION WITH CORONARY ANGIOGRAM [name] 11/09/2011   Procedure: LEFT HEART CATHETERIZATION WITH CORONARY ANGIOGRAM;  Surgeon: Josue Hector, MD;  Location: [facility];  Service: Cardiovascular;  Laterality: [name];  . LUMBAR FUSION     s/p L5-S1 fusion 12/11/2009  . MOHS SURGERY     x 3, head nose  . SKIN CANCER EXCISION       Family History  Problem Relation Age of Onset  . Peripheral vascular disease Father        deceased 23  . Pneumonia Mother        deceased 5  . Prostate cancer Brother   . Diabetes Brother   . Heart disease Brother   . Cancer Brother        unknown type     Social History   Social  History  . Marital status: Married    Spouse name: [name]  . Number of children: 2  . Years of education: [name]   Occupational History  . retired [name]   Social History Main Topics  . Smoking status: Never Smoker  . Smokeless tobacco: Never Used  . Alcohol use No  . Drug use: No  . Sexual activity: No   Other Topics Concern  . Not on file   Social History Narrative   Active, walks regularly.     BP 134/78   Pulse (!) 56   Ht 5\' 8"  (1.727 m)   Wt 149 lb 6.4 oz (67.8 kg)   SpO2 96%   BMI 22.72 kg/m   Physical Exam:  Well appearing 83 year old man, NAD HEENT: Unremarkable Neck:  7 cm JVD, no thyromegally Back:  No CVA tenderness Lungs:  Clear with no wheezes, rales, or rhonchi. Well healed ICD incision HEART:  Regular rate rhythm, no murmurs, no rubs, no clicks Abd:  soft, positive bowel sounds, no organomegally, no rebound, no guarding Ext:  2 plus pulses, no edema, no cyanosis, no clubbing Skin:  No rashes no nodules Neuro:  CN II through XII intact, motor grossly intact  ICD interogation - normal medtronic ICD function  Assess/Plan: 1. VT - he has had rare episodes of NSVT. Will continue his current meds. 2. Chronic systolic heart failure - his symptoms are well controlled.  3. CAD - he denies anginal symptoms. Will follow. 4. ICD - his medtronic device is working normally. Will recheck in several months.  Mikle Bosworth.D.

## 2017-06-01 NOTE — Patient Instructions (Signed)

## 2017-06-04 ENCOUNTER — Encounter: Payer: Self-pay | Admitting: Cardiology

## 2017-06-09 DIAGNOSIS — J342 Deviated nasal septum: Secondary | ICD-10-CM | POA: Diagnosis not present

## 2017-06-09 DIAGNOSIS — H6122 Impacted cerumen, left ear: Secondary | ICD-10-CM | POA: Diagnosis not present

## 2017-07-05 ENCOUNTER — Ambulatory Visit (INDEPENDENT_AMBULATORY_CARE_PROVIDER_SITE_OTHER): Payer: Medicare Other | Admitting: Internal Medicine

## 2017-07-05 ENCOUNTER — Encounter: Payer: Self-pay | Admitting: Internal Medicine

## 2017-07-05 ENCOUNTER — Ambulatory Visit
Admission: RE | Admit: 2017-07-05 | Discharge: 2017-07-05 | Disposition: A | Discharge status: Home / Self Care | Payer: Medicare Other | Source: Ambulatory Visit | Attending: Internal Medicine | Admitting: Internal Medicine

## 2017-07-05 DIAGNOSIS — M25562 Pain in left knee: Secondary | ICD-10-CM | POA: Diagnosis not present

## 2017-07-05 DIAGNOSIS — M542 Cervicalgia: Secondary | ICD-10-CM

## 2017-07-05 DIAGNOSIS — S199XXA Unspecified injury of neck, initial encounter: Secondary | ICD-10-CM | POA: Diagnosis not present

## 2017-07-05 DIAGNOSIS — I259 Chronic ischemic heart disease, unspecified: Secondary | ICD-10-CM

## 2017-07-05 DIAGNOSIS — S8992XA Unspecified injury of left lower leg, initial encounter: Secondary | ICD-10-CM | POA: Diagnosis not present

## 2017-07-05 NOTE — Patient Instructions (Signed)
Apply ice to neck and left knee 20 minutes twice daily. May take Tylenol for pain. Call if not better in 2 weeks or sooner if worse. Have C-spine and left knee x-ray
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
## 2017-07-07 ENCOUNTER — Telehealth: Payer: Self-pay

## 2017-07-07 MED ORDER — CYCLOBENZAPRINE HCL 10 MG PO TABS: 10.0000 mg | ORAL_TABLET | Freq: Two times a day (BID) | ORAL | 0 refills | Status: DC | PRN

## 2017-07-07 NOTE — Telephone Encounter (Signed)
Pt is aware.  

## 2017-07-07 NOTE — Telephone Encounter (Signed)
Flexeril 10 mg #15  only take one half tablet twice a day- may cause drowsiness

## 2017-07-07 NOTE — Telephone Encounter (Signed)
Pt called to ask if he could get the muscle relaxer discussed at the visit on Monday, he is feeling more stiffness and pain and feels he needs it now more than he did at the visit. Please advise

## 2017-07-07 NOTE — Telephone Encounter (Signed)
LMTCB

## 2017-07-19 ENCOUNTER — Telehealth: Payer: Self-pay | Admitting: Internal Medicine

## 2017-07-19 NOTE — Telephone Encounter (Signed)
Patient is calling; states that his neck is still hurting.  Says that it is not any better.  In fact, the left side is the worst, but the entire neck still feels swollen.  You had told him if it wasn't better in 2 weeks to call back.  So, he's calling back to see what you want him to do at this point.  Still has muscle relaxers left.  He has just been using 1/2 pill at night when he goes to bed.  He couldn't really tell a difference.  He never tried the whole pill because he didn't want to be dizzy or risk falling down.    And, he did say that you had talked about a neck brace.  And, he wasn't really crazy about wearing that initially, however, if you think that will help and make his neck feel better, he would be willing to wear it.    Best number for contact:  [phone]  Pharmacy:  [facility] on [address]

## 2017-07-19 NOTE — Telephone Encounter (Signed)
Please arrange for him to go to physical therapy for 2 weeks

## 2017-07-19 NOTE — Telephone Encounter (Signed)
Pt is aware.  

## 2017-07-19 NOTE — Telephone Encounter (Signed)
Faxed to [facility] and LVM for pt to call office back

## 2017-08-12 ENCOUNTER — Other Ambulatory Visit: Payer: Self-pay | Admitting: Cardiology

## 2017-08-12 DIAGNOSIS — R293 Abnormal posture: Secondary | ICD-10-CM | POA: Diagnosis not present

## 2017-08-12 DIAGNOSIS — M2560 Stiffness of unspecified joint, not elsewhere classified: Secondary | ICD-10-CM | POA: Diagnosis not present

## 2017-08-12 DIAGNOSIS — M542 Cervicalgia: Secondary | ICD-10-CM | POA: Diagnosis not present

## 2017-08-13 DIAGNOSIS — R293 Abnormal posture: Secondary | ICD-10-CM | POA: Diagnosis not present

## 2017-08-13 DIAGNOSIS — M2560 Stiffness of unspecified joint, not elsewhere classified: Secondary | ICD-10-CM | POA: Diagnosis not present

## 2017-08-13 DIAGNOSIS — M542 Cervicalgia: Secondary | ICD-10-CM | POA: Diagnosis not present

## 2017-08-17 DIAGNOSIS — M6281 Muscle weakness (generalized): Secondary | ICD-10-CM | POA: Diagnosis not present

## 2017-08-17 DIAGNOSIS — M2560 Stiffness of unspecified joint, not elsewhere classified: Secondary | ICD-10-CM | POA: Diagnosis not present

## 2017-08-17 DIAGNOSIS — R293 Abnormal posture: Secondary | ICD-10-CM | POA: Diagnosis not present

## 2017-08-17 DIAGNOSIS — M542 Cervicalgia: Secondary | ICD-10-CM | POA: Diagnosis not present

## 2017-08-19 DIAGNOSIS — M2560 Stiffness of unspecified joint, not elsewhere classified: Secondary | ICD-10-CM | POA: Diagnosis not present

## 2017-08-19 DIAGNOSIS — R293 Abnormal posture: Secondary | ICD-10-CM | POA: Diagnosis not present

## 2017-08-19 DIAGNOSIS — M6281 Muscle weakness (generalized): Secondary | ICD-10-CM | POA: Diagnosis not present

## 2017-08-19 DIAGNOSIS — M542 Cervicalgia: Secondary | ICD-10-CM | POA: Diagnosis not present

## 2017-08-23 DIAGNOSIS — D692 Other nonthrombocytopenic purpura: Secondary | ICD-10-CM | POA: Diagnosis not present

## 2017-08-23 DIAGNOSIS — D1801 Hemangioma of skin and subcutaneous tissue: Secondary | ICD-10-CM | POA: Diagnosis not present

## 2017-08-23 DIAGNOSIS — L570 Actinic keratosis: Secondary | ICD-10-CM | POA: Diagnosis not present

## 2017-08-23 DIAGNOSIS — Z85828 Personal history of other malignant neoplasm of skin: Secondary | ICD-10-CM | POA: Diagnosis not present

## 2017-08-24 DIAGNOSIS — M6281 Muscle weakness (generalized): Secondary | ICD-10-CM | POA: Diagnosis not present

## 2017-08-24 DIAGNOSIS — M542 Cervicalgia: Secondary | ICD-10-CM | POA: Diagnosis not present

## 2017-08-24 DIAGNOSIS — M2560 Stiffness of unspecified joint, not elsewhere classified: Secondary | ICD-10-CM | POA: Diagnosis not present

## 2017-08-24 DIAGNOSIS — R293 Abnormal posture: Secondary | ICD-10-CM | POA: Diagnosis not present

## 2017-08-26 ENCOUNTER — Ambulatory Visit (INDEPENDENT_AMBULATORY_CARE_PROVIDER_SITE_OTHER): Payer: Medicare Other | Admitting: *Deleted

## 2017-08-26 DIAGNOSIS — M6281 Muscle weakness (generalized): Secondary | ICD-10-CM | POA: Diagnosis not present

## 2017-08-26 DIAGNOSIS — M542 Cervicalgia: Secondary | ICD-10-CM | POA: Diagnosis not present

## 2017-08-26 DIAGNOSIS — M2560 Stiffness of unspecified joint, not elsewhere classified: Secondary | ICD-10-CM | POA: Diagnosis not present

## 2017-08-26 DIAGNOSIS — I255 Ischemic cardiomyopathy: Secondary | ICD-10-CM

## 2017-08-26 DIAGNOSIS — R293 Abnormal posture: Secondary | ICD-10-CM | POA: Diagnosis not present

## 2017-08-26 DIAGNOSIS — I5022 Chronic systolic (congestive) heart failure: Secondary | ICD-10-CM

## 2017-08-26 NOTE — Progress Notes (Signed)
Remote ICD transmission.   

## 2017-08-27 LAB — CUP PACEART REMOTE DEVICE CHECK
Battery Remaining Longevity: 114 mo
Battery Voltage: 3.01 V
Brady Statistic RV Percent Paced: 0.06 %
HIGH POWER IMPEDANCE MEASURED VALUE: 46 Ohm
HighPow Impedance: 68 Ohm
Implantable Lead Model: 6947
Implantable Pulse Generator Implant Date: 20160602
Lead Channel Impedance Value: 475 Ohm
Lead Channel Impedance Value: 532 Ohm
Lead Channel Setting Pacing Amplitude: 2.5 V
Lead Channel Setting Pacing Pulse Width: 0.4 ms
Lead Channel Setting Sensing Sensitivity: 0.3 mV
MDC IDC LEAD IMPLANT DT: 20070814
MDC IDC LEAD LOCATION: 753860
MDC IDC MSMT LEADCHNL RV PACING THRESHOLD AMPLITUDE: 0.875 V
MDC IDC MSMT LEADCHNL RV PACING THRESHOLD PULSEWIDTH: 0.4 ms
MDC IDC MSMT LEADCHNL RV SENSING INTR AMPL: 26.375 mV
MDC IDC MSMT LEADCHNL RV SENSING INTR AMPL: 26.375 mV
MDC IDC SESS DTM: 20181004062703

## 2017-08-31 DIAGNOSIS — R293 Abnormal posture: Secondary | ICD-10-CM | POA: Diagnosis not present

## 2017-08-31 DIAGNOSIS — M2560 Stiffness of unspecified joint, not elsewhere classified: Secondary | ICD-10-CM | POA: Diagnosis not present

## 2017-08-31 DIAGNOSIS — M6281 Muscle weakness (generalized): Secondary | ICD-10-CM | POA: Diagnosis not present

## 2017-08-31 DIAGNOSIS — M542 Cervicalgia: Secondary | ICD-10-CM | POA: Diagnosis not present

## 2017-09-02 ENCOUNTER — Encounter: Payer: Self-pay | Admitting: Cardiology

## 2017-09-09 DIAGNOSIS — H6122 Impacted cerumen, left ear: Secondary | ICD-10-CM | POA: Diagnosis not present

## 2017-10-06 NOTE — Progress Notes (Signed)
HPI: FU coronary artery disease. Abdominal CT in 2005 showed no aneurysm. Patient had a myocardial infarction in 2004. He had PCI of his LAD. His ejection fraction was 30%. Last catheterization in 2007 showed patent stents in the LAD. There is an 80% ostial first diagonal, occluded second diagonal and normal circumflex and right coronary artery. Ejection fraction was 30%. Patient had an ICD placed. Last echocardiogram in December 2012 showed an ejection fraction of 30-35%. There was grade 1 diastolic dysfunction. There was mild left atrial enlargement. Nuclear study May 2015 showed ejection fraction 34%. There was scar with mild peri-infarct ischemia. He has been treated medically. Since he was last seen, he occasionally has mild dyspnea not limiting. Occasional chest pain relieved with sublingual nitroglycerin which is unchanged in severity or frequency. He does not have chest pain that limits his activities. No syncope.  Current Outpatient Medications  Medication Sig Dispense Refill  . aspirin 81 MG EC tablet Take 81 mg by mouth daily.      Marland Kitchen atorvastatin (LIPITOR) 40 MG tablet take 1 tablet by mouth once daily 90 tablet 0  . calcium carbonate (OS-CAL) 600 MG TABS Take 600 mg by mouth daily.     . carvedilol (COREG) 6.25 MG tablet take 1 tablet by mouth twice a day with meals 180 tablet 3  . cholecalciferol (VITAMIN D) 1000 UNITS tablet Take 1,000 Units by mouth daily.     Marland Kitchen levothyroxine (SYNTHROID, LEVOTHROID) 50 MCG tablet Take 1 tablet (50 mcg total) by mouth every morning. 90 tablet 3  . nitroGLYCERIN (NITROSTAT) 0.4 MG SL tablet Place 1 tablet (0.4 mg total) under the tongue every 5 (five) minutes as needed. For chest pain 25 tablet 6  . omeprazole (PRILOSEC) 20 MG capsule Take 20 mg by mouth daily as needed (heartburn).   0  . ramipril (ALTACE) 5 MG capsule Take 5 mg by mouth daily.    Marland Kitchen spironolactone (ALDACTONE) 25 MG tablet Take 0.5 tablets (12.5 mg total) by mouth daily. 45 tablet  3   No current facility-administered medications for this visit.      Past Medical History:  Diagnosis Date  . Anxiety   . Atrial fibrillation ([place])   . Cardiac defibrillator in situ    s/p MDT Maximo VR single lead ICD - 07/06/2006  . 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  . Degenerative joint disease   . Depression   . H/O hiatal hernia    times 3  . History of orthostatic hypotension   . Hyperlipidemia   . Hypertension   . Hypothyroidism   . Ischemic cardiomyopathy    EF 40% 12/2008 V gram;  Echocardiogram 11/08/11: EF 30-35%, inferoseptal and anteroseptal, apical septal/lateral/anterior/inferior and true apical akinesis, no LV thrombus, grade 1 diastolic dysfunction.  . Lumbar spondylosis    s/p L5-S1 fusion 12/11/2009  . Premature atrial contractions   . Premature ventricular contractions   . Skin cancer    skin  . Systolic CHF, chronic ([place])     Past Surgical History:  Procedure Laterality Date  . BACK SURGERY     x 2  . EP IMPLANTABLE DEVICE [name] 04/25/2015  Procedure: ICD Generator Changeout;  Surgeon: Evans Lance, MD;  Location: [facility];  Service: Cardiovascular;  Laterality: [name];  . icd     s/p MDT Maximo VR single lead ICD - 07/06/2006  . INGUINAL HERNIA REPAIR Left    x 3  . LEFT HEART CATHETERIZATION WITH CORONARY ANGIOGRAM [name] 11/09/2011   Procedure: LEFT HEART CATHETERIZATION WITH CORONARY ANGIOGRAM;  Surgeon: Josue Hector, MD;  Location: [facility];  Service: Cardiovascular;  Laterality: [name];  . LUMBAR FUSION     s/p L5-S1 fusion 12/11/2009  . MOHS SURGERY     x 3, head nose  . SKIN CANCER EXCISION      Social History   Socioeconomic History  .  Marital status: Married    Spouse name: Not on file  . Number of children: 2  . Years of education: Not on file  . Highest education level: Not on file  Social Needs  . Financial resource strain: Not on file  . Food insecurity - worry: Not on file  . Food insecurity - inability: Not on file  . Transportation needs - medical: Not on file  . Transportation needs - non-medical: Not on file  Occupational History  . Occupation: retired    Fish farm manager: [name]  Tobacco Use  . Smoking status: Never Smoker  . Smokeless tobacco: Never Used  Substance and Sexual Activity  . Alcohol use: No  . Drug use: No  . Sexual activity: No  Other Topics Concern  . Not on file  Social History Narrative   Active, walks regularly.    Family History  Problem Relation Age of Onset  . Peripheral vascular disease Father        deceased 99  . Pneumonia Mother        deceased 17  . Prostate cancer Brother   . Diabetes Brother   . Heart disease Brother   . Cancer Brother        unknown type    ROS:  Patient fell 5 days ago injuring his right elbow and has residual pain but no fevers or chills, productive cough, hemoptysis, dysphasia, odynophagia, melena, hematochezia, dysuria, hematuria, rash, seizure activity, orthopnea, PND, pedal edema, claudication. Remaining systems are negative.  Physical Exam: Well-developed well-nourished in no acute distress.  Skin is warm and dry.  HEENT is normal.  Neck is supple.  Chest is clear to auscultation with normal expansion.  Cardiovascular exam is regular rate and rhythm.  Abdominal exam nontender or distended. No masses palpated. Extremities show no edema. There is abrasion over right elbow. neuro grossly intact   A/P  1 coronary artery disease-patientcontinues to do well with no chest pain or dyspnea. Continue aspirin and statin. We are treating medically.  2 ischemic cardiomyopathy-continue carvedilol and ramipril.  3 chronic systolic  congestive heart failure-patient appears to be doing well from a volume standpoint. Continue present dose of diuretics. We discussed the importance of fluid restriction and low sodium diet.  4 hypertension-blood pressure is controlled. Continue present medications.  5 hyperlipidemia-continue statin. Laboratories performed 10/18/2017 personally reviewed and showed normal liver functions and LDL of 45.  6 prior ICD-this is managed by Dr. Lovena Le.  Kirk Ruths, MD

## 2017-10-18 ENCOUNTER — Other Ambulatory Visit: Payer: Medicare Other | Admitting: Internal Medicine

## 2017-10-18 ENCOUNTER — Other Ambulatory Visit (INDEPENDENT_AMBULATORY_CARE_PROVIDER_SITE_OTHER): Payer: Medicare Other | Admitting: Internal Medicine

## 2017-10-18 DIAGNOSIS — Z79899 Other long term (current) drug therapy: Secondary | ICD-10-CM | POA: Diagnosis not present

## 2017-10-18 DIAGNOSIS — E785 Hyperlipidemia, unspecified: Secondary | ICD-10-CM

## 2017-10-18 DIAGNOSIS — E039 Hypothyroidism, unspecified: Secondary | ICD-10-CM | POA: Diagnosis not present

## 2017-10-19 ENCOUNTER — Encounter: Payer: Self-pay | Admitting: Cardiology

## 2017-10-19 ENCOUNTER — Ambulatory Visit (INDEPENDENT_AMBULATORY_CARE_PROVIDER_SITE_OTHER): Payer: Medicare Other | Admitting: Cardiology

## 2017-10-19 VITALS — BP 126/70 | HR 58 | Ht 68.0 in | Wt 150.0 lb

## 2017-10-19 DIAGNOSIS — I255 Ischemic cardiomyopathy: Secondary | ICD-10-CM

## 2017-10-19 DIAGNOSIS — I2510 Atherosclerotic heart disease of native coronary artery without angina pectoris: Secondary | ICD-10-CM | POA: Diagnosis not present

## 2017-10-19 DIAGNOSIS — E7800 Pure hypercholesterolemia, unspecified: Secondary | ICD-10-CM | POA: Diagnosis not present

## 2017-10-19 DIAGNOSIS — I10 Essential (primary) hypertension: Secondary | ICD-10-CM

## 2017-10-19 DIAGNOSIS — I259 Chronic ischemic heart disease, unspecified: Secondary | ICD-10-CM | POA: Diagnosis not present

## 2017-10-19 DIAGNOSIS — Z95810 Presence of automatic (implantable) cardiac defibrillator: Secondary | ICD-10-CM | POA: Diagnosis not present

## 2017-10-19 DIAGNOSIS — I5022 Chronic systolic (congestive) heart failure: Secondary | ICD-10-CM

## 2017-10-19 LAB — HEPATIC FUNCTION PANEL
AG Ratio: 1.8 (calc) (ref 1.0–2.5)
ALBUMIN MSPROF: 4.1 g/dL (ref 3.6–5.1)
ALT: 16 U/L (ref 9–46)
AST: 20 U/L (ref 10–35)
Alkaline phosphatase (APISO): 76 U/L (ref 40–115)
BILIRUBIN TOTAL: 0.9 mg/dL (ref 0.2–1.2)
Bilirubin, Direct: 0.2 mg/dL (ref 0.0–0.2)
Globulin: 2.3 g/dL (calc) (ref 1.9–3.7)
Indirect Bilirubin: 0.7 mg/dL (calc) (ref 0.2–1.2)
TOTAL PROTEIN: 6.4 g/dL (ref 6.1–8.1)

## 2017-10-19 LAB — TSH: TSH: 1.52 m[IU]/L (ref 0.40–4.50)

## 2017-10-19 LAB — LIPID PANEL
CHOLESTEROL: 145 mg/dL (ref <200)
HDL: 87 mg/dL (ref >40)
LDL CHOLESTEROL (CALC): 45 mg/dL
Non-HDL Cholesterol (Calc): 58 mg/dL (calc) (ref <130)
TRIGLYCERIDES: 52 mg/dL (ref <150)
Total CHOL/HDL Ratio: 1.7 (calc) (ref <5.0)

## 2017-10-19 MED ORDER — NITROGLYCERIN 0.4 MG SL SUBL: 0.4000 mg | SUBLINGUAL_TABLET | SUBLINGUAL | 6 refills | Status: DC | PRN

## 2017-10-19 NOTE — Patient Instructions (Signed)
Your physician wants you to follow-up in: 6 MONTHS WITH DR CRENSHAW You will receive a reminder letter in the mail two months in advance. If you don't receive a letter, please call our office to schedule the follow-up appointment.   If you need a refill on your cardiac medications before your next appointment, please call your pharmacy.  

## 2017-10-21 ENCOUNTER — Other Ambulatory Visit: Payer: Medicare Other | Admitting: Internal Medicine

## 2017-10-22 ENCOUNTER — Encounter: Payer: Self-pay | Admitting: Internal Medicine

## 2017-10-22 ENCOUNTER — Ambulatory Visit (INDEPENDENT_AMBULATORY_CARE_PROVIDER_SITE_OTHER): Payer: Medicare Other | Admitting: Internal Medicine

## 2017-10-22 VITALS — BP 160/80 | HR 78 | Wt 152.0 lb

## 2017-10-22 DIAGNOSIS — K219 Gastro-esophageal reflux disease without esophagitis: Secondary | ICD-10-CM

## 2017-10-22 DIAGNOSIS — I5022 Chronic systolic (congestive) heart failure: Secondary | ICD-10-CM | POA: Diagnosis not present

## 2017-10-22 DIAGNOSIS — Z95810 Presence of automatic (implantable) cardiac defibrillator: Secondary | ICD-10-CM | POA: Diagnosis not present

## 2017-10-22 DIAGNOSIS — I10 Essential (primary) hypertension: Secondary | ICD-10-CM | POA: Diagnosis not present

## 2017-10-22 DIAGNOSIS — S40811A Abrasion of right upper arm, initial encounter: Secondary | ICD-10-CM | POA: Diagnosis not present

## 2017-10-22 DIAGNOSIS — I255 Ischemic cardiomyopathy: Secondary | ICD-10-CM

## 2017-10-22 DIAGNOSIS — I259 Chronic ischemic heart disease, unspecified: Secondary | ICD-10-CM | POA: Diagnosis not present

## 2017-10-22 DIAGNOSIS — R7302 Impaired glucose tolerance (oral): Secondary | ICD-10-CM

## 2017-10-22 DIAGNOSIS — E7849 Other hyperlipidemia: Secondary | ICD-10-CM | POA: Diagnosis not present

## 2017-10-22 DIAGNOSIS — Z23 Encounter for immunization: Secondary | ICD-10-CM | POA: Diagnosis not present

## 2017-10-22 DIAGNOSIS — I5189 Other ill-defined heart diseases: Secondary | ICD-10-CM

## 2017-10-22 DIAGNOSIS — I252 Old myocardial infarction: Secondary | ICD-10-CM

## 2017-10-22 DIAGNOSIS — I519 Heart disease, unspecified: Secondary | ICD-10-CM

## 2017-10-22 MED ORDER — MUPIROCIN 2 % EX OINT: TOPICAL_OINTMENT | CUTANEOUS | 0 refills | Status: DC

## 2017-10-22 MED ORDER — LEVOTHYROXINE SODIUM 50 MCG PO TABS: 50.0000 ug | ORAL_TABLET | ORAL | 3 refills | Status: DC

## 2017-10-22 NOTE — Progress Notes (Signed)
   Subjective:    Patient ID: Corey Jordan, male    DOB: 04/16/1934, 83 y.o.   MRN: [id number]  HPI Here for 6 month recheck. Flu vaccine given.  He has a history of coronary artery disease followed by Dr. Stanford Breed.  No recent history of chest pain.  He has internal cardiac defibrillator.  History of ischemic cardiomyopathy with ejection fraction of 30% on cardiac cath in 2007.  History of class II congestive heart failure.  He had MI in 2004.  History of hypothyroidism hypertension hyperlipidemia and GE reflux  Recently he had a fall at his home when he was trying to break up some large twigs.  He lost his balance and suffered long linear abrasion posterior right upper arm  Lipid panel , liver panel, TSH are WNL.  Sometimes when he is shaving he feels a sharp pain at the base of his left lower neck.  I think is probably a muscle spasm.  It is self-limited.  He is chronically anxious  Review of Systems see above     Objective:   Physical Exam Neck is supple without JVD thyromegaly or carotid bruits.  Chest clear.  Cardiac exam regular rate and rhythm.  Linear abrasion right posterior upper arm for which Bactroban has been prescribed.  His tetanus immunization is up-to-date.  No evidence of secondary infection       Assessment & Plan:  Abrasion right upper arm-tetanus immunization up-to-date.  Apply Bactroban twice daily  Coronary artery disease followed by cardiologist  Hypothyroidism-TSH is stable  Hyperlipidemia  Cardiac defibrillator in place  Essential hypertension  Plan: Return in 6 months for physical exam.  Flu vaccine given.

## 2017-10-22 NOTE — Patient Instructions (Signed)
Flu vaccine given.  Bactroban to right upper arm abrasion.  Continue same medications and return in 6 months for physical exam.

## 2017-11-25 ENCOUNTER — Ambulatory Visit (INDEPENDENT_AMBULATORY_CARE_PROVIDER_SITE_OTHER): Payer: Medicare Other | Admitting: *Deleted

## 2017-11-25 DIAGNOSIS — I255 Ischemic cardiomyopathy: Secondary | ICD-10-CM | POA: Diagnosis not present

## 2017-11-25 DIAGNOSIS — I5022 Chronic systolic (congestive) heart failure: Secondary | ICD-10-CM

## 2017-11-26 ENCOUNTER — Encounter: Payer: Self-pay | Admitting: Cardiology

## 2017-11-26 NOTE — Progress Notes (Signed)
Remote ICD transmission.   

## 2017-11-27 ENCOUNTER — Other Ambulatory Visit: Payer: Self-pay | Admitting: Cardiology

## 2017-12-02 LAB — CUP PACEART REMOTE DEVICE CHECK
Brady Statistic RV Percent Paced: 0.08 %
Date Time Interrogation Session: 20190103051603
HIGH POWER IMPEDANCE MEASURED VALUE: 42 Ohm
HIGH POWER IMPEDANCE MEASURED VALUE: 61 Ohm
Implantable Lead Implant Date: 20070814
Implantable Lead Model: 6947
Lead Channel Impedance Value: 456 Ohm
Lead Channel Pacing Threshold Amplitude: 0.875 V
Lead Channel Pacing Threshold Pulse Width: 0.4 ms
Lead Channel Sensing Intrinsic Amplitude: 23.75 mV
Lead Channel Sensing Intrinsic Amplitude: 23.75 mV
MDC IDC LEAD LOCATION: 753860
MDC IDC MSMT BATTERY REMAINING LONGEVITY: 110 mo
MDC IDC MSMT BATTERY VOLTAGE: 3.01 V
MDC IDC MSMT LEADCHNL RV IMPEDANCE VALUE: 513 Ohm
MDC IDC PG IMPLANT DT: 20160602
MDC IDC SET LEADCHNL RV PACING AMPLITUDE: 2.5 V
MDC IDC SET LEADCHNL RV PACING PULSEWIDTH: 0.4 ms
MDC IDC SET LEADCHNL RV SENSING SENSITIVITY: 0.3 mV

## 2017-12-07 ENCOUNTER — Ambulatory Visit (INDEPENDENT_AMBULATORY_CARE_PROVIDER_SITE_OTHER): Payer: Medicare Other | Admitting: Internal Medicine

## 2017-12-07 ENCOUNTER — Encounter: Payer: Self-pay | Admitting: Internal Medicine

## 2017-12-07 VITALS — Ht 68.0 in

## 2017-12-07 DIAGNOSIS — I255 Ischemic cardiomyopathy: Secondary | ICD-10-CM | POA: Diagnosis not present

## 2017-12-07 DIAGNOSIS — M542 Cervicalgia: Secondary | ICD-10-CM | POA: Diagnosis not present

## 2017-12-07 MED ORDER — HYDROCODONE-ACETAMINOPHEN 10-325 MG PO TABS: 1.0000 | ORAL_TABLET | Freq: Two times a day (BID) | ORAL | 0 refills | Status: DC | PRN

## 2017-12-07 MED ORDER — PREDNISONE 10 MG PO TABS: ORAL_TABLET | ORAL | 0 refills | Status: DC

## 2017-12-07 MED ORDER — METHYLPREDNISOLONE ACETATE 80 MG/ML IJ SUSP
80.0000 mg | Freq: Once | INTRAMUSCULAR | Status: AC
  Administered 2017-12-07: 80 mg via INTRAMUSCULAR

## 2017-12-07 NOTE — Patient Instructions (Addendum)
Injected depomedrol maracaine and xylocaine into trigger point. Take prednisone in tapering course and take Norco 5/325 as needed up to 3 times a day.  Apply ice to neck 20 minutes once or twice daily.  Call if not better in 10 days to 2 weeks.
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
## 2017-12-08 ENCOUNTER — Telehealth: Payer: Self-pay | Admitting: Internal Medicine

## 2017-12-08 ENCOUNTER — Encounter: Payer: Self-pay | Admitting: Internal Medicine

## 2017-12-08 NOTE — Telephone Encounter (Signed)
He came back to the office this morning and brought prescription for hydrocodone APAP back to destroyed.  He says in a written note that he does not not want to take this medication and that the trigger point injection given yesterday  in left neck has helped.

## 2017-12-09 DIAGNOSIS — H6122 Impacted cerumen, left ear: Secondary | ICD-10-CM | POA: Diagnosis not present

## 2017-12-09 DIAGNOSIS — H6063 Unspecified chronic otitis externa, bilateral: Secondary | ICD-10-CM | POA: Diagnosis not present

## 2017-12-21 ENCOUNTER — Telehealth: Payer: Self-pay

## 2017-12-21 DIAGNOSIS — M542 Cervicalgia: Secondary | ICD-10-CM

## 2017-12-21 NOTE — Telephone Encounter (Signed)
Patient called to update you on his neck pain. He said the pain went away for about a week after the shot but this week he feels like the pain is starting to come back. He would like what to do?

## 2017-12-21 NOTE — Telephone Encounter (Signed)
CT order has been put in.

## 2017-12-21 NOTE — Telephone Encounter (Signed)
He needs neck CT with and without contrast. He cannot have MRI because of pacemaker/defibrilator

## 2017-12-23 NOTE — Telephone Encounter (Signed)
Spoke with [place] at [facility] @ [phone]; no authorization require for op procedures.  Everything is based on medical necessity.  Called patient to let him know.  Provided phone # to [place] and asked patient to call to schedule the CT Scan as they will have questions to ask of the patient.

## 2018-01-03 ENCOUNTER — Ambulatory Visit
Admission: RE | Admit: 2018-01-03 | Discharge: 2018-01-03 | Disposition: A | Discharge status: Home / Self Care | Payer: Medicare Other | Source: Ambulatory Visit | Attending: Internal Medicine | Admitting: Internal Medicine

## 2018-01-03 DIAGNOSIS — M542 Cervicalgia: Secondary | ICD-10-CM | POA: Diagnosis not present

## 2018-01-03 MED ORDER — IOPAMIDOL (ISOVUE-300) INJECTION 61%
75.0000 mL | Freq: Once | INTRAVENOUS | Status: AC | PRN
  Administered 2018-01-03: 75 mL via INTRAVENOUS

## 2018-01-04 ENCOUNTER — Other Ambulatory Visit: Payer: Self-pay | Admitting: Internal Medicine

## 2018-01-04 MED ORDER — CARVEDILOL 6.25 MG PO TABS: 6.2500 mg | ORAL_TABLET | Freq: Two times a day (BID) | ORAL | 1 refills | Status: DC

## 2018-01-13 DIAGNOSIS — M4802 Spinal stenosis, cervical region: Secondary | ICD-10-CM | POA: Diagnosis not present

## 2018-01-13 DIAGNOSIS — I10 Essential (primary) hypertension: Secondary | ICD-10-CM | POA: Diagnosis not present

## 2018-01-13 DIAGNOSIS — M438X9 Other specified deforming dorsopathies, site unspecified: Secondary | ICD-10-CM | POA: Diagnosis not present

## 2018-01-13 DIAGNOSIS — M542 Cervicalgia: Secondary | ICD-10-CM | POA: Diagnosis not present

## 2018-01-13 DIAGNOSIS — M4004 Postural kyphosis, thoracic region: Secondary | ICD-10-CM | POA: Diagnosis not present

## 2018-02-10 DIAGNOSIS — M542 Cervicalgia: Secondary | ICD-10-CM | POA: Diagnosis not present

## 2018-02-10 DIAGNOSIS — M4004 Postural kyphosis, thoracic region: Secondary | ICD-10-CM | POA: Diagnosis not present

## 2018-02-10 DIAGNOSIS — I10 Essential (primary) hypertension: Secondary | ICD-10-CM | POA: Diagnosis not present

## 2018-02-10 DIAGNOSIS — M4802 Spinal stenosis, cervical region: Secondary | ICD-10-CM | POA: Diagnosis not present

## 2018-02-10 DIAGNOSIS — M438X9 Other specified deforming dorsopathies, site unspecified: Secondary | ICD-10-CM | POA: Diagnosis not present

## 2018-02-12 ENCOUNTER — Other Ambulatory Visit: Payer: Self-pay | Admitting: Cardiology

## 2018-02-14 DIAGNOSIS — H25013 Cortical age-related cataract, bilateral: Secondary | ICD-10-CM | POA: Diagnosis not present

## 2018-02-14 DIAGNOSIS — H5203 Hypermetropia, bilateral: Secondary | ICD-10-CM | POA: Diagnosis not present

## 2018-02-15 DIAGNOSIS — M542 Cervicalgia: Secondary | ICD-10-CM | POA: Diagnosis not present

## 2018-02-18 DIAGNOSIS — M542 Cervicalgia: Secondary | ICD-10-CM | POA: Diagnosis not present

## 2018-02-21 ENCOUNTER — Encounter: Payer: Self-pay | Admitting: Internal Medicine

## 2018-02-21 DIAGNOSIS — D1801 Hemangioma of skin and subcutaneous tissue: Secondary | ICD-10-CM | POA: Diagnosis not present

## 2018-02-21 DIAGNOSIS — L720 Epidermal cyst: Secondary | ICD-10-CM | POA: Diagnosis not present

## 2018-02-21 DIAGNOSIS — L821 Other seborrheic keratosis: Secondary | ICD-10-CM | POA: Diagnosis not present

## 2018-02-21 DIAGNOSIS — Z85828 Personal history of other malignant neoplasm of skin: Secondary | ICD-10-CM | POA: Diagnosis not present

## 2018-02-21 DIAGNOSIS — L570 Actinic keratosis: Secondary | ICD-10-CM | POA: Diagnosis not present

## 2018-02-21 DIAGNOSIS — D485 Neoplasm of uncertain behavior of skin: Secondary | ICD-10-CM | POA: Diagnosis not present

## 2018-02-21 DIAGNOSIS — C44622 Squamous cell carcinoma of skin of right upper limb, including shoulder: Secondary | ICD-10-CM | POA: Diagnosis not present

## 2018-02-24 ENCOUNTER — Ambulatory Visit (INDEPENDENT_AMBULATORY_CARE_PROVIDER_SITE_OTHER): Payer: Medicare Other | Admitting: *Deleted

## 2018-02-24 DIAGNOSIS — I255 Ischemic cardiomyopathy: Secondary | ICD-10-CM | POA: Diagnosis not present

## 2018-02-24 DIAGNOSIS — I5022 Chronic systolic (congestive) heart failure: Secondary | ICD-10-CM

## 2018-02-24 NOTE — Progress Notes (Signed)
Remote ICD transmission.   

## 2018-03-01 ENCOUNTER — Other Ambulatory Visit: Payer: Self-pay | Admitting: Cardiology

## 2018-03-01 NOTE — Telephone Encounter (Signed)
REFILL 

## 2018-03-02 ENCOUNTER — Encounter: Payer: Self-pay | Admitting: Cardiology

## 2018-03-08 LAB — CUP PACEART REMOTE DEVICE CHECK
Battery Voltage: 3.01 V
Brady Statistic RV Percent Paced: 0.07 %
HIGH POWER IMPEDANCE MEASURED VALUE: 45 Ohm
HIGH POWER IMPEDANCE MEASURED VALUE: 63 Ohm
Implantable Lead Implant Date: 20070814
Implantable Lead Model: 6947
Lead Channel Impedance Value: 456 Ohm
Lead Channel Pacing Threshold Amplitude: 0.875 V
Lead Channel Pacing Threshold Pulse Width: 0.4 ms
Lead Channel Sensing Intrinsic Amplitude: 25.25 mV
Lead Channel Sensing Intrinsic Amplitude: 25.25 mV
Lead Channel Setting Pacing Pulse Width: 0.4 ms
Lead Channel Setting Sensing Sensitivity: 0.3 mV
MDC IDC LEAD LOCATION: 753860
MDC IDC MSMT BATTERY REMAINING LONGEVITY: 105 mo
MDC IDC MSMT LEADCHNL RV IMPEDANCE VALUE: 513 Ohm
MDC IDC PG IMPLANT DT: 20160602
MDC IDC SESS DTM: 20190404063324
MDC IDC SET LEADCHNL RV PACING AMPLITUDE: 2.5 V

## 2018-03-10 DIAGNOSIS — H6122 Impacted cerumen, left ear: Secondary | ICD-10-CM | POA: Diagnosis not present

## 2018-04-06 ENCOUNTER — Other Ambulatory Visit: Payer: Self-pay

## 2018-04-06 DIAGNOSIS — I5189 Other ill-defined heart diseases: Secondary | ICD-10-CM

## 2018-04-06 DIAGNOSIS — E7849 Other hyperlipidemia: Secondary | ICD-10-CM

## 2018-04-06 DIAGNOSIS — R7302 Impaired glucose tolerance (oral): Secondary | ICD-10-CM

## 2018-04-06 DIAGNOSIS — Z125 Encounter for screening for malignant neoplasm of prostate: Secondary | ICD-10-CM

## 2018-04-06 DIAGNOSIS — I5022 Chronic systolic (congestive) heart failure: Secondary | ICD-10-CM

## 2018-04-06 DIAGNOSIS — I255 Ischemic cardiomyopathy: Secondary | ICD-10-CM

## 2018-04-06 DIAGNOSIS — I10 Essential (primary) hypertension: Secondary | ICD-10-CM

## 2018-04-06 DIAGNOSIS — K219 Gastro-esophageal reflux disease without esophagitis: Secondary | ICD-10-CM

## 2018-04-06 DIAGNOSIS — Z0000 Encounter for general adult medical examination without abnormal findings: Secondary | ICD-10-CM

## 2018-04-06 DIAGNOSIS — I252 Old myocardial infarction: Secondary | ICD-10-CM

## 2018-04-22 ENCOUNTER — Other Ambulatory Visit: Payer: Medicare Other | Admitting: Internal Medicine

## 2018-04-22 DIAGNOSIS — Z0000 Encounter for general adult medical examination without abnormal findings: Secondary | ICD-10-CM | POA: Diagnosis not present

## 2018-04-22 DIAGNOSIS — I5189 Other ill-defined heart diseases: Secondary | ICD-10-CM | POA: Diagnosis not present

## 2018-04-22 DIAGNOSIS — I10 Essential (primary) hypertension: Secondary | ICD-10-CM | POA: Diagnosis not present

## 2018-04-22 DIAGNOSIS — E7849 Other hyperlipidemia: Secondary | ICD-10-CM | POA: Diagnosis not present

## 2018-04-22 DIAGNOSIS — K219 Gastro-esophageal reflux disease without esophagitis: Secondary | ICD-10-CM | POA: Diagnosis not present

## 2018-04-22 DIAGNOSIS — I5022 Chronic systolic (congestive) heart failure: Secondary | ICD-10-CM

## 2018-04-22 DIAGNOSIS — R7302 Impaired glucose tolerance (oral): Secondary | ICD-10-CM | POA: Diagnosis not present

## 2018-04-22 DIAGNOSIS — E039 Hypothyroidism, unspecified: Secondary | ICD-10-CM | POA: Diagnosis not present

## 2018-04-22 DIAGNOSIS — I255 Ischemic cardiomyopathy: Secondary | ICD-10-CM

## 2018-04-22 DIAGNOSIS — I252 Old myocardial infarction: Secondary | ICD-10-CM | POA: Diagnosis not present

## 2018-04-22 DIAGNOSIS — Z125 Encounter for screening for malignant neoplasm of prostate: Secondary | ICD-10-CM | POA: Diagnosis not present

## 2018-04-26 ENCOUNTER — Encounter: Payer: Self-pay | Admitting: Internal Medicine

## 2018-04-26 ENCOUNTER — Ambulatory Visit (INDEPENDENT_AMBULATORY_CARE_PROVIDER_SITE_OTHER): Payer: Medicare Other | Admitting: Internal Medicine

## 2018-04-26 VITALS — BP 170/90 | HR 72 | Temp 98.0°F | Ht 67.0 in | Wt 148.0 lb

## 2018-04-26 DIAGNOSIS — E039 Hypothyroidism, unspecified: Secondary | ICD-10-CM | POA: Diagnosis not present

## 2018-04-26 DIAGNOSIS — Z0000 Encounter for general adult medical examination without abnormal findings: Secondary | ICD-10-CM

## 2018-04-26 DIAGNOSIS — K219 Gastro-esophageal reflux disease without esophagitis: Secondary | ICD-10-CM | POA: Diagnosis not present

## 2018-04-26 DIAGNOSIS — M40204 Unspecified kyphosis, thoracic region: Secondary | ICD-10-CM

## 2018-04-26 DIAGNOSIS — I255 Ischemic cardiomyopathy: Secondary | ICD-10-CM

## 2018-04-26 DIAGNOSIS — Z95810 Presence of automatic (implantable) cardiac defibrillator: Secondary | ICD-10-CM

## 2018-04-26 DIAGNOSIS — E7849 Other hyperlipidemia: Secondary | ICD-10-CM

## 2018-04-26 DIAGNOSIS — I10 Essential (primary) hypertension: Secondary | ICD-10-CM | POA: Diagnosis not present

## 2018-04-26 DIAGNOSIS — R609 Edema, unspecified: Secondary | ICD-10-CM | POA: Diagnosis not present

## 2018-04-26 DIAGNOSIS — I252 Old myocardial infarction: Secondary | ICD-10-CM

## 2018-04-26 DIAGNOSIS — R7302 Impaired glucose tolerance (oral): Secondary | ICD-10-CM

## 2018-04-26 DIAGNOSIS — I259 Chronic ischemic heart disease, unspecified: Secondary | ICD-10-CM

## 2018-04-26 DIAGNOSIS — I5189 Other ill-defined heart diseases: Secondary | ICD-10-CM

## 2018-04-26 LAB — COMPLETE METABOLIC PANEL WITH GFR
AG Ratio: 2 (calc) (ref 1.0–2.5)
ALKALINE PHOSPHATASE (APISO): 73 U/L (ref 40–115)
ALT: 12 U/L (ref 9–46)
AST: 18 U/L (ref 10–35)
Albumin: 4.1 g/dL (ref 3.6–5.1)
BUN: 16 mg/dL (ref 7–25)
CALCIUM: 10.3 mg/dL (ref 8.6–10.3)
CO2: 31 mmol/L (ref 20–32)
CREATININE: 1.08 mg/dL (ref 0.70–1.11)
Chloride: 105 mmol/L (ref 98–110)
GFR, EST NON AFRICAN AMERICAN: 64 mL/min/{1.73_m2} (ref >=60)
GFR, Est African American: 74 mL/min/{1.73_m2} (ref >=60)
GLUCOSE: 89 mg/dL (ref 65–99)
Globulin: 2.1 g/dL (calc) (ref 1.9–3.7)
Potassium: 4.6 mmol/L (ref 3.5–5.3)
SODIUM: 143 mmol/L (ref 135–146)
Total Bilirubin: 0.8 mg/dL (ref 0.2–1.2)
Total Protein: 6.2 g/dL (ref 6.1–8.1)

## 2018-04-26 LAB — CBC WITH DIFFERENTIAL/PLATELET
Basophils Absolute: 19 cells/uL (ref 0–200)
Basophils Relative: 0.4 %
EOS ABS: 101 {cells}/uL (ref 15–500)
Eosinophils Relative: 2.1 %
HEMATOCRIT: 38.5 % (ref 38.5–50.0)
HEMOGLOBIN: 12.7 g/dL — AB (ref 13.2–17.1)
LYMPHS ABS: 797 {cells}/uL — AB (ref 850–3900)
MCH: 29.5 pg (ref 27.0–33.0)
MCHC: 33 g/dL (ref 32.0–36.0)
MCV: 89.3 fL (ref 80.0–100.0)
MPV: 10.8 fL (ref 7.5–12.5)
Monocytes Relative: 11.2 %
NEUTROS ABS: 3346 {cells}/uL (ref 1500–7800)
Neutrophils Relative %: 69.7 %
PLATELETS: 198 10*3/uL (ref 140–400)
RBC: 4.31 10*6/uL (ref 4.20–5.80)
RDW: 12.4 % (ref 11.0–15.0)
Total Lymphocyte: 16.6 %
WBC mixed population: 538 cells/uL (ref 200–950)
WBC: 4.8 10*3/uL (ref 3.8–10.8)

## 2018-04-26 LAB — HEMOGLOBIN A1C
HEMOGLOBIN A1C: 5.7 %{Hb} — AB (ref <5.7)
Mean Plasma Glucose: 117 (calc)
eAG (mmol/L): 6.5 (calc)

## 2018-04-26 LAB — POCT URINALYSIS DIPSTICK
APPEARANCE: NORMAL
Bilirubin, UA: NEGATIVE
Glucose, UA: NEGATIVE
Ketones, UA: NEGATIVE
LEUKOCYTES UA: NEGATIVE
NITRITE UA: NEGATIVE
Odor: NORMAL
PH UA: 6.5 (ref 5.0–8.0)
Protein, UA: NEGATIVE
Spec Grav, UA: 1.02 (ref 1.010–1.025)
UROBILINOGEN UA: 0.2 U/dL

## 2018-04-26 LAB — LIPID PANEL
CHOL/HDL RATIO: 2.1 (calc) (ref <5.0)
CHOLESTEROL: 143 mg/dL (ref <200)
HDL: 68 mg/dL (ref >40)
LDL Cholesterol (Calc): 61 mg/dL (calc)
Non-HDL Cholesterol (Calc): 75 mg/dL (calc) (ref <130)
TRIGLYCERIDES: 48 mg/dL (ref <150)

## 2018-04-26 LAB — TSH: TSH: 1.93 m[IU]/L (ref 0.40–4.50)

## 2018-04-26 LAB — TEST AUTHORIZATION

## 2018-04-26 LAB — PSA: PSA: 3.6 ng/mL (ref <=4.0)

## 2018-04-26 MED ORDER — FUROSEMIDE 20 MG PO TABS: ORAL_TABLET | ORAL | 3 refills | Status: DC

## 2018-04-26 MED ORDER — OMEPRAZOLE 20 MG PO CPDR: 20.0000 mg | DELAYED_RELEASE_CAPSULE | Freq: Every day | ORAL | 2 refills | Status: DC | PRN
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
## 2018-04-30 ENCOUNTER — Other Ambulatory Visit: Payer: Self-pay | Admitting: Internal Medicine

## 2018-05-22 NOTE — Patient Instructions (Addendum)
Trial of Lasix 20 mg daily for dependent edema.  Keep feet elevated as much as possible.  Continue same medications and return in 6 months.

## 2018-05-27 ENCOUNTER — Ambulatory Visit (INDEPENDENT_AMBULATORY_CARE_PROVIDER_SITE_OTHER): Payer: Medicare Other | Admitting: *Deleted

## 2018-05-27 DIAGNOSIS — I5022 Chronic systolic (congestive) heart failure: Secondary | ICD-10-CM

## 2018-05-27 DIAGNOSIS — I255 Ischemic cardiomyopathy: Secondary | ICD-10-CM

## 2018-05-27 NOTE — Progress Notes (Signed)
Remote ICD transmission.   

## 2018-06-09 DIAGNOSIS — H6121 Impacted cerumen, right ear: Secondary | ICD-10-CM | POA: Diagnosis not present

## 2018-06-17 LAB — CUP PACEART REMOTE DEVICE CHECK
Battery Voltage: 3.01 V
Date Time Interrogation Session: 20190705073423
HIGH POWER IMPEDANCE MEASURED VALUE: 53 Ohm
HighPow Impedance: 38 Ohm
Implantable Lead Implant Date: 20070814
Implantable Lead Location: 753860
Implantable Lead Model: 6947
Implantable Pulse Generator Implant Date: 20160602
Lead Channel Impedance Value: 399 Ohm
Lead Channel Pacing Threshold Amplitude: 0.875 V
Lead Channel Pacing Threshold Pulse Width: 0.4 ms
Lead Channel Sensing Intrinsic Amplitude: 21.5 mV
Lead Channel Sensing Intrinsic Amplitude: 21.5 mV
MDC IDC MSMT BATTERY REMAINING LONGEVITY: 103 mo
MDC IDC MSMT LEADCHNL RV IMPEDANCE VALUE: 456 Ohm
MDC IDC SET LEADCHNL RV PACING AMPLITUDE: 2.5 V
MDC IDC SET LEADCHNL RV PACING PULSEWIDTH: 0.4 ms
MDC IDC SET LEADCHNL RV SENSING SENSITIVITY: 0.3 mV
MDC IDC STAT BRADY RV PERCENT PACED: 0.06 %

## 2018-06-17 NOTE — Progress Notes (Signed)
HPI: FU coronary artery disease. Abdominal CT in 2005 showed no aneurysm. Patient had a myocardial infarction in 2004. He had PCI of his LAD. His ejection fraction was 30%. Last catheterization in 2007 showed patent stents in the LAD. There is an 80% ostial first diagonal, occluded second diagonal and normal circumflex and right coronary artery. Ejection fraction was 30%. Patient had an ICD placed. Last echocardiogram in December 2012 showed an ejection fraction of 30-35%. There was grade 1 diastolic dysfunction. There was mild left atrial enlargement. Nuclear study May 2015 showed ejection fraction 34%. There was scar with mild peri-infarct ischemia. He has been treated medically. Since he was last seen,he has mild dyspnea on exertion unchanged.  No orthopnea, PND, pedal edema, chest pain or syncope.  Patient fell in January and has had difficulties with neck problems since then.  He is unable to lift his head.  Current Outpatient Medications  Medication Sig Dispense Refill  . aspirin 81 MG EC tablet Take 81 mg by mouth daily.      Marland Kitchen atorvastatin (LIPITOR) 40 MG tablet TAKE 1 TABLET BY MOUTH ONCE DAILY 90 tablet 1  . calcium carbonate (OS-CAL) 600 MG TABS Take 600 mg by mouth daily.     . carvedilol (COREG) 6.25 MG tablet Take 1 tablet (6.25 mg total) by mouth 2 (two) times daily with a meal. 180 tablet 1  . cholecalciferol (VITAMIN D) 1000 UNITS tablet Take 1,000 Units by mouth daily.     . furosemide (LASIX) 20 MG tablet One po every other day for dependent edema 30 tablet 3  . HYDROcodone-acetaminophen (NORCO) 10-325 MG tablet Take 1 tablet by mouth every 12 (twelve) hours as needed. 20 tablet 0  . levothyroxine (SYNTHROID, LEVOTHROID) 50 MCG tablet Take 1 tablet (50 mcg total) by mouth every morning. 90 tablet 3  . mupirocin ointment (BACTROBAN) 2 % Apply to abrasion twice a day 22 g 0  . nitroGLYCERIN (NITROSTAT) 0.4 MG SL tablet Place 1 tablet (0.4 mg total) under the tongue every 5  (five) minutes as needed. For chest pain 25 tablet 6  . omeprazole (PRILOSEC) 20 MG capsule Take 1 capsule (20 mg total) by mouth daily as needed (heartburn). 30 capsule 2  . ramipril (ALTACE) 5 MG capsule Take 5 mg by mouth daily.    Marland Kitchen spironolactone (ALDACTONE) 25 MG tablet Take 0.5 tablets (12.5 mg total) by mouth daily. Please keep upcoming appt in July for future refills. Thank you 45 tablet 0   No current facility-administered medications for this visit.      Past Medical History:  Diagnosis Date  . Anxiety   . Atrial fibrillation ([place])   . Cardiac defibrillator in situ    s/p MDT Maximo VR single lead ICD - 07/06/2006  . 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  . Degenerative joint disease   . Depression   . H/O hiatal hernia    times 3  . History of orthostatic hypotension   . Hyperlipidemia   . Hypertension   . Hypothyroidism   . Ischemic cardiomyopathy    EF 40% 12/2008 V gram;  Echocardiogram 11/08/11: EF 30-35%, inferoseptal and anteroseptal,  apical septal/lateral/anterior/inferior and true apical akinesis, no LV thrombus, grade 1 diastolic dysfunction.  . Lumbar spondylosis    s/p L5-S1 fusion 12/11/2009  . Premature atrial contractions   . Premature ventricular contractions   . Skin cancer    skin  . Systolic CHF, chronic ([place])     Past Surgical History:  Procedure Laterality Date  . BACK SURGERY     x 2  . EP IMPLANTABLE DEVICE [name] 04/25/2015   Procedure: ICD Generator Changeout;  Surgeon: Evans Lance, MD;  Location: [facility];  Service: Cardiovascular;  Laterality: [name];  . icd     s/p MDT Maximo VR single lead ICD - 07/06/2006  . INGUINAL HERNIA REPAIR Left    x  3  . LEFT HEART CATHETERIZATION WITH CORONARY ANGIOGRAM [name] 11/09/2011   Procedure: LEFT HEART CATHETERIZATION WITH CORONARY ANGIOGRAM;  Surgeon: Josue Hector, MD;  Location: [facility] C[facility];  Service: Cardiovascular;  Laterality: [name];  . LUMBAR FUSION     s/p L5-S1 fusion 12/11/2009  . MOHS SURGERY     x 3, head nose  . SKIN CANCER EXCISION      Social History   Socioeconomic History  . Marital status: Married    Spouse name: Not on file  . Number of children: 2  . Years of education: Not on file  . Highest education level: Not on file  Occupational History  . Occupation: retired    Fish farm manager: [name]  Social Needs  . Financial resource strain: Not on file  . Food insecurity:    Worry: Not on file    Inability: Not on file  . Transportation needs:    Medical: Not on file    Non-medical: Not on file  Tobacco Use  . Smoking status: Never Smoker  . Smokeless tobacco: Never Used  Substance and Sexual Activity  . Alcohol use: No  . Drug use: No  . Sexual activity: Never  Lifestyle  . Physical activity:    Days per week: Not on file    Minutes per session: Not on file  . Stress: Not on file  Relationships  . Social connections:    Talks on phone: Not on file    Gets together: Not on file    Attends religious service: Not on file    Active member of club or organization: Not on file    Attends meetings of clubs or organizations: Not on file    Relationship status: Not on file  . Intimate partner violence:    Fear of current or ex partner: Not on file    Emotionally abused: Not on file    Physically abused: Not on file    Forced sexual activity: Not on file  Other Topics Concern  . Not on file  Social History Narrative   Active, walks regularly.    Family History  Problem Relation Age of Onset  . Peripheral vascular disease Father        deceased 66  . Pneumonia Mother        deceased 36  . Prostate cancer Brother   . Diabetes Brother   . Heart disease  Brother   . Cancer Brother        unknown type    ROS: Neck pain but no fevers or chills, productive cough, hemoptysis, dysphasia, odynophagia, melena, hematochezia, dysuria, hematuria, rash, seizure activity, orthopnea, PND, pedal edema, claudication. Remaining systems are negative.  Physical Exam: Well-developed well-nourished elderly in  no acute distress.   Skin is warm and dry.  HEENT is normal.  Neck is supple.  Chest is clear to auscultation with normal expansion.  Cardiovascular exam is regular rate and rhythm.  Abdominal exam nontender or distended. No masses palpated. Extremities show no edema. neuro grossly intact  ECG-normal sinus rhythm at a rate of 65.  Anterior T wave inversion.  No change since January 06, 2017.  Personally reviewed  A/P  1 coronary artery disease-patient remains asymptomatic.  He denies chest pain or dyspnea.  Continue medical therapy including aspirin and statin.  2 chronic systolic congestive heart failure-he is euvolemic today on examination.  Continue present dose of diuretics.  We discussed the importance of fluid restriction and low-sodium diet.   3 ischemic cardiomyopathy-continue carvedilol and ramipril.  4 hypertension-blood pressure is controlled.  Continue present medications.  5 prior ICD-managed by Dr. Lovena Le.  6 hyperlipidemia-continue statin.    Kirk Ruths, MD

## 2018-06-21 ENCOUNTER — Encounter: Payer: Self-pay | Admitting: Cardiology

## 2018-06-21 ENCOUNTER — Ambulatory Visit (INDEPENDENT_AMBULATORY_CARE_PROVIDER_SITE_OTHER): Payer: Medicare Other | Admitting: Cardiology

## 2018-06-21 VITALS — BP 132/71 | HR 65 | Ht 67.5 in | Wt 141.8 lb

## 2018-06-21 DIAGNOSIS — I255 Ischemic cardiomyopathy: Secondary | ICD-10-CM

## 2018-06-21 DIAGNOSIS — I259 Chronic ischemic heart disease, unspecified: Secondary | ICD-10-CM

## 2018-06-21 DIAGNOSIS — E7800 Pure hypercholesterolemia, unspecified: Secondary | ICD-10-CM | POA: Diagnosis not present

## 2018-06-21 DIAGNOSIS — I10 Essential (primary) hypertension: Secondary | ICD-10-CM

## 2018-06-21 DIAGNOSIS — I2510 Atherosclerotic heart disease of native coronary artery without angina pectoris: Secondary | ICD-10-CM | POA: Diagnosis not present

## 2018-06-21 NOTE — Patient Instructions (Signed)
Your physician wants you to follow-up in: ONE YEAR WITH DR CRENSHAW You will receive a reminder letter in the mail two months in advance. If you don't receive a letter, please call our office to schedule the follow-up appointment.   If you need a refill on your cardiac medications before your next appointment, please call your pharmacy.  

## 2018-06-22 ENCOUNTER — Encounter: Payer: Self-pay | Admitting: Internal Medicine

## 2018-06-22 ENCOUNTER — Encounter (INDEPENDENT_AMBULATORY_CARE_PROVIDER_SITE_OTHER): Payer: Self-pay

## 2018-06-22 ENCOUNTER — Ambulatory Visit (INDEPENDENT_AMBULATORY_CARE_PROVIDER_SITE_OTHER): Payer: Medicare Other | Admitting: Internal Medicine

## 2018-06-22 VITALS — BP 140/80 | HR 89 | Ht 67.0 in | Wt 142.4 lb

## 2018-06-22 DIAGNOSIS — I255 Ischemic cardiomyopathy: Secondary | ICD-10-CM | POA: Diagnosis not present

## 2018-06-22 DIAGNOSIS — I259 Chronic ischemic heart disease, unspecified: Secondary | ICD-10-CM | POA: Diagnosis not present

## 2018-06-22 DIAGNOSIS — I2510 Atherosclerotic heart disease of native coronary artery without angina pectoris: Secondary | ICD-10-CM | POA: Diagnosis not present

## 2018-06-22 DIAGNOSIS — I5022 Chronic systolic (congestive) heart failure: Secondary | ICD-10-CM | POA: Diagnosis not present

## 2018-06-22 DIAGNOSIS — Z95810 Presence of automatic (implantable) cardiac defibrillator: Secondary | ICD-10-CM

## 2018-06-22 NOTE — Patient Instructions (Signed)
Medication Instructions:  Your physician recommends that you continue on your current medications as directed. Please refer to the Current Medication list given to you today.  Labwork: None ordered.  Testing/Procedures: None ordered.  Follow-Up: Your physician wants you to follow-up in: one year with Dr. Lovena Le.   You will receive a reminder letter in the mail two months in advance. If you don't receive a letter, please call our office to schedule the follow-up appointment.  Remote monitoring is used to monitor your ICD from home. This monitoring reduces the number of office visits required to check your device to one time per year. It allows [name] to keep an eye on the functioning of your device to ensure it is working properly. You are scheduled for a device check from home on 08/29/2018. You may send your transmission at any time that day. If you have a wireless device, the transmission will be sent automatically. After your physician reviews your transmission, you will receive a postcard with your next transmission date.  Any Other Special Instructions Will Be Listed Below (If Applicable).  If you need a refill on your cardiac medications before your next appointment, please call your pharmacy.

## 2018-06-22 NOTE — Progress Notes (Signed)
HPI Corey Jordan returns today for followup. He is a very pleasant 84 year old man with an ischemic cardiomyopathy, chronic systolic heart failure, VT, status post ICD implantation. He has undergone ICD system generator change with pocket relocation to a subpectoral location. He has no chest pain or sob. No ICD shock.  He denies dietary or medical indiscretion or noncompliance. In the interim, he has fallen and injured his neck which is now flexed forward making him constanly look at the ground. He saw Dr. Vertell Limber and was told that surgery was too risky.   Allergies  Allergen Reactions  . Pollen Extract Other (See Comments)    Sneezing and watery eyes     Current Outpatient Medications  Medication Sig Dispense Refill  . aspirin 81 MG EC tablet Take 81 mg by mouth daily.      Marland Kitchen atorvastatin (LIPITOR) 40 MG tablet TAKE 1 TABLET BY MOUTH ONCE DAILY 90 tablet 1  . calcium carbonate (OS-CAL) 600 MG TABS Take 600 mg by mouth daily.     . carvedilol (COREG) 6.25 MG tablet Take 1 tablet (6.25 mg total) by mouth 2 (two) times daily with a meal. 180 tablet 1  . cholecalciferol (VITAMIN D) 1000 UNITS tablet Take 1,000 Units by mouth daily.     . furosemide (LASIX) 20 MG tablet One po every other day for dependent edema 30 tablet 3  . levothyroxine (SYNTHROID, LEVOTHROID) 50 MCG tablet Take 1 tablet (50 mcg total) by mouth every morning. 90 tablet 3  . mupirocin ointment (BACTROBAN) 2 % Apply to abrasion twice a day 22 g 0  . nitroGLYCERIN (NITROSTAT) 0.4 MG SL tablet Place 1 tablet (0.4 mg total) under the tongue every 5 (five) minutes as needed. For chest pain 25 tablet 6  . omeprazole (PRILOSEC) 20 MG capsule Take 1 capsule (20 mg total) by mouth daily as needed (heartburn). 30 capsule 2  . ramipril (ALTACE) 5 MG capsule Take 5 mg by mouth daily.    Marland Kitchen spironolactone (ALDACTONE) 25 MG tablet Take 0.5 tablets (12.5 mg total) by mouth daily. Please keep upcoming appt in July for future refills.  Thank you 45 tablet 0   No current facility-administered medications for this visit.      Past Medical History:  Diagnosis Date  . Anxiety   . Atrial fibrillation ([place])   . Cardiac defibrillator in situ    s/p MDT Maximo VR single lead ICD - 07/06/2006  . 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  . Degenerative joint disease   . Depression   . H/O hiatal hernia    times 3  . History of orthostatic hypotension   . Hyperlipidemia   . Hypertension   . Hypothyroidism   . Ischemic cardiomyopathy    EF 40% 12/2008 V gram;  Echocardiogram 11/08/11: EF 30-35%, inferoseptal and anteroseptal, apical septal/lateral/anterior/inferior and true apical akinesis, no LV thrombus, grade 1 diastolic dysfunction.  . Lumbar spondylosis    s/p L5-S1 fusion 12/11/2009  . Premature atrial contractions   . Premature ventricular contractions   . Skin cancer    skin  . Systolic CHF, chronic ([facility])     ROS:  All systems reviewed and negative except as noted in the HPI.   Past Surgical History:  Procedure Laterality Date  . BACK SURGERY     x 2  . EP IMPLANTABLE DEVICE [name] 04/25/2015   Procedure: ICD Generator Changeout;  Surgeon: Evans Lance, MD;  Location: [place] [facility];  Service: Cardiovascular;  Laterality: [name];  . icd     s/p MDT Maximo VR single lead ICD - 07/06/2006  . INGUINAL HERNIA REPAIR Left    x 3  . LEFT HEART CATHETERIZATION WITH CORONARY ANGIOGRAM [name] 11/09/2011   Procedure: LEFT HEART CATHETERIZATION WITH CORONARY ANGIOGRAM;  Surgeon: Josue Hector, MD;  Location: [facility] CATH LAB;  Service: Cardiovascular;  Laterality: [name];  . LUMBAR FUSION     s/p L5-S1 fusion 12/11/2009  . MOHS SURGERY      x 3, head nose  . SKIN CANCER EXCISION       Family History  Problem Relation Age of Onset  . Peripheral vascular disease Father        deceased 51  . Pneumonia Mother        deceased 9  . Prostate cancer Brother   . Diabetes Brother   . Heart disease Brother   . Cancer Brother        unknown type     Social History   Socioeconomic History  . Marital status: Married    Spouse name: Not on file  . Number of children: 2  . Years of education: Not on file  . Highest education level: Not on file  Occupational History  . Occupation: retired    Fish farm manager: [name]  Social Needs  . Financial resource strain: Not on file  . Food insecurity:    Worry: Not on file    Inability: Not on file  . Transportation needs:    Medical: Not on file    Non-medical: Not on file  Tobacco Use  . Smoking status: Never Smoker  . Smokeless tobacco: Never Used  Substance and Sexual Activity  . Alcohol use: No  . Drug use: No  . Sexual activity: Never  Lifestyle  . Physical activity:    Days per week: Not on file    Minutes per session: Not on file  . Stress: Not on file  Relationships  . Social connections:    Talks on phone: Not on file    Gets together: Not on file    Attends religious service: Not on file    Active member of club or organization: Not on file    Attends meetings of clubs or organizations: Not on file    Relationship status: Not on file  . Intimate partner violence:    Fear of current or ex partner: Not on file    Emotionally abused: Not on file    Physically abused: Not on file    Forced sexual activity: Not on file  Other Topics Concern  . Not on file  Social History Narrative   Active, walks regularly.     BP 140/80   Pulse 89   Ht 5\' 7"  (1.702 m)   Wt 142 lb 6.4 oz (64.6 kg)   SpO2 96%   BMI 22.30 kg/m   Physical Exam:  Chronically ill appearing NAD HEENT: Unremarkable Neck:  6 cm JVD, no thyromegally Lymphatics:  No adenopathy Back:   No CVA tenderness Lungs:  Clear with no wheezes HEART:  Regular rate rhythm, no murmurs, no rubs, no  clicks Abd:  soft, positive bowel sounds, no organomegally, no rebound, no guarding Ext:  2 plus pulses, no edema, no cyanosis, no clubbing Skin:  No rashes no nodules Neuro:  CN II through XII intact, motor grossly intact  EKG - nsr  DEVICE  Normal device function.  See PaceArt for details.   Assess/Plan: 1. ICD - his medtronic device is working normally. We will recheck in several months. 2. Chronic systolic heart failure - his symptoms are class 2. We will follow. 3. VT - he has not had any ventricular arrhythmias since his last visit. 4. CAD - he denies anginal symptoms although he has become more sedentary in the past few months since his injury.  Mikle Bosworth.D.

## 2018-07-12 LAB — CUP PACEART INCLINIC DEVICE CHECK
Battery Remaining Longevity: 103 mo
Battery Voltage: 3 V
Date Time Interrogation Session: 20190731161455
HighPow Impedance: 41 Ohm
HighPow Impedance: 61 Ohm
Implantable Lead Location: 753860
Implantable Lead Model: 6947
Implantable Pulse Generator Implant Date: 20160602
Lead Channel Pacing Threshold Pulse Width: 0.4 ms
Lead Channel Sensing Intrinsic Amplitude: 24.375 mV
Lead Channel Setting Pacing Amplitude: 2.5 V
Lead Channel Setting Pacing Pulse Width: 0.4 ms
MDC IDC LEAD IMPLANT DT: 20070814
MDC IDC MSMT LEADCHNL RV IMPEDANCE VALUE: 456 Ohm
MDC IDC MSMT LEADCHNL RV IMPEDANCE VALUE: 475 Ohm
MDC IDC MSMT LEADCHNL RV PACING THRESHOLD AMPLITUDE: 1 V
MDC IDC MSMT LEADCHNL RV SENSING INTR AMPL: 24.25 mV
MDC IDC SET LEADCHNL RV SENSING SENSITIVITY: 0.3 mV
MDC IDC STAT BRADY RV PERCENT PACED: 0.07 %

## 2018-07-26 ENCOUNTER — Other Ambulatory Visit: Payer: Self-pay | Admitting: Internal Medicine

## 2018-07-26 MED ORDER — SPIRONOLACTONE 25 MG PO TABS: 12.5000 mg | ORAL_TABLET | Freq: Every day | ORAL | 3 refills | Status: DC

## 2018-08-12 DIAGNOSIS — Z029 Encounter for administrative examinations, unspecified: Secondary | ICD-10-CM

## 2018-08-12 DIAGNOSIS — M40294 Other kyphosis, thoracic region: Secondary | ICD-10-CM

## 2018-08-13 ENCOUNTER — Other Ambulatory Visit: Payer: Self-pay | Admitting: Cardiology

## 2018-08-18 ENCOUNTER — Other Ambulatory Visit: Payer: Self-pay | Admitting: Internal Medicine

## 2018-08-23 DIAGNOSIS — Z85828 Personal history of other malignant neoplasm of skin: Secondary | ICD-10-CM | POA: Diagnosis not present

## 2018-08-23 DIAGNOSIS — L821 Other seborrheic keratosis: Secondary | ICD-10-CM | POA: Diagnosis not present

## 2018-08-23 DIAGNOSIS — D1801 Hemangioma of skin and subcutaneous tissue: Secondary | ICD-10-CM | POA: Diagnosis not present

## 2018-08-23 DIAGNOSIS — L400 Psoriasis vulgaris: Secondary | ICD-10-CM | POA: Diagnosis not present

## 2018-08-23 DIAGNOSIS — L720 Epidermal cyst: Secondary | ICD-10-CM | POA: Diagnosis not present

## 2018-08-23 DIAGNOSIS — D225 Melanocytic nevi of trunk: Secondary | ICD-10-CM | POA: Diagnosis not present

## 2018-08-23 DIAGNOSIS — D692 Other nonthrombocytopenic purpura: Secondary | ICD-10-CM | POA: Diagnosis not present

## 2018-08-25 ENCOUNTER — Ambulatory Visit (INDEPENDENT_AMBULATORY_CARE_PROVIDER_SITE_OTHER): Payer: Medicare Other | Admitting: Podiatry

## 2018-08-25 ENCOUNTER — Encounter: Payer: Self-pay | Admitting: Podiatry

## 2018-08-25 VITALS — BP 176/91 | HR 78

## 2018-08-25 DIAGNOSIS — B351 Tinea unguium: Secondary | ICD-10-CM | POA: Diagnosis not present

## 2018-08-25 DIAGNOSIS — I259 Chronic ischemic heart disease, unspecified: Secondary | ICD-10-CM | POA: Diagnosis not present

## 2018-08-25 DIAGNOSIS — M79675 Pain in left toe(s): Secondary | ICD-10-CM

## 2018-08-25 DIAGNOSIS — M79674 Pain in right toe(s): Secondary | ICD-10-CM | POA: Diagnosis not present

## 2018-08-28 NOTE — Progress Notes (Signed)
Subjective:   Patient ID: Corey Jordan, male   DOB: 84 y.o.   MRN: [id number]   HPI 84 year old male presents the office today for concerns of thick, painful, elongated toenails that he cannot trim himself.  He denies any redness or drainage or any swelling to the toenail sites.  He has no other concerns to his lower extremities he denies any ulcerations.   Review of Systems  All other systems reviewed and are negative.  Past Medical History:  Diagnosis Date  . Anxiety   . Atrial fibrillation ([place])   . Cardiac defibrillator in situ    s/p MDT Maximo VR single lead ICD - 07/06/2006  . 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  . Degenerative joint disease   . Depression   . H/O hiatal hernia    times 3  . History of orthostatic hypotension   . Hyperlipidemia   . Hypertension   . Hypothyroidism   . Ischemic cardiomyopathy    EF 40% 12/2008 V gram;  Echocardiogram 11/08/11: EF 30-35%, inferoseptal and anteroseptal, apical septal/lateral/anterior/inferior and true apical akinesis, no LV thrombus, grade 1 diastolic dysfunction.  . Lumbar spondylosis    s/p L5-S1 fusion 12/11/2009  . Premature atrial contractions   . Premature ventricular contractions   . Skin cancer    skin  . Systolic CHF, chronic ([place])     Past Surgical History:  Procedure Laterality Date  . BACK SURGERY     x 2  . EP IMPLANTABLE DEVICE [name] 04/25/2015   Procedure: ICD Generator Changeout;  Surgeon: Evans Lance, MD;  Location: [place] [facility];  Service: Cardiovascular;  Laterality: [name];  . icd     s/p MDT Maximo VR single lead ICD - 07/06/2006  . INGUINAL HERNIA REPAIR Left    x 3  . LEFT HEART CATHETERIZATION WITH  CORONARY ANGIOGRAM [name] 11/09/2011   Procedure: LEFT HEART CATHETERIZATION WITH CORONARY ANGIOGRAM;  Surgeon: Josue Hector, MD;  Location: [facility] CATH LAB;  Service: Cardiovascular;  Laterality: [name];  . LUMBAR FUSION     s/p L5-S1 fusion 12/11/2009  . MOHS SURGERY     x 3, head nose  . SKIN CANCER EXCISION       Current Outpatient Medications:  .  aspirin 81 MG EC tablet, Take 81 mg by mouth daily.  , Disp: , Rfl:  .  atorvastatin (LIPITOR) 40 MG tablet, TAKE 1 TABLET BY MOUTH DAILY, Disp: 90 tablet, Rfl: 2 .  calcium carbonate (OS-CAL) 600 MG TABS, Take 600 mg by mouth daily. , Disp: , Rfl:  .  carvedilol (COREG) 6.25 MG tablet, Take 1 tablet (6.25 mg total) by mouth 2 (two) times daily with a meal., Disp: 180 tablet, Rfl: 2 .  cholecalciferol (VITAMIN D) 1000 UNITS tablet, Take 1,000 Units by mouth daily. , Disp: , Rfl:  .  furosemide (LASIX) 20 MG tablet, One po every other day for dependent edema, Disp: 30 tablet, Rfl: 3 .  levothyroxine (SYNTHROID, LEVOTHROID) 50 MCG tablet, Take 1 tablet (50 mcg total) by mouth every morning., Disp: 90 tablet, Rfl: 3 .  mupirocin ointment (BACTROBAN) 2 %, Apply to abrasion twice a day, Disp:  22 g, Rfl: 0 .  nitroGLYCERIN (NITROSTAT) 0.4 MG SL tablet, Place 1 tablet (0.4 mg total) under the tongue every 5 (five) minutes as needed. For chest pain, Disp: 25 tablet, Rfl: 6 .  omeprazole (PRILOSEC) 20 MG capsule, Take 1 capsule (20 mg total) by mouth daily as needed (heartburn)., Disp: 30 capsule, Rfl: 2 .  ramipril (ALTACE) 5 MG capsule, Take 5 mg by mouth daily., Disp: , Rfl:  .  spironolactone (ALDACTONE) 25 MG tablet, Take 0.5 tablets (12.5 mg total) by mouth daily., Disp: 45 tablet, Rfl: 3  Allergies  Allergen Reactions  . Pollen Extract Other (See Comments)    Sneezing and watery eyes         Objective:  Physical Exam  General: AAO x3, NAD  Dermatological: Nails are hypertrophic, dystrophic, brittle, discolored, elongated 10. No surrounding  redness or drainage. Tenderness nails 1-5 bilaterally. No open lesions or pre-ulcerative lesions are identified today.  Vascular: Dorsalis Pedis artery and Posterior Tibial artery pedal pulses are 2/4 bilateral with immedate capillary fill time. There is no pain with calf compression, swelling, warmth, erythema.   Neruologic: Grossly intact via light touch bilateral.Protective threshold with Semmes Wienstein monofilament intact to all pedal sites bilateral.   Musculoskeletal: No gross boney pedal deformities bilateral. No pain, crepitus, or limitation noted with foot and ankle range of motion bilateral. Muscular strength 5/5 in all groups tested bilateral.     Assessment:   84 year old male with symptomatic onychomycosis    Plan:  -Treatment options discussed including all alternatives, risks, and complications -Etiology of symptoms were discussed -Nails debrided 10 without complications or bleeding. -Daily foot inspection -Follow-up in 3 months or sooner if any problems arise. In the meantime, encouraged to call the office with any questions, concerns, change in symptoms.   Celesta Gentile, DPM

## 2018-08-29 ENCOUNTER — Ambulatory Visit (INDEPENDENT_AMBULATORY_CARE_PROVIDER_SITE_OTHER): Payer: Medicare Other | Admitting: *Deleted

## 2018-08-29 DIAGNOSIS — I5022 Chronic systolic (congestive) heart failure: Secondary | ICD-10-CM

## 2018-08-29 DIAGNOSIS — I255 Ischemic cardiomyopathy: Secondary | ICD-10-CM

## 2018-08-29 NOTE — Progress Notes (Signed)
Remote ICD transmission.   

## 2018-09-07 ENCOUNTER — Encounter: Payer: Self-pay | Admitting: Cardiology

## 2018-09-20 LAB — CUP PACEART REMOTE DEVICE CHECK
Battery Voltage: 3 V
Brady Statistic RV Percent Paced: 0.09 %
HIGH POWER IMPEDANCE MEASURED VALUE: 39 Ohm
HIGH POWER IMPEDANCE MEASURED VALUE: 58 Ohm
Implantable Lead Implant Date: 20070814
Lead Channel Impedance Value: 399 Ohm
Lead Channel Pacing Threshold Amplitude: 1 V
Lead Channel Pacing Threshold Pulse Width: 0.4 ms
Lead Channel Sensing Intrinsic Amplitude: 22.75 mV
Lead Channel Sensing Intrinsic Amplitude: 22.75 mV
MDC IDC LEAD LOCATION: 753860
MDC IDC MSMT BATTERY REMAINING LONGEVITY: 99 mo
MDC IDC MSMT LEADCHNL RV IMPEDANCE VALUE: 475 Ohm
MDC IDC PG IMPLANT DT: 20160602
MDC IDC SESS DTM: 20191007042209
MDC IDC SET LEADCHNL RV PACING AMPLITUDE: 2.5 V
MDC IDC SET LEADCHNL RV PACING PULSEWIDTH: 0.4 ms
MDC IDC SET LEADCHNL RV SENSING SENSITIVITY: 0.3 mV

## 2018-09-29 ENCOUNTER — Other Ambulatory Visit: Payer: Self-pay | Admitting: Cardiology

## 2018-09-29 DIAGNOSIS — H6122 Impacted cerumen, left ear: Secondary | ICD-10-CM | POA: Diagnosis not present

## 2018-09-30 NOTE — Telephone Encounter (Signed)
Rx request sent to pharmacy.  

## 2018-10-19 ENCOUNTER — Other Ambulatory Visit: Payer: Self-pay | Admitting: Internal Medicine

## 2018-10-19 DIAGNOSIS — R7302 Impaired glucose tolerance (oral): Secondary | ICD-10-CM

## 2018-10-19 DIAGNOSIS — E7849 Other hyperlipidemia: Secondary | ICD-10-CM

## 2018-10-19 DIAGNOSIS — E039 Hypothyroidism, unspecified: Secondary | ICD-10-CM

## 2018-10-19 DIAGNOSIS — I10 Essential (primary) hypertension: Secondary | ICD-10-CM

## 2018-10-23 ENCOUNTER — Other Ambulatory Visit: Payer: Self-pay | Admitting: Cardiology

## 2018-10-25 ENCOUNTER — Other Ambulatory Visit: Payer: Medicare Other | Admitting: Internal Medicine

## 2018-10-25 DIAGNOSIS — I10 Essential (primary) hypertension: Secondary | ICD-10-CM | POA: Diagnosis not present

## 2018-10-25 DIAGNOSIS — E7849 Other hyperlipidemia: Secondary | ICD-10-CM

## 2018-10-25 DIAGNOSIS — R7302 Impaired glucose tolerance (oral): Secondary | ICD-10-CM | POA: Diagnosis not present

## 2018-10-25 DIAGNOSIS — E039 Hypothyroidism, unspecified: Secondary | ICD-10-CM | POA: Diagnosis not present

## 2018-10-26 LAB — LIPID PANEL
Cholesterol: 140 mg/dL (ref <200)
HDL: 69 mg/dL (ref >40)
LDL Cholesterol (Calc): 57 mg/dL (calc)
Non-HDL Cholesterol (Calc): 71 mg/dL (calc) (ref <130)
TRIGLYCERIDES: 48 mg/dL (ref <150)
Total CHOL/HDL Ratio: 2 (calc) (ref <5.0)

## 2018-10-26 LAB — HEPATIC FUNCTION PANEL
AG Ratio: 2.2 (calc) (ref 1.0–2.5)
ALKALINE PHOSPHATASE (APISO): 68 U/L (ref 40–115)
ALT: 15 U/L (ref 9–46)
AST: 21 U/L (ref 10–35)
Albumin: 4.3 g/dL (ref 3.6–5.1)
BILIRUBIN TOTAL: 0.9 mg/dL (ref 0.2–1.2)
Bilirubin, Direct: 0.2 mg/dL (ref 0.0–0.2)
Globulin: 2 g/dL (calc) (ref 1.9–3.7)
Indirect Bilirubin: 0.7 mg/dL (calc) (ref 0.2–1.2)
TOTAL PROTEIN: 6.3 g/dL (ref 6.1–8.1)

## 2018-10-26 LAB — MICROALBUMIN / CREATININE URINE RATIO
Creatinine, Urine: 142 mg/dL (ref 20–320)
MICROALB UR: 0.7 mg/dL
MICROALB/CREAT RATIO: 5 ug/mg{creat} (ref <30)

## 2018-10-26 LAB — HEMOGLOBIN A1C
EAG (MMOL/L): 6.3 (calc)
Hgb A1c MFr Bld: 5.6 % of total Hgb (ref <5.7)
Mean Plasma Glucose: 114 (calc)

## 2018-10-26 LAB — TSH: TSH: 1.91 m[IU]/L (ref 0.40–4.50)

## 2018-10-27 ENCOUNTER — Encounter: Payer: Self-pay | Admitting: Internal Medicine

## 2018-10-27 ENCOUNTER — Ambulatory Visit (INDEPENDENT_AMBULATORY_CARE_PROVIDER_SITE_OTHER): Payer: Medicare Other | Admitting: Internal Medicine

## 2018-10-27 VITALS — BP 160/90 | HR 77 | Ht 67.0 in | Wt 144.0 lb

## 2018-10-27 DIAGNOSIS — E7849 Other hyperlipidemia: Secondary | ICD-10-CM | POA: Diagnosis not present

## 2018-10-27 DIAGNOSIS — Z23 Encounter for immunization: Secondary | ICD-10-CM | POA: Diagnosis not present

## 2018-10-27 DIAGNOSIS — R7302 Impaired glucose tolerance (oral): Secondary | ICD-10-CM

## 2018-10-27 DIAGNOSIS — M40204 Unspecified kyphosis, thoracic region: Secondary | ICD-10-CM

## 2018-10-27 DIAGNOSIS — E039 Hypothyroidism, unspecified: Secondary | ICD-10-CM

## 2018-10-27 DIAGNOSIS — I259 Chronic ischemic heart disease, unspecified: Secondary | ICD-10-CM | POA: Diagnosis not present

## 2018-10-27 DIAGNOSIS — I255 Ischemic cardiomyopathy: Secondary | ICD-10-CM | POA: Diagnosis not present

## 2018-10-27 DIAGNOSIS — I10 Essential (primary) hypertension: Secondary | ICD-10-CM | POA: Diagnosis not present

## 2018-10-27 MED ORDER — LEVOTHYROXINE SODIUM 50 MCG PO TABS: 50.0000 ug | ORAL_TABLET | ORAL | 3 refills | Status: DC

## 2018-10-27 NOTE — Progress Notes (Signed)
   Subjective:    Patient ID: Corey Jordan, male    DOB: 01/21/1934, 84 y.o.   MRN: [id number]  HPI 84 year old Male with history of hypothyroidism, ischemic cardiomyopathy and significant thoracic kyphosis with inability to raise his head in erect position in today for 5-month follow-up.  Situations are stable.  Unfortunately he cannot drive due to significant issues kyphosis and has been evaluated by Dr. Vertell Limber.  Surgery has not been advised due to cardiac condition.  He has history of impaired glucose tolerance but hemoglobin A1c is excellent at 5.6%  TSH is normal on thyroid replacement.  Lipid panel and liver functions are normal.    Review of Systems his spirits are good despite his situation     Objective:   Physical Exam Skin warm and dry.  Nodes none.  Neck is supple without JVD thyromegaly or carotid bruits.  Cardiac exam regular rate and rhythm.  Extremities without edema.       Assessment & Plan:  Severe thoracic kyphosis-unable to raise head and correct position  Ischemic cardiomyopathy  Essential hypertension-stable  Hyperlipidemia-lipid panel and liver functions normal on statin therapy  History of MI and ischemic cardiomyopathy followed by cardiology  GE reflux-treated with PPI  Hypothyroidism-stable on thyroid replacement  Impaired glucose tolerance with stable hemoglobin A1c  Dependent edema treated with Spironolactone and Lasix  Plan: Continue current medications and follow-up in 6 months.  Flu vaccine given.

## 2018-11-14 ENCOUNTER — Other Ambulatory Visit: Payer: Self-pay | Admitting: Cardiology

## 2018-11-22 NOTE — Patient Instructions (Signed)
Your lab work is very acceptable.  Continue same medications and follow-up for physical exam in 6 months.  Flu vaccine given.

## 2018-11-28 ENCOUNTER — Ambulatory Visit (INDEPENDENT_AMBULATORY_CARE_PROVIDER_SITE_OTHER): Payer: Medicare Other

## 2018-11-28 DIAGNOSIS — I255 Ischemic cardiomyopathy: Secondary | ICD-10-CM

## 2018-11-29 LAB — CUP PACEART REMOTE DEVICE CHECK
Battery Remaining Longevity: 95 mo
Battery Voltage: 3 V
Brady Statistic RV Percent Paced: 0.24 %
Date Time Interrogation Session: 20200106051804
HIGH POWER IMPEDANCE MEASURED VALUE: 45 Ohm
HighPow Impedance: 66 Ohm
Implantable Lead Implant Date: 20070814
Implantable Lead Location: 753860
Implantable Pulse Generator Implant Date: 20160602
Lead Channel Impedance Value: 418 Ohm
Lead Channel Impedance Value: 475 Ohm
Lead Channel Pacing Threshold Amplitude: 1.125 V
Lead Channel Pacing Threshold Pulse Width: 0.4 ms
Lead Channel Sensing Intrinsic Amplitude: 21.25 mV
Lead Channel Sensing Intrinsic Amplitude: 21.25 mV
Lead Channel Setting Pacing Amplitude: 2.5 V
Lead Channel Setting Pacing Pulse Width: 0.4 ms
Lead Channel Setting Sensing Sensitivity: 0.3 mV

## 2018-11-29 NOTE — Progress Notes (Signed)
Remote ICD transmission.   

## 2018-12-01 ENCOUNTER — Ambulatory Visit: Payer: Medicare Other | Admitting: Podiatry

## 2018-12-28 DIAGNOSIS — H6121 Impacted cerumen, right ear: Secondary | ICD-10-CM | POA: Diagnosis not present

## 2019-01-26 ENCOUNTER — Ambulatory Visit (INDEPENDENT_AMBULATORY_CARE_PROVIDER_SITE_OTHER): Payer: Medicare Other | Admitting: Podiatry

## 2019-01-26 ENCOUNTER — Encounter: Payer: Self-pay | Admitting: Podiatry

## 2019-01-26 DIAGNOSIS — B351 Tinea unguium: Secondary | ICD-10-CM | POA: Diagnosis not present

## 2019-01-26 DIAGNOSIS — M79674 Pain in right toe(s): Secondary | ICD-10-CM | POA: Diagnosis not present

## 2019-01-26 DIAGNOSIS — M79675 Pain in left toe(s): Secondary | ICD-10-CM

## 2019-01-30 NOTE — Progress Notes (Signed)
Subjective: 85 y.o. returns the office today for painful, elongated, thickened toenails which they cannot trim themself. Denies any redness or drainage around the nails.  Denies any systemic complaints such as fevers, chills, nausea, vomiting.   PCP: Elby Showers, MD  Objective: AAO 3, NAD DP/PT pulses palpable, CRT less than 3 seconds Nails hypertrophic, dystrophic, elongated, brittle, discolored 10. There is tenderness overlying the nails 1-5 bilaterally. There is no surrounding erythema or drainage along the nail sites. No open lesions or pre-ulcerative lesions are identified. No other areas of tenderness bilateral lower extremities. No overlying edema, erythema, increased warmth. No pain with calf compression, swelling, warmth, erythema.  Assessment: Patient presents with symptomatic onychomycosis  Plan: -Treatment options including alternatives, risks, complications were discussed -Nails sharply debrided 10 without complication/bleeding. -Discussed daily foot inspection. If there are any changes, to call the office immediately.  -Follow-up in 3 months or sooner if any problems are to arise. In the meantime, encouraged to call the office with any questions, concerns, changes symptoms.  Celesta Gentile, DPM

## 2019-02-25 ENCOUNTER — Other Ambulatory Visit: Payer: Self-pay | Admitting: Cardiology

## 2019-02-27 ENCOUNTER — Other Ambulatory Visit: Payer: Self-pay

## 2019-02-27 ENCOUNTER — Ambulatory Visit (INDEPENDENT_AMBULATORY_CARE_PROVIDER_SITE_OTHER): Payer: Medicare Other | Admitting: *Deleted

## 2019-02-27 DIAGNOSIS — I255 Ischemic cardiomyopathy: Secondary | ICD-10-CM | POA: Diagnosis not present

## 2019-02-27 LAB — CUP PACEART REMOTE DEVICE CHECK
Battery Remaining Longevity: 95 mo
Battery Voltage: 2.99 V
Brady Statistic RV Percent Paced: 0.17 %
Date Time Interrogation Session: 20200406103426
HighPow Impedance: 38 Ohm
HighPow Impedance: 53 Ohm
Implantable Lead Implant Date: 20070814
Implantable Lead Location: 753860
Implantable Lead Model: 6947
Implantable Pulse Generator Implant Date: 20160602
Lead Channel Impedance Value: 361 Ohm
Lead Channel Impedance Value: 418 Ohm
Lead Channel Pacing Threshold Amplitude: 0.75 V
Lead Channel Pacing Threshold Pulse Width: 0.4 ms
Lead Channel Sensing Intrinsic Amplitude: 23.125 mV
Lead Channel Sensing Intrinsic Amplitude: 23.125 mV
Lead Channel Setting Pacing Amplitude: 2.5 V
Lead Channel Setting Pacing Pulse Width: 0.4 ms
Lead Channel Setting Sensing Sensitivity: 0.3 mV

## 2019-03-08 ENCOUNTER — Encounter: Payer: Self-pay | Admitting: Cardiology

## 2019-03-08 NOTE — Progress Notes (Signed)
Remote ICD transmission.   

## 2019-04-24 ENCOUNTER — Other Ambulatory Visit: Payer: Medicare Other | Admitting: Internal Medicine

## 2019-04-24 DIAGNOSIS — R7302 Impaired glucose tolerance (oral): Secondary | ICD-10-CM

## 2019-04-24 DIAGNOSIS — Z0000 Encounter for general adult medical examination without abnormal findings: Secondary | ICD-10-CM | POA: Diagnosis not present

## 2019-04-24 DIAGNOSIS — M40204 Unspecified kyphosis, thoracic region: Secondary | ICD-10-CM

## 2019-04-24 DIAGNOSIS — E039 Hypothyroidism, unspecified: Secondary | ICD-10-CM

## 2019-04-24 DIAGNOSIS — I10 Essential (primary) hypertension: Secondary | ICD-10-CM | POA: Diagnosis not present

## 2019-04-24 DIAGNOSIS — I255 Ischemic cardiomyopathy: Secondary | ICD-10-CM

## 2019-04-24 DIAGNOSIS — E7849 Other hyperlipidemia: Secondary | ICD-10-CM | POA: Diagnosis not present

## 2019-04-25 LAB — COMPLETE METABOLIC PANEL WITH GFR
AG Ratio: 2.2 (calc) (ref 1.0–2.5)
ALT: 33 U/L (ref 9–46)
AST: 29 U/L (ref 10–35)
Albumin: 4.6 g/dL (ref 3.6–5.1)
Alkaline phosphatase (APISO): 69 U/L (ref 35–144)
BUN: 16 mg/dL (ref 7–25)
CO2: 30 mmol/L (ref 20–32)
Calcium: 10.4 mg/dL — ABNORMAL HIGH (ref 8.6–10.3)
Chloride: 101 mmol/L (ref 98–110)
Creat: 1.06 mg/dL (ref 0.70–1.11)
GFR, Est African American: 75 mL/min/{1.73_m2} (ref >=60)
GFR, Est Non African American: 65 mL/min/{1.73_m2} (ref >=60)
Globulin: 2.1 g/dL (calc) (ref 1.9–3.7)
Glucose, Bld: 91 mg/dL (ref 65–99)
Potassium: 4.9 mmol/L (ref 3.5–5.3)
Sodium: 140 mmol/L (ref 135–146)
Total Bilirubin: 0.9 mg/dL (ref 0.2–1.2)
Total Protein: 6.7 g/dL (ref 6.1–8.1)

## 2019-04-25 LAB — PSA: PSA: 3.3 ng/mL (ref <=4.0)

## 2019-04-25 LAB — CBC WITH DIFFERENTIAL/PLATELET
Absolute Monocytes: 589 cells/uL (ref 200–950)
Basophils Absolute: 39 cells/uL (ref 0–200)
Basophils Relative: 0.7 %
Eosinophils Absolute: 121 cells/uL (ref 15–500)
Eosinophils Relative: 2.2 %
HCT: 40.9 % (ref 38.5–50.0)
Hemoglobin: 13.5 g/dL (ref 13.2–17.1)
Lymphs Abs: 1023 cells/uL (ref 850–3900)
MCH: 30.5 pg (ref 27.0–33.0)
MCHC: 33 g/dL (ref 32.0–36.0)
MCV: 92.5 fL (ref 80.0–100.0)
MPV: 11 fL (ref 7.5–12.5)
Monocytes Relative: 10.7 %
Neutro Abs: 3729 cells/uL (ref 1500–7800)
Neutrophils Relative %: 67.8 %
Platelets: 201 10*3/uL (ref 140–400)
RBC: 4.42 10*6/uL (ref 4.20–5.80)
RDW: 12.4 % (ref 11.0–15.0)
Total Lymphocyte: 18.6 %
WBC: 5.5 10*3/uL (ref 3.8–10.8)

## 2019-04-25 LAB — LIPID PANEL
Cholesterol: 156 mg/dL (ref <200)
HDL: 79 mg/dL (ref >=40)
LDL Cholesterol (Calc): 64 mg/dL (calc)
Non-HDL Cholesterol (Calc): 77 mg/dL (calc) (ref <130)
Total CHOL/HDL Ratio: 2 (calc) (ref <5.0)
Triglycerides: 54 mg/dL (ref <150)

## 2019-04-25 LAB — HEMOGLOBIN A1C
Hgb A1c MFr Bld: 5.6 % of total Hgb (ref <5.7)
Mean Plasma Glucose: 114 (calc)
eAG (mmol/L): 6.3 (calc)

## 2019-04-25 LAB — MICROALBUMIN / CREATININE URINE RATIO
Creatinine, Urine: 96 mg/dL (ref 20–320)
Microalb Creat Ratio: 7 mcg/mg creat (ref <30)
Microalb, Ur: 0.7 mg/dL

## 2019-04-27 ENCOUNTER — Ambulatory Visit (INDEPENDENT_AMBULATORY_CARE_PROVIDER_SITE_OTHER): Payer: Medicare Other | Admitting: Podiatry

## 2019-04-27 ENCOUNTER — Other Ambulatory Visit: Payer: Self-pay

## 2019-04-27 DIAGNOSIS — M79674 Pain in right toe(s): Secondary | ICD-10-CM | POA: Diagnosis not present

## 2019-04-27 DIAGNOSIS — B351 Tinea unguium: Secondary | ICD-10-CM

## 2019-04-27 DIAGNOSIS — M79675 Pain in left toe(s): Secondary | ICD-10-CM | POA: Diagnosis not present

## 2019-04-28 ENCOUNTER — Ambulatory Visit (INDEPENDENT_AMBULATORY_CARE_PROVIDER_SITE_OTHER): Payer: Medicare Other | Admitting: Internal Medicine

## 2019-04-28 ENCOUNTER — Encounter: Payer: Self-pay | Admitting: Internal Medicine

## 2019-04-28 VITALS — BP 140/80 | HR 80 | Temp 98.2°F | Ht 67.0 in | Wt 140.0 lb

## 2019-04-28 DIAGNOSIS — I10 Essential (primary) hypertension: Secondary | ICD-10-CM

## 2019-04-28 DIAGNOSIS — I5189 Other ill-defined heart diseases: Secondary | ICD-10-CM | POA: Diagnosis not present

## 2019-04-28 DIAGNOSIS — M40204 Unspecified kyphosis, thoracic region: Secondary | ICD-10-CM | POA: Diagnosis not present

## 2019-04-28 DIAGNOSIS — Z0000 Encounter for general adult medical examination without abnormal findings: Secondary | ICD-10-CM

## 2019-04-28 DIAGNOSIS — I255 Ischemic cardiomyopathy: Secondary | ICD-10-CM | POA: Diagnosis not present

## 2019-04-28 DIAGNOSIS — E039 Hypothyroidism, unspecified: Secondary | ICD-10-CM

## 2019-04-28 DIAGNOSIS — I252 Old myocardial infarction: Secondary | ICD-10-CM

## 2019-04-28 DIAGNOSIS — I5022 Chronic systolic (congestive) heart failure: Secondary | ICD-10-CM

## 2019-04-28 DIAGNOSIS — E7849 Other hyperlipidemia: Secondary | ICD-10-CM | POA: Diagnosis not present

## 2019-04-28 DIAGNOSIS — R7302 Impaired glucose tolerance (oral): Secondary | ICD-10-CM

## 2019-04-28 DIAGNOSIS — Z125 Encounter for screening for malignant neoplasm of prostate: Secondary | ICD-10-CM

## 2019-04-28 DIAGNOSIS — R609 Edema, unspecified: Secondary | ICD-10-CM | POA: Diagnosis not present

## 2019-04-28 DIAGNOSIS — Z95810 Presence of automatic (implantable) cardiac defibrillator: Secondary | ICD-10-CM
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
## 2019-05-04 NOTE — Progress Notes (Signed)
Subjective: 85 y.o. returns the office today for painful, elongated, thickened toenails which they cannot trim themself. Denies any redness or drainage around the nails.  Denies any systemic complaints such as fevers, chills, nausea, vomiting.   PCP: Elby Showers, MD  Objective: AAO 3, NAD DP/PT pulses palpable, CRT less than 3 seconds Nails hypertrophic, dystrophic, elongated, brittle, discolored 10. There is tenderness overlying the nails 1-5 bilaterally. There is no surrounding erythema or drainage along the nail sites. No open lesions or pre-ulcerative lesions are identified. No pain with calf compression, swelling, warmth, erythema.  Assessment: Patient presents with symptomatic onychomycosis  Plan: -Treatment options including alternatives, risks, complications were discussed -Nails sharply debrided 10 without complication/bleeding. -Discussed daily foot inspection. If there are any changes, to call the office immediately.  -Follow-up in 3 months or sooner if any problems are to arise. In the meantime, encouraged to call the office with any questions, concerns, changes symptoms.  Celesta Gentile, DPM

## 2019-05-09 ENCOUNTER — Other Ambulatory Visit: Payer: Self-pay | Admitting: Cardiology

## 2019-05-09 ENCOUNTER — Other Ambulatory Visit: Payer: Self-pay | Admitting: Internal Medicine

## 2019-05-13 ENCOUNTER — Other Ambulatory Visit: Payer: Self-pay | Admitting: Cardiology

## 2019-05-20 NOTE — Patient Instructions (Signed)
Please take care of yourself.  My return in 1 year or as needed.  Continue same medications.

## 2019-05-22 LAB — POCT URINALYSIS DIPSTICK
Appearance: NEGATIVE
Bilirubin, UA: NEGATIVE
Blood, UA: NEGATIVE
Glucose, UA: NEGATIVE
Ketones, UA: NEGATIVE
Leukocytes, UA: NEGATIVE
Nitrite, UA: NEGATIVE
Odor: NEGATIVE
Protein, UA: NEGATIVE
Spec Grav, UA: 1.015 (ref 1.010–1.025)
Urobilinogen, UA: 0.2 E.U./dL
pH, UA: 6.5 (ref 5.0–8.0)

## 2019-05-29 ENCOUNTER — Ambulatory Visit (INDEPENDENT_AMBULATORY_CARE_PROVIDER_SITE_OTHER): Payer: Medicare Other | Admitting: *Deleted

## 2019-05-29 DIAGNOSIS — I255 Ischemic cardiomyopathy: Secondary | ICD-10-CM | POA: Diagnosis not present

## 2019-05-29 LAB — CUP PACEART REMOTE DEVICE CHECK
Battery Remaining Longevity: 91 mo
Battery Voltage: 3 V
Brady Statistic RV Percent Paced: 0.29 %
Date Time Interrogation Session: 20200706073523
HighPow Impedance: 40 Ohm
HighPow Impedance: 55 Ohm
Implantable Lead Implant Date: 20070814
Implantable Lead Location: 753860
Implantable Lead Model: 6947
Implantable Pulse Generator Implant Date: 20160602
Lead Channel Impedance Value: 361 Ohm
Lead Channel Impedance Value: 456 Ohm
Lead Channel Pacing Threshold Amplitude: 1 V
Lead Channel Pacing Threshold Pulse Width: 0.4 ms
Lead Channel Sensing Intrinsic Amplitude: 23.75 mV
Lead Channel Sensing Intrinsic Amplitude: 23.75 mV
Lead Channel Setting Pacing Amplitude: 2.5 V
Lead Channel Setting Pacing Pulse Width: 0.4 ms
Lead Channel Setting Sensing Sensitivity: 0.3 mV

## 2019-06-08 ENCOUNTER — Encounter: Payer: Self-pay | Admitting: Cardiology

## 2019-06-08 NOTE — Progress Notes (Signed)
Remote ICD transmission.   

## 2019-06-22 ENCOUNTER — Telehealth: Payer: Self-pay

## 2019-06-22 NOTE — Telephone Encounter (Signed)
Called pt to do COVID screening...no answer.

## 2019-06-23 ENCOUNTER — Encounter: Payer: Self-pay | Admitting: Internal Medicine

## 2019-06-23 ENCOUNTER — Ambulatory Visit (INDEPENDENT_AMBULATORY_CARE_PROVIDER_SITE_OTHER): Payer: Medicare Other | Admitting: Internal Medicine

## 2019-06-23 ENCOUNTER — Other Ambulatory Visit: Payer: Self-pay

## 2019-06-23 VITALS — BP 160/82 | HR 78 | Ht 67.0 in | Wt 138.0 lb

## 2019-06-23 DIAGNOSIS — Z95810 Presence of automatic (implantable) cardiac defibrillator: Secondary | ICD-10-CM | POA: Diagnosis not present

## 2019-06-23 DIAGNOSIS — I255 Ischemic cardiomyopathy: Secondary | ICD-10-CM | POA: Diagnosis not present

## 2019-06-23 DIAGNOSIS — I4729 Other ventricular tachycardia: Secondary | ICD-10-CM | POA: Insufficient documentation

## 2019-06-23 DIAGNOSIS — I2510 Atherosclerotic heart disease of native coronary artery without angina pectoris: Secondary | ICD-10-CM

## 2019-06-23 DIAGNOSIS — I472 Ventricular tachycardia: Secondary | ICD-10-CM

## 2019-06-23 DIAGNOSIS — I5022 Chronic systolic (congestive) heart failure: Secondary | ICD-10-CM

## 2019-06-23 HISTORY — DX: Other ventricular tachycardia: I47.29

## 2019-06-23 HISTORY — DX: Ventricular tachycardia: I47.2

## 2019-06-23 LAB — CUP PACEART INCLINIC DEVICE CHECK
Battery Remaining Longevity: 90 mo
Battery Voltage: 2.99 V
Brady Statistic RV Percent Paced: 0.21 %
Date Time Interrogation Session: 20200731152052
HighPow Impedance: 48 Ohm
HighPow Impedance: 69 Ohm
Implantable Lead Implant Date: 20070814
Implantable Lead Location: 753860
Implantable Lead Model: 6947
Implantable Pulse Generator Implant Date: 20160602
Lead Channel Impedance Value: 418 Ohm
Lead Channel Impedance Value: 513 Ohm
Lead Channel Pacing Threshold Amplitude: 1 V
Lead Channel Pacing Threshold Pulse Width: 0.4 ms
Lead Channel Sensing Intrinsic Amplitude: 26.25 mV
Lead Channel Setting Pacing Amplitude: 2.5 V
Lead Channel Setting Pacing Pulse Width: 0.4 ms
Lead Channel Setting Sensing Sensitivity: 0.3 mV

## 2019-06-23 MED ORDER — NYSTATIN 100000 UNIT/GM EX POWD: Freq: Four times a day (QID) | CUTANEOUS | 0 refills | Status: DC

## 2019-06-23 NOTE — Progress Notes (Signed)
HPI Mr. Puebla returns today for followup of his ICD. The patient is a pleasant elderly man with a h/o ICM, chronic systolic heart failure, VT, s/p ICD insertion. He had fallen and broken his neck but was felt to be too high risk for neck surgery and now has a permanent flex in his C-spine. He c/o a smell under his chin. There is moisture and some redness suggestive of fungus. No ICD shocks.  Allergies  Allergen Reactions  . Pollen Extract Other (See Comments)    Sneezing and watery eyes     Current Outpatient Medications  Medication Sig Dispense Refill  . aspirin 81 MG EC tablet Take 81 mg by mouth daily.      Marland Kitchen atorvastatin (LIPITOR) 40 MG tablet TAKE 1 TABLET BY MOUTH DAILY 90 tablet 0  . calcium carbonate (OS-CAL) 600 MG TABS Take 600 mg by mouth daily.     . carvedilol (COREG) 6.25 MG tablet TAKE 1 TABLET(6.25 MG) BY MOUTH TWICE DAILY WITH A MEAL 180 tablet 0  . cholecalciferol (VITAMIN D) 1000 UNITS tablet Take 1,000 Units by mouth daily.     . furosemide (LASIX) 20 MG tablet One po every other day for dependent edema 30 tablet 3  . levothyroxine (SYNTHROID, LEVOTHROID) 50 MCG tablet Take 1 tablet (50 mcg total) by mouth every morning. 90 tablet 3  . mupirocin ointment (BACTROBAN) 2 % Apply to abrasion twice a day 22 g 0  . nitroGLYCERIN (NITROSTAT) 0.4 MG SL tablet PLACE 1 TABLET UNDER THE TONGUE IF NEEDED EVERY 5 MINUTES FOR CHEST PAIN FOR 3 DOSES IF NO RELIEF AFTER FIRST DOSE CALL PRESCRIBER OR 911 25 tablet 6  . omeprazole (PRILOSEC) 20 MG capsule Take 1 capsule (20 mg total) by mouth daily as needed (heartburn). 30 capsule 2  . ramipril (ALTACE) 5 MG capsule TAKE 1 CAPSULE DAILY 90 capsule 0  . spironolactone (ALDACTONE) 25 MG tablet Take 0.5 tablets (12.5 mg total) by mouth daily. 45 tablet 3   No current facility-administered medications for this visit.      Past Medical History:  Diagnosis Date  . Anxiety   . Atrial fibrillation (Protection)   . Cardiac  defibrillator in situ    s/p MDT Maximo VR single lead ICD - 07/06/2006  . 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  . Degenerative joint disease   . Depression   . H/O hiatal hernia    times 3  . History of orthostatic hypotension   . Hyperlipidemia   . Hypertension   . Hypothyroidism   . Ischemic cardiomyopathy    EF 40% 12/2008 V gram;  Echocardiogram 11/08/11: EF 30-35%, inferoseptal and anteroseptal, apical septal/lateral/anterior/inferior and true apical akinesis, no LV thrombus, grade 1 diastolic dysfunction.  . Lumbar spondylosis    s/p L5-S1 fusion 12/11/2009  . Premature atrial contractions   . Premature ventricular contractions   . Skin cancer    skin  . Systolic CHF, chronic (HCC)     ROS:   All systems reviewed and negative except as noted in the HPI.   Past Surgical History:  Procedure Laterality Date  . BACK SURGERY  x 2  . EP IMPLANTABLE DEVICE [name] 04/25/2015   Procedure: ICD Generator Changeout;  Surgeon: Evans Lance, MD;  Location: [place] [facility];  Service: Cardiovascular;  Laterality: [name];  . icd     s/p MDT Maximo VR single lead ICD - 07/06/2006  . INGUINAL HERNIA REPAIR Left    x 3  . LEFT HEART CATHETERIZATION WITH CORONARY ANGIOGRAM [name] 11/09/2011   Procedure: LEFT HEART CATHETERIZATION WITH CORONARY ANGIOGRAM;  Surgeon: Josue Hector, MD;  Location[facility] C[facility];  Service: Cardiovascular;  Laterality: [name];  . LUMBAR FUSION     s/p L5-S1 fusion 12/11/2009  . MOHS SURGERY     x 3, head nose  . SKIN CANCER EXCISION       Family History  Problem Relation Age of Onset  . Peripheral vascular disease Father        deceased 77  . Pneumonia Mother         deceased 92  . Prostate cancer Brother   . Diabetes Brother   . Heart disease Brother   . Cancer Brother        unknown type     Social History   Socioeconomic History  . Marital status: Married    Spouse name: Not on file  . Number of children: 2  . Years of education: Not on file  . Highest education level: Not on file  Occupational History  . Occupation: retired    Fish farm manager: [name]  Social Needs  . Financial resource strain: Not on file  . Food insecurity    Worry: Not on file    Inability: Not on file  . Transportation needs    Medical: Not on file    Non-medical: Not on file  Tobacco Use  . Smoking status: Never Smoker  . Smokeless tobacco: Never Used  Substance and Sexual Activity  . Alcohol use: No  . Drug use: No  . Sexual activity: Never  Lifestyle  . Physical activity    Days per week: Not on file    Minutes per session: Not on file  . Stress: Not on file  Relationships  . Social Herbalist on phone: Not on file    Gets together: Not on file    Attends religious service: Not on file    Active member of club or organization: Not on file    Attends meetings of clubs or organizations: Not on file    Relationship status: Not on file  . Intimate partner violence    Fear of current or ex partner: Not on file    Emotionally abused: Not on file    Physically abused: Not on file    Forced sexual activity: Not on file  Other Topics Concern  . Not on file  Social History Narrative   Active, walks regularly.     BP (!) 160/82   Pulse 78   Ht 5\' 7"  (1.702 m)   Wt 138 lb (62.6 kg)   SpO2 96%   BMI 21.61 kg/m   Physical Exam:  Well appearing NAD HEENT: Unremarkable Neck:  No JVD, no thyromegally Lymphatics:  No adenopathy Back:  No CVA tenderness Lungs:  Clear with no wheezes HEART:  Regular rate rhythm, no murmurs, no rubs, no clicks Abd:  soft, positive bowel sounds, no organomegally, no rebound, no guarding Ext:  2 plus  pulses, no edema, no cyanosis, no clubbing Skin:  No rashes no nodules Neuro:  CN II through XII intact, motor grossly intact  EKG - nsr DEVICE  Normal device function.  See PaceArt for details.   Assess/Plan: 1. VT - he has had no recurrent ventricular arrhythmias. He will continue his current meds. 2. Chronic systolic heart failure - he is very sedentary due to his c-spine issues. He will continue his current meds. 3. ICD - his medtronic single chamber ICD is working normally. We will recheck in several months.  Mikle Bosworth.D.

## 2019-06-23 NOTE — Patient Instructions (Addendum)
Medication Instructions:  Your physician recommends that you continue on your current medications as directed. Please refer to the Current Medication list given to you today.  A prescription has been sent to your pharmacy for Nystatin powder.  Labwork: None ordered.  Testing/Procedures: None ordered.  Follow-Up: Your physician wants you to follow-up in: one year with Dr. Lovena Le.   You will receive a reminder letter in the mail two months in advance. If you don't receive a letter, please call our office to schedule the follow-up appointment.  Remote monitoring is used to monitor your ICD from home. This monitoring reduces the number of office visits required to check your device to one time per year. It allows [name] to keep an eye on the functioning of your device to ensure it is working properly. You are scheduled for a device check from home on 08/28/2019. You may send your transmission at any time that day. If you have a wireless device, the transmission will be sent automatically. After your physician reviews your transmission, you will receive a postcard with your next transmission date.  Any Other Special Instructions Will Be Listed Below (If Applicable).  If you need a refill on your cardiac medications before your next appointment, please call your pharmacy.

## 2019-07-13 NOTE — Progress Notes (Signed)
HPI: FU coronary artery disease. Abdominal CT in 2005 showed no aneurysm. Patient had a myocardial infarction in 2004. He had PCI of his LAD. His ejection fraction was 30%. Last catheterization in 2007 showed patent stents in the LAD. There is an 80% ostial first diagonal, occluded second diagonal and normal circumflex and right coronary artery. Ejection fraction was 30%. Patient had an ICD placed. Last echocardiogram in December 2012 showed an ejection fraction of 30-35%. There was grade 1 diastolic dysfunction. There was mild left atrial enlargement. Nuclear study May 2015 showed ejection fraction 34%. There was scar with mild peri-infarct ischemia. He has been treated medically. Since he was last seen,he occasionally has chest pain that he takes nitroglycerin for which is unchanged compared to past.  He does not have exertional chest pain.  He denies increased dyspnea or syncope.  Current Outpatient Medications  Medication Sig Dispense Refill  . aspirin 81 MG EC tablet Take 81 mg by mouth daily.      Marland Kitchen atorvastatin (LIPITOR) 40 MG tablet TAKE 1 TABLET BY MOUTH DAILY 90 tablet 0  . calcium carbonate (OS-CAL) 600 MG TABS Take 600 mg by mouth daily.     . carvedilol (COREG) 6.25 MG tablet TAKE 1 TABLET(6.25 MG) BY MOUTH TWICE DAILY WITH A MEAL 180 tablet 0  . cholecalciferol (VITAMIN D) 1000 UNITS tablet Take 1,000 Units by mouth daily.     . furosemide (LASIX) 20 MG tablet One po every other day for dependent edema 30 tablet 3  . levothyroxine (SYNTHROID, LEVOTHROID) 50 MCG tablet Take 1 tablet (50 mcg total) by mouth every morning. 90 tablet 3  . mupirocin ointment (BACTROBAN) 2 % Apply to abrasion twice a day 22 g 0  . nitroGLYCERIN (NITROSTAT) 0.4 MG SL tablet PLACE 1 TABLET UNDER THE TONGUE IF NEEDED EVERY 5 MINUTES FOR CHEST PAIN FOR 3 DOSES IF NO RELIEF AFTER FIRST DOSE CALL PRESCRIBER OR 911 25 tablet 6  . nystatin (MYCOSTATIN/NYSTOP) powder Apply topically 4 (four) times daily. 15 g  0  . omeprazole (PRILOSEC) 20 MG capsule Take 1 capsule (20 mg total) by mouth daily as needed (heartburn). 30 capsule 2  . ramipril (ALTACE) 5 MG capsule TAKE 1 CAPSULE DAILY 90 capsule 0  . spironolactone (ALDACTONE) 25 MG tablet Take 0.5 tablets (12.5 mg total) by mouth daily. 45 tablet 3   No current facility-administered medications for this visit.      Past Medical History:  Diagnosis Date  . Anxiety   . Atrial fibrillation ([place])   . Cardiac defibrillator in situ    s/p MDT Maximo VR single lead ICD - 07/06/2006  . 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  . Degenerative joint disease   . Depression   . H/O hiatal hernia    times 3  . History of orthostatic hypotension   . Hyperlipidemia   . Hypertension   . Hypothyroidism   . Ischemic cardiomyopathy    EF 40% 12/2008 V gram;  Echocardiogram 11/08/11: EF 30-35%, inferoseptal and anteroseptal, apical septal/lateral/anterior/inferior and true apical akinesis, no LV thrombus, grade 1 diastolic dysfunction.  . Lumbar spondylosis    s/p L5-S1 fusion  12/11/2009  . Premature atrial contractions   . Premature ventricular contractions   . Skin cancer    skin  . Systolic CHF, chronic ([place])     Past Surgical History:  Procedure Laterality Date  . BACK SURGERY     x 2  . EP IMPLANTABLE DEVICE [name] 04/25/2015   Procedure: ICD Generator Changeout;  Surgeon: Evans Lance, MD;  Location: [facility];  Service: Cardiovascular;  Laterality: [name];  . icd     s/p MDT Maximo VR single lead ICD - 07/06/2006  . INGUINAL HERNIA REPAIR Left    x 3  . LEFT HEART CATHETERIZATION WITH CORONARY ANGIOGRAM [name] 11/09/2011   Procedure: LEFT HEART  CATHETERIZATION WITH CORONARY ANGIOGRAM;  Surgeon: Josue Hector, MD;  Location: [facility];  Service: Cardiovascular;  Laterality: [name];  . LUMBAR FUSION     s/p L5-S1 fusion 12/11/2009  . MOHS SURGERY     x 3, head nose  . SKIN CANCER EXCISION      Social History   Socioeconomic History  . Marital status: Married    Spouse name: Not on file  . Number of children: 2  . Years of education: Not on file  . Highest education level: Not on file  Occupational History  . Occupation: retired    Fish farm manager: [name]  Social Needs  . Financial resource strain: Not on file  . Food insecurity    Worry: Not on file    Inability: Not on file  . Transportation needs    Medical: Not on file    Non-medical: Not on file  Tobacco Use  . Smoking status: Never Smoker  . Smokeless tobacco: Never Used  Substance and Sexual Activity  . Alcohol use: No  . Drug use: No  . Sexual activity: Never  Lifestyle  . Physical activity    Days per week: Not on file    Minutes per session: Not on file  . Stress: Not on file  Relationships  . Social Herbalist on phone: Not on file    Gets together: Not on file    Attends religious service: Not on file    Active member of club or organization: Not on file    Attends meetings of clubs or organizations: Not on file    Relationship status: Not on file  . Intimate partner violence    Fear of current or ex partner: Not on file    Emotionally abused: Not on file    Physically abused: Not on file    Forced sexual activity: Not on file  Other Topics Concern  . Not on file  Social History Narrative   Active, walks regularly.    Family History  Problem Relation Age of Onset  . Peripheral vascular disease Father        deceased 40  . Pneumonia Mother        deceased 48  . Prostate cancer Brother   . Diabetes Brother   . Heart disease Brother   . Cancer Brother        unknown type    ROS: Neck pain but no fevers or chills,  productive cough, hemoptysis, dysphasia, odynophagia, melena, hematochezia, dysuria, hematuria, rash, seizure activity, orthopnea, PND, pedal edema, claudication. Remaining systems are negative.  Physical Exam: Well-developed well-nourished in no acute distress.  Skin is warm and dry.  HEENT is normal.  Neck chronic flexion unable to lift head. Chest is clear  to auscultation with normal expansion.  Cardiovascular exam is regular rate and rhythm.  Abdominal exam nontender or distended. No masses palpated. Extremities show no edema. neuro grossly intact   A/P  1 coronary artery disease-patient denies recurrent chest pain or dyspnea.  Continue aspirin and statin.  2 ischemic cardiomyopathy-continue ACE inhibitor and beta-blocker.  3 chronic systolic congestive heart failure-patient doing well from a symptomatic standpoint.  Continue present dose of diuretic.  Continue fluid restriction and low-sodium diet.  4 prior ICD-followed by Dr. Lovena Le.  5 hypertension-patient's blood pressure is elevated but he states typically controlled at home.  We will continue present medications and follow.  6 hyperlipidemia-continue statin.  Kirk Ruths, MD

## 2019-07-17 ENCOUNTER — Ambulatory Visit (INDEPENDENT_AMBULATORY_CARE_PROVIDER_SITE_OTHER): Payer: Medicare Other | Admitting: Cardiology

## 2019-07-17 ENCOUNTER — Encounter: Payer: Self-pay | Admitting: Cardiology

## 2019-07-17 ENCOUNTER — Other Ambulatory Visit: Payer: Self-pay

## 2019-07-17 VITALS — BP 150/80 | HR 63 | Temp 98.4°F | Ht 67.5 in | Wt 140.0 lb

## 2019-07-17 DIAGNOSIS — I255 Ischemic cardiomyopathy: Secondary | ICD-10-CM | POA: Diagnosis not present

## 2019-07-17 DIAGNOSIS — I10 Essential (primary) hypertension: Secondary | ICD-10-CM

## 2019-07-17 DIAGNOSIS — I2510 Atherosclerotic heart disease of native coronary artery without angina pectoris: Secondary | ICD-10-CM

## 2019-07-17 DIAGNOSIS — E7800 Pure hypercholesterolemia, unspecified: Secondary | ICD-10-CM

## 2019-07-17 NOTE — Patient Instructions (Signed)

## 2019-07-26 ENCOUNTER — Ambulatory Visit (INDEPENDENT_AMBULATORY_CARE_PROVIDER_SITE_OTHER): Payer: Medicare Other | Admitting: Podiatry

## 2019-07-26 ENCOUNTER — Encounter: Payer: Self-pay | Admitting: Podiatry

## 2019-07-26 ENCOUNTER — Other Ambulatory Visit: Payer: Self-pay

## 2019-07-26 DIAGNOSIS — B351 Tinea unguium: Secondary | ICD-10-CM | POA: Diagnosis not present

## 2019-07-26 DIAGNOSIS — M79674 Pain in right toe(s): Secondary | ICD-10-CM

## 2019-07-26 DIAGNOSIS — W450XXA Nail entering through skin, initial encounter: Secondary | ICD-10-CM

## 2019-07-26 DIAGNOSIS — M79675 Pain in left toe(s): Secondary | ICD-10-CM

## 2019-07-26 NOTE — Progress Notes (Signed)
Complaint:  Visit Type: Patient returns to my office for continued preventative foot care services. Complaint: Patient states" my nails have grown long and thick and become painful to walk and wear shoes" The patient presents for preventative foot care services. No changes to ROS  Podiatric Exam: Vascular: dorsalis pedis and posterior tibial pulses are palpable bilateral. Capillary return is immediate. Temperature gradient is WNL. Skin turgor WNL  Sensorium: Normal Semmes Weinstein monofilament test. Normal tactile sensation bilaterally. Nail Exam: Pt has thick disfigured discolored nails with subungual debris noted bilateral entire nail hallux through fifth toenails.  Subungual hematoma second nail left foot.  Right hallux nail plate is unattached from nail bed.  No redness or swelling or drainage. Ulcer Exam: There is no evidence of ulcer or pre-ulcerative changes or infection. Orthopedic Exam: Muscle tone and strength are WNL. No limitations in general ROM. No crepitus or effusions noted. Foot type and digits show no abnormalities. Bony prominences are unremarkable. Skin: No Porokeratosis. No infection or ulcers  Diagnosis:  Onychomycosis, , Pain in right toe, pain in left toes Nail Injury right hallux.  Treatment & Plan Procedures and Treatment: Consent by patient was obtained for treatment procedures.   Debridement of mycotic and hypertrophic toenails, 1 through 5 bilateral and clearing of subungual debris. No ulceration, no infection noted. While debriding the nails  B/L, the right hallux was noted to be unattached from nail bed.  The nail plate was removed and the nail bed was bandaged with neosporin/DSD.  Home soak instructions were given to wife of patient.  Call office if problem occurs. Return Visit-Office Procedure: Patient instructed to return to the office for a follow up visit 3 months for continued evaluation and treatment.    Gardiner Barefoot DPM

## 2019-07-31 ENCOUNTER — Other Ambulatory Visit: Payer: Self-pay | Admitting: Cardiology

## 2019-07-31 ENCOUNTER — Other Ambulatory Visit: Payer: Self-pay | Admitting: Internal Medicine

## 2019-08-12 ENCOUNTER — Other Ambulatory Visit: Payer: Self-pay | Admitting: Internal Medicine

## 2019-08-16 ENCOUNTER — Other Ambulatory Visit: Payer: Self-pay | Admitting: Cardiology

## 2019-08-28 ENCOUNTER — Ambulatory Visit (INDEPENDENT_AMBULATORY_CARE_PROVIDER_SITE_OTHER): Payer: Medicare Other | Admitting: *Deleted

## 2019-08-28 DIAGNOSIS — I472 Ventricular tachycardia: Secondary | ICD-10-CM

## 2019-08-28 DIAGNOSIS — I255 Ischemic cardiomyopathy: Secondary | ICD-10-CM | POA: Diagnosis not present

## 2019-08-28 DIAGNOSIS — I4729 Other ventricular tachycardia: Secondary | ICD-10-CM

## 2019-08-29 LAB — CUP PACEART REMOTE DEVICE CHECK
Battery Remaining Longevity: 86 mo
Battery Voltage: 2.99 V
Brady Statistic RV Percent Paced: 0.37 %
Date Time Interrogation Session: 20201005083725
HighPow Impedance: 39 Ohm
HighPow Impedance: 54 Ohm
Implantable Lead Implant Date: 20070814
Implantable Lead Location: 753860
Implantable Lead Model: 6947
Implantable Pulse Generator Implant Date: 20160602
Lead Channel Impedance Value: 399 Ohm
Lead Channel Impedance Value: 456 Ohm
Lead Channel Pacing Threshold Amplitude: 1.125 V
Lead Channel Pacing Threshold Pulse Width: 0.4 ms
Lead Channel Sensing Intrinsic Amplitude: 22.625 mV
Lead Channel Sensing Intrinsic Amplitude: 22.625 mV
Lead Channel Setting Pacing Amplitude: 2.5 V
Lead Channel Setting Pacing Pulse Width: 0.4 ms
Lead Channel Setting Sensing Sensitivity: 0.3 mV

## 2019-09-05 NOTE — Progress Notes (Signed)
Remote ICD transmission.   

## 2019-10-25 ENCOUNTER — Ambulatory Visit: Payer: Medicare Other | Admitting: Podiatry

## 2019-10-30 ENCOUNTER — Other Ambulatory Visit: Payer: Self-pay | Admitting: Internal Medicine

## 2019-10-30 ENCOUNTER — Other Ambulatory Visit: Payer: Self-pay

## 2019-10-30 ENCOUNTER — Other Ambulatory Visit: Payer: Medicare Other | Admitting: Internal Medicine

## 2019-10-30 DIAGNOSIS — E7849 Other hyperlipidemia: Secondary | ICD-10-CM

## 2019-10-31 ENCOUNTER — Encounter: Payer: Self-pay | Admitting: Internal Medicine

## 2019-10-31 ENCOUNTER — Ambulatory Visit (INDEPENDENT_AMBULATORY_CARE_PROVIDER_SITE_OTHER): Payer: Medicare Other | Admitting: Internal Medicine

## 2019-10-31 VITALS — BP 170/80 | HR 62 | Temp 98.0°F | Ht 67.5 in | Wt 136.0 lb

## 2019-10-31 DIAGNOSIS — J22 Unspecified acute lower respiratory infection: Secondary | ICD-10-CM | POA: Diagnosis not present

## 2019-10-31 DIAGNOSIS — Z23 Encounter for immunization: Secondary | ICD-10-CM

## 2019-10-31 DIAGNOSIS — E039 Hypothyroidism, unspecified: Secondary | ICD-10-CM

## 2019-10-31 DIAGNOSIS — I255 Ischemic cardiomyopathy: Secondary | ICD-10-CM | POA: Diagnosis not present

## 2019-10-31 MED ORDER — LEVOTHYROXINE SODIUM 50 MCG PO TABS: 50.0000 ug | ORAL_TABLET | ORAL | 3 refills | Status: DC

## 2019-10-31 MED ORDER — AZITHROMYCIN 250 MG PO TABS: ORAL_TABLET | ORAL | 0 refills | Status: DC

## 2019-10-31 NOTE — Patient Instructions (Addendum)
Continue current medications and follow-up in 6 months.  Zithromax Z-PAK 2 p.o. day 1 followed by 1 p.o. days 2 through 5 for cough.  Watch weight.  Flu vaccine given.

## 2019-10-31 NOTE — Progress Notes (Signed)
   Subjective:    Patient ID: Corey Jordan, male    DOB: August 18, 1934, 85 y.o.   MRN: [id number]  HPI 85 year old Male for 6 month recheck. Complaining of cough. Will prescribe Z pak which has worked well before.  Bringing up white sputum.  No fever.  No change in thoracic kyphosis with chronic impairment of his neck which is constantly flexed to where he is looking at the floor constantly.  He no longer drives.  Has seen neurosurgeon and he is not a candidate for surgery for this issue due to cardiac issues.  No complaint of chest pain.  Followed closely by cardiology and has pacemaker.  History of ischemic cardiomyopathy.  Has internal cardiac defibrillator.  Staying at home.  Being study.  Unable to drive anymore due to issues with his neck.  History of hypothyroidism, hyperlipidemia, hypertension.  He has a history of office hypertension his blood pressure is elevated today at [id number] systolically.  History of GE reflux treated with PPI.  History of mild glucose intolerance.     Review of Systems  Balance is bad he says. Has not fallen in over a year.     Objective:   Physical Exam Blood pressure 170/90 repeated 170/80.  Weight is 136 pounds.  BMI 20.99.  He has lost 4 pounds since June 2020. Skin warm and dry.  Nodes none.  Neck is supple.  No carotid bruits.  Chest clear to auscultation without rales or wheezes noted.  Cardiac exam regular rate and rhythm.  Extremities without edema.  He looks thin and frail.  Thoracic kyphosis noted.  Neck constantly flexed      Assessment & Plan:  Cough-etiology unclear but will treat with Zithromax Z-PAK 2 p.o. day 1 followed by 1 p.o. days 2 through 5.  This has worked well for him in the past.  He complains that cough is productive of white sputum.  Four pound weight loss-continue to monitor.  Coronary artery disease followed closely by cardiology.  He also has an internal cardiac defibrillator.  Hypothyroidism treated with thyroid  replacement medication.  Need to add TSH to labs drawn yesterday.  Last TSH was December 2019 and was normal.  Lipid management-lipid panel normal.  History of impaired glucose tolerance-treated with diet  Plan: Add TSH to labs from yesterday.  Continue current medications and come back in 6 months for health maintenance exam and Medicare wellness visit.  He will get flu vaccine today.  Zithromax Z-PAK 2 p.o. day 1 followed by 1 p.o. days 2 through 5 for cough.

## 2019-11-02 LAB — LIPID PANEL
Cholesterol: 146 mg/dL (ref <200)
HDL: 75 mg/dL (ref >=40)
LDL Cholesterol (Calc): 57 mg/dL (calc)
Non-HDL Cholesterol (Calc): 71 mg/dL (calc) (ref <130)
Total CHOL/HDL Ratio: 1.9 (calc) (ref <5.0)
Triglycerides: 48 mg/dL (ref <150)

## 2019-11-02 LAB — TSH: TSH: 2 mIU/L (ref 0.40–4.50)

## 2019-11-02 LAB — TEST AUTHORIZATION

## 2019-11-26 ENCOUNTER — Other Ambulatory Visit: Payer: Self-pay | Admitting: Internal Medicine

## 2019-11-27 ENCOUNTER — Ambulatory Visit (INDEPENDENT_AMBULATORY_CARE_PROVIDER_SITE_OTHER): Payer: Medicare Other | Admitting: *Deleted

## 2019-11-27 DIAGNOSIS — I255 Ischemic cardiomyopathy: Secondary | ICD-10-CM

## 2019-11-27 LAB — CUP PACEART REMOTE DEVICE CHECK
Battery Remaining Longevity: 83 mo
Battery Voltage: 2.99 V
Brady Statistic RV Percent Paced: 0.51 %
Date Time Interrogation Session: 20210104012203
HighPow Impedance: 40 Ohm
HighPow Impedance: 59 Ohm
Implantable Lead Implant Date: 20070814
Implantable Lead Location: 753860
Implantable Lead Model: 6947
Implantable Pulse Generator Implant Date: 20160602
Lead Channel Impedance Value: 399 Ohm
Lead Channel Impedance Value: 475 Ohm
Lead Channel Pacing Threshold Amplitude: 1.125 V
Lead Channel Pacing Threshold Pulse Width: 0.4 ms
Lead Channel Sensing Intrinsic Amplitude: 24.375 mV
Lead Channel Sensing Intrinsic Amplitude: 24.375 mV
Lead Channel Setting Pacing Amplitude: 2.5 V
Lead Channel Setting Pacing Pulse Width: 0.4 ms
Lead Channel Setting Sensing Sensitivity: 0.3 mV

## 2020-01-23 ENCOUNTER — Other Ambulatory Visit: Payer: Self-pay

## 2020-01-23 ENCOUNTER — Ambulatory Visit (INDEPENDENT_AMBULATORY_CARE_PROVIDER_SITE_OTHER): Payer: Medicare Other | Admitting: Podiatry

## 2020-01-23 ENCOUNTER — Encounter: Payer: Self-pay | Admitting: Podiatry

## 2020-01-23 DIAGNOSIS — M79675 Pain in left toe(s): Secondary | ICD-10-CM | POA: Diagnosis not present

## 2020-01-23 DIAGNOSIS — M79674 Pain in right toe(s): Secondary | ICD-10-CM

## 2020-01-23 DIAGNOSIS — L84 Corns and callosities: Secondary | ICD-10-CM | POA: Insufficient documentation

## 2020-01-23 DIAGNOSIS — B351 Tinea unguium: Secondary | ICD-10-CM | POA: Diagnosis not present

## 2020-01-23 NOTE — Progress Notes (Signed)
Complaint:  Visit Type: Patient returns to my office for continued preventative foot care services. Complaint: Patient states" my nails have grown long and thick and become painful to walk and wear shoes" .  He has developed a painful callus under his big toe joint left foot.  The patient presents for preventative foot care services. No changes to ROS  Podiatric Exam: Vascular: dorsalis pedis and posterior tibial pulses are palpable bilateral. Capillary return is immediate. Temperature gradient is WNL. Skin turgor WNL  Sensorium: Normal Semmes Weinstein monofilament test. Normal tactile sensation bilaterally. Nail Exam: Pt has thick disfigured discolored nails with subungual debris noted bilateral entire nail hallux through fifth toenails.     Blister at proximal nail fold second toe left foot.   Ulcer Exam: There is no evidence of ulcer or pre-ulcerative changes or infection. Orthopedic Exam: Muscle tone and strength are WNL. No limitations in general ROM. No crepitus or effusions noted. Foot type and digits show no abnormalities. Bony prominences are unremarkable. Skin: No Porokeratosis. No infection or ulcers.  Callus sub 1 left foot.  Diagnosis:  Onychomycosis, , Pain in right toe, pain in left toes  Callus sub 1 left foot.  Treatment & Plan Procedures and Treatment: Consent by patient was obtained for treatment procedures.   Debridement of mycotic and hypertrophic toenails, 1 through 5 bilateral and clearing of subungual debris. No ulceration, no infections.  Debride callus sub 1 left foot. Return Visit-Office Procedure: Patient instructed to return to the office for a follow up visit 3 months for continued evaluation and treatment. Told him he had nail problems last vist, so I emphasized a 3 month appointment.   Gardiner Barefoot DPM

## 2020-02-26 ENCOUNTER — Ambulatory Visit (INDEPENDENT_AMBULATORY_CARE_PROVIDER_SITE_OTHER): Payer: Medicare Other | Admitting: *Deleted

## 2020-02-26 DIAGNOSIS — I255 Ischemic cardiomyopathy: Secondary | ICD-10-CM

## 2020-02-26 LAB — CUP PACEART REMOTE DEVICE CHECK
Battery Remaining Longevity: 78 mo
Battery Voltage: 2.99 V
Brady Statistic RV Percent Paced: 0.08 %
Date Time Interrogation Session: 20210405043823
HighPow Impedance: 38 Ohm
HighPow Impedance: 53 Ohm
Implantable Lead Implant Date: 20070814
Implantable Lead Location: 753860
Implantable Lead Model: 6947
Implantable Pulse Generator Implant Date: 20160602
Lead Channel Impedance Value: 361 Ohm
Lead Channel Impedance Value: 418 Ohm
Lead Channel Pacing Threshold Amplitude: 1 V
Lead Channel Pacing Threshold Pulse Width: 0.4 ms
Lead Channel Sensing Intrinsic Amplitude: 22.125 mV
Lead Channel Sensing Intrinsic Amplitude: 22.125 mV
Lead Channel Setting Pacing Amplitude: 2.5 V
Lead Channel Setting Pacing Pulse Width: 0.4 ms
Lead Channel Setting Sensing Sensitivity: 0.3 mV

## 2020-02-27 NOTE — Progress Notes (Signed)
ICD Remote  

## 2020-04-24 ENCOUNTER — Ambulatory Visit (INDEPENDENT_AMBULATORY_CARE_PROVIDER_SITE_OTHER): Payer: Medicare Other | Admitting: Podiatry

## 2020-04-24 ENCOUNTER — Encounter: Payer: Self-pay | Admitting: Podiatry

## 2020-04-24 ENCOUNTER — Other Ambulatory Visit: Payer: Self-pay

## 2020-04-24 VITALS — Temp 98.4°F

## 2020-04-24 DIAGNOSIS — B351 Tinea unguium: Secondary | ICD-10-CM

## 2020-04-24 DIAGNOSIS — M79675 Pain in left toe(s): Secondary | ICD-10-CM

## 2020-04-24 DIAGNOSIS — L84 Corns and callosities: Secondary | ICD-10-CM

## 2020-04-24 DIAGNOSIS — M79674 Pain in right toe(s): Secondary | ICD-10-CM | POA: Diagnosis not present

## 2020-04-24 NOTE — Progress Notes (Signed)
This patient returns to the office for evaluation and treatment of long thick painful nails .  This patient is unable to trim his own nails since the patient cannot reach his feet.  Patient says the nails are painful walking and wearing his shoes.  He says he has pain in the callus under his big toe joint left foot.  He returns for preventive foot care services.  General Appearance  Alert, conversant and in no acute stress.  Vascular  Dorsalis pedis and posterior tibial  pulses are palpable  bilaterally.  Capillary return is within normal limits  bilaterally. Temperature is within normal limits  bilaterally.  Neurologic  Senn-Weinstein monofilament wire test within normal limits  bilaterally. Muscle power within normal limits bilaterally.  Nails Thick disfigured discolored nails with subungual debris  from hallux to fifth toes bilaterally. No evidence of bacterial infection or drainage bilaterally.  Subungual hematoma dried second toe left foot.  Third toenmail right foot is distally unattached to nail bed third toe right foot.  Orthopedic  No limitations of motion  feet .  No crepitus or effusions noted.  No bony pathology or digital deformities noted.  Skin  normotropic skin with no porokeratosis noted bilaterally.  No signs of infections or ulcers noted.   Callus sub 1 left foot.  Onychomycosis  Pain in toes right foot  Pain in toes left foot  Porokeratosis sub 1 left foot.  Debridement  of nails  1-5  B/L with a nail nipper.  Nails were then filed using a dremel tool with no incidents.  Nail was trimmed using nail nipper third toe right and bandaged with neosporin/DSD.  Call the office if problem occurs.  RTC  3 months.   Gardiner Barefoot DPM

## 2020-04-26 ENCOUNTER — Other Ambulatory Visit: Payer: Medicare Other | Admitting: Internal Medicine

## 2020-04-29 ENCOUNTER — Encounter: Payer: Medicare Other | Admitting: Internal Medicine

## 2020-05-28 ENCOUNTER — Ambulatory Visit (INDEPENDENT_AMBULATORY_CARE_PROVIDER_SITE_OTHER): Payer: Medicare Other | Admitting: *Deleted

## 2020-05-28 DIAGNOSIS — I255 Ischemic cardiomyopathy: Secondary | ICD-10-CM | POA: Diagnosis not present

## 2020-05-28 LAB — CUP PACEART REMOTE DEVICE CHECK
Battery Remaining Longevity: 58 mo
Battery Voltage: 2.99 V
Brady Statistic RV Percent Paced: 0.08 %
Date Time Interrogation Session: 20210706012304
HighPow Impedance: 43 Ohm
HighPow Impedance: 67 Ohm
Implantable Lead Implant Date: 20070814
Implantable Lead Location: 753860
Implantable Lead Model: 6947
Implantable Pulse Generator Implant Date: 20160602
Lead Channel Impedance Value: 418 Ohm
Lead Channel Impedance Value: 589 Ohm
Lead Channel Pacing Threshold Amplitude: 1 V
Lead Channel Pacing Threshold Pulse Width: 0.4 ms
Lead Channel Sensing Intrinsic Amplitude: 20.875 mV
Lead Channel Sensing Intrinsic Amplitude: 20.875 mV
Lead Channel Setting Pacing Amplitude: 2.5 V
Lead Channel Setting Pacing Pulse Width: 0.4 ms
Lead Channel Setting Sensing Sensitivity: 0.3 mV

## 2020-05-29 NOTE — Progress Notes (Signed)
Remote ICD transmission.   

## 2020-06-17 ENCOUNTER — Ambulatory Visit: Payer: Medicare Other | Attending: Internal Medicine

## 2020-06-17 DIAGNOSIS — Z23 Encounter for immunization: Secondary | ICD-10-CM

## 2020-06-17 NOTE — Progress Notes (Signed)
° °  Covid-19 Vaccination Clinic  Name:  BERTRAN ZEIMET    MRN: [id number] DOB: 1934/07/21  06/17/2020  Mr. Tunney was observed post Covid-19 immunization for 15 minutes without incident. He was provided with Vaccine Information Sheet and instruction to access the V-Safe system.   Mr. Yearby was instructed to call 911 with any severe reactions post vaccine:  Difficulty breathing   Swelling of face and throat   A fast heartbeat   A bad rash all over body   Dizziness and weakness   Immunizations Administered    Name Date Dose VIS Date Route   JANSSEN COVID-19 VACCINE 06/17/2020 10:17 AM 0.5 mL 01/20/2020 Intramuscular   Manufacturer: [employer]a [employer]   Lot: [id number]   [place]: [id number]

## 2020-07-02 ENCOUNTER — Telehealth (INDEPENDENT_AMBULATORY_CARE_PROVIDER_SITE_OTHER): Payer: Medicare Other | Admitting: Internal Medicine

## 2020-07-02 DIAGNOSIS — E7849 Other hyperlipidemia: Secondary | ICD-10-CM | POA: Diagnosis not present

## 2020-07-02 DIAGNOSIS — I10 Essential (primary) hypertension: Secondary | ICD-10-CM

## 2020-07-02 DIAGNOSIS — Z7689 Persons encountering health services in other specified circumstances: Secondary | ICD-10-CM | POA: Diagnosis not present

## 2020-07-02 DIAGNOSIS — R5383 Other fatigue: Secondary | ICD-10-CM | POA: Diagnosis not present

## 2020-07-02 DIAGNOSIS — E039 Hypothyroidism, unspecified: Secondary | ICD-10-CM

## 2020-07-02 DIAGNOSIS — M5030 Other cervical disc degeneration, unspecified cervical region: Secondary | ICD-10-CM
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
## 2020-07-04 ENCOUNTER — Other Ambulatory Visit: Payer: Medicare Other

## 2020-07-10 ENCOUNTER — Other Ambulatory Visit: Payer: Self-pay

## 2020-07-10 ENCOUNTER — Ambulatory Visit (INDEPENDENT_AMBULATORY_CARE_PROVIDER_SITE_OTHER): Payer: Medicare Other | Admitting: Otolaryngology

## 2020-07-10 DIAGNOSIS — I255 Ischemic cardiomyopathy: Secondary | ICD-10-CM

## 2020-07-10 DIAGNOSIS — H6123 Impacted cerumen, bilateral: Secondary | ICD-10-CM | POA: Diagnosis not present

## 2020-07-10 NOTE — Progress Notes (Signed)
HPI: Corey Jordan is a 86 y.o. male who presents for evaluation of wax buildup in both ears right side worse than left.  He previously followed up with Dr. Ernesto Rutherford about every 4 to 6 months to have his ears cleaned but last had it cleaned about a year and a half ago..  Past Medical History:  Diagnosis Date  . Anxiety   . Atrial fibrillation ([place])   . Cardiac defibrillator in situ    s/p MDT Maximo VR single lead ICD - 07/06/2006  . 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  . Degenerative joint disease   . DEGENERATIVE JOINT DISEASE 04/09/2009   Qualifier: Diagnosis of  By: Darrick Meigs    . Depression   . GE reflux 09/19/2011  . H/O hiatal hernia    times 3  . History of orthostatic hypotension   . Hyperlipidemia   . Hypertension   . Hypothyroidism   . Ischemic cardiomyopathy    EF 40% 12/2008 V gram;  Echocardiogram 11/08/11: EF 30-35%, inferoseptal and anteroseptal, apical septal/lateral/anterior/inferior and true apical akinesis, no LV thrombus, grade 1 diastolic dysfunction.  . Lumbar disc disease 09/19/2011  . Lumbar spondylosis    s/p L5-S1 fusion 12/11/2009  . NSVT (nonsustained ventricular tachycardia) ([place]) 06/23/2019  . Premature atrial contractions   . Premature ventricular contractions   . Skin cancer    skin  . Systolic CHF, chronic ([place])    Past Surgical History:  Procedure Laterality Date  . BACK SURGERY     x 2  . EP IMPLANTABLE DEVICE [name] 04/25/2015   Procedure: ICD Generator Changeout;  Surgeon: Evans Lance, MD;  Location: [facility];  Service: Cardiovascular;  Laterality: [name];  . icd     s/p MDT Maximo VR single lead ICD - 07/06/2006  . INGUINAL HERNIA REPAIR  Left    x 3  . LEFT HEART CATHETERIZATION WITH CORONARY ANGIOGRAM [name] 11/09/2011   Procedure: LEFT HEART CATHETERIZATION WITH CORONARY ANGIOGRAM;  Surgeon: Josue Hector, MD;  Location: [facility] CATH LAB;  Service: Cardiovascular;  Laterality: [name];  . LUMBAR FUSION     s/p L5-S1 fusion 12/11/2009  . MOHS SURGERY     x 3, head nose  . SKIN CANCER EXCISION     Social History   Socioeconomic History  . Marital status: Married    Spouse name: Not on file  . Number of children: 2  . Years of education: Not on file  . Highest education level: Not on file  Occupational History  . Occupation: retired    Fish farm manager: [name]  Tobacco Use  . Smoking status: Never Smoker  . Smokeless tobacco: Never Used  Vaping Use  . Vaping Use: Never used  Substance and Sexual Activity  . Alcohol use: No  . Drug use: No  . Sexual activity: Never  Other Topics Concern  . Not on file  Social History Narrative   Active, walks regularly.   Social Determinants of Health   Financial Resource Strain:   . Difficulty of Paying Living Expenses:   Food Insecurity:   . Worried About Charity fundraiser in the Last Year:   .  Ran Out of Food in the Last Year:   Transportation Needs:   . Film/video editor (Medical):   Marland Kitchen Lack of Transportation (Non-Medical):   Physical Activity:   . Days of Exercise per Week:   . Minutes of Exercise per Session:   Stress:   . Feeling of Stress :   Social Connections:   . Frequency of Communication with Friends and Family:   . Frequency of Social Gatherings with Friends and Family:   . Attends Religious Services:   . Active Member of Clubs or Organizations:   . Attends Archivist Meetings:   Marland Kitchen Marital Status:    Family History  Problem Relation Age of Onset  . Peripheral vascular disease Father        deceased 30  . Pneumonia Mother        deceased 72  . Prostate cancer Brother   . Diabetes Brother   . Heart disease Brother   . Cancer Brother         unknown type   Allergies  Allergen Reactions  . Pollen Extract Other (See Comments)    Sneezing and watery eyes   Prior to Admission medications   Medication Sig Start Date End Date Taking? Authorizing Provider  aspirin 81 MG EC tablet Take 81 mg by mouth daily.      [provider]  atorvastatin (LIPITOR) 40 MG tablet TAKE 1 TABLET BY MOUTH DAILY 08/02/19   Lelon Perla, MD  calcium carbonate (OS-CAL) 600 MG TABS Take 600 mg by mouth daily.     [provider]  carvedilol (COREG) 6.25 MG tablet TAKE 1 TABLET(6.25 MG) BY MOUTH TWICE DAILY WITH A MEAL 10/31/19   Evans Lance, MD  cholecalciferol (VITAMIN D) 1000 UNITS tablet Take 1,000 Units by mouth daily.     [provider]  levothyroxine (SYNTHROID) 50 MCG tablet TAKE 1 TABLET(50 MCG) BY MOUTH EVERY MORNING 11/26/19   [facility], Cresenciano Lick, MD  nitroGLYCERIN (NITROSTAT) 0.4 MG SL tablet PLACE 1 TABLET UNDER THE TONGUE IF NEEDED EVERY 5 MINUTES FOR CHEST PAIN FOR 3 DOSES IF NO RELIEF AFTER FIRST DOSE CALL PRESCRIBER OR 911 Patient not taking: Reported on 07/02/2020 11/14/18   Lelon Perla, MD  ramipril (ALTACE) 5 MG capsule TAKE 1 CAPSULE DAILY 08/16/19   Lelon Perla, MD  spironolactone (ALDACTONE) 25 MG tablet TAKE 1/2 TABLET (=12.5MG    TOTAL) DAILY 08/15/19   Evans Lance, MD     Positive ROS: Otherwise negative  All other systems have been reviewed and were otherwise negative with the exception of those mentioned in the HPI and as above.  Physical Exam: Constitutional: Alert, well-appearing, no acute distress Ears: External ears without lesions or tenderness. Ear canals with a large amount of hairs in both ear canals.  Both ear canals were completely occluded with cerumen that was removed with curettes suction and forceps right side was worse than left.  After removing the cerumen he had mild inflammatory changes and clear TMs otherwise.. Nasal: External nose without lesions. Clear nasal  passages Oral: Oropharynx clear. Neck: No palpable adenopathy or masses Respiratory: Breathing comfortably  Skin: No facial/neck lesions or rash noted.  Cerumen impaction removal  Date/Time: 07/10/2020 12:36 PM Performed by: Rozetta Nunnery, MD Authorized by: Rozetta Nunnery, MD   Consent:    Consent obtained:  Verbal   Consent given by:  Patient   Risks discussed:  Pain and bleeding Procedure details:  Location:  L ear and R ear   Procedure type: curette, suction and forceps   Post-procedure details:    Inspection:  TM intact and canal normal   Hearing quality:  Improved   Patient tolerance of procedure:  Tolerated well, no immediate complications Comments:     TMs are clear bilaterally.  Ear canals are little irregular and patient is very kyphotic making cleaning the ear canals little difficult.    Assessment: Bilateral cerumen impactions  Plan: This was cleaned in the office today.  TMs were clear bilaterally. He will follow-up in 6 months for recheck and cleaning.  Radene Journey, MD

## 2020-07-12 ENCOUNTER — Telehealth: Payer: Self-pay | Admitting: Internal Medicine

## 2020-07-12 NOTE — Telephone Encounter (Signed)
New message:   Patient son calling staying that some call about VV. I did not see a order. patient states patient need to be seen in the office. Please call back.

## 2020-07-12 NOTE — Telephone Encounter (Signed)
Returned call to son.  Advised all Dr. Tanna Furry visits are in person.

## 2020-07-15 ENCOUNTER — Ambulatory Visit (INDEPENDENT_AMBULATORY_CARE_PROVIDER_SITE_OTHER): Payer: Medicare Other | Admitting: Internal Medicine

## 2020-07-15 ENCOUNTER — Other Ambulatory Visit: Payer: Self-pay

## 2020-07-15 VITALS — BP 130/74 | HR 67 | Ht 67.5 in | Wt 135.0 lb

## 2020-07-15 DIAGNOSIS — I472 Ventricular tachycardia: Secondary | ICD-10-CM | POA: Diagnosis not present

## 2020-07-15 DIAGNOSIS — I2510 Atherosclerotic heart disease of native coronary artery without angina pectoris: Secondary | ICD-10-CM

## 2020-07-15 DIAGNOSIS — I5022 Chronic systolic (congestive) heart failure: Secondary | ICD-10-CM | POA: Diagnosis not present

## 2020-07-15 DIAGNOSIS — I255 Ischemic cardiomyopathy: Secondary | ICD-10-CM | POA: Diagnosis not present

## 2020-07-15 DIAGNOSIS — I4729 Other ventricular tachycardia: Secondary | ICD-10-CM

## 2020-07-15 DIAGNOSIS — Z95810 Presence of automatic (implantable) cardiac defibrillator: Secondary | ICD-10-CM | POA: Diagnosis not present

## 2020-07-15 NOTE — Progress Notes (Signed)
HPI Corey Jordan returns today for ongoing ICD evaluation.  He is a pleasant 86 year old man with a history of chronic systolic heart failure, and ischemic cardiomyopathy, sustained monomorphic ventricular tachycardia, status post ICD insertion.  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  The patient denies chest pain or shortness of breath.  No peripheral edema.  He has not had syncope. Allergies  Allergen Reactions  . Pollen Extract Other (See Comments)    Sneezing and watery eyes     Current Outpatient Medications  Medication Sig Dispense Refill  . aspirin 81 MG EC tablet Take 81 mg by mouth daily.      Marland Kitchen atorvastatin (LIPITOR) 40 MG tablet TAKE 1 TABLET BY MOUTH DAILY 90 tablet 3  . calcium carbonate (OS-CAL) 600 MG TABS Take 600 mg by mouth daily.     . carvedilol (COREG) 6.25 MG tablet TAKE 1 TABLET(6.25 MG) BY MOUTH TWICE DAILY WITH A MEAL 180 tablet 2  . cholecalciferol (VITAMIN D) 1000 UNITS tablet Take 1,000 Units by mouth daily.     Marland Kitchen levothyroxine (SYNTHROID) 50 MCG tablet TAKE 1 TABLET(50 MCG) BY MOUTH EVERY MORNING 90 tablet 3  . nitroGLYCERIN (NITROSTAT) 0.4 MG SL tablet PLACE 1 TABLET UNDER THE TONGUE IF NEEDED EVERY 5 MINUTES FOR CHEST PAIN FOR 3 DOSES IF NO RELIEF AFTER FIRST DOSE CALL PRESCRIBER OR 911 25 tablet 6  . ramipril (ALTACE) 5 MG capsule TAKE 1 CAPSULE DAILY 90 capsule 3  . spironolactone (ALDACTONE) 25 MG tablet TAKE 1/2 TABLET (=12.5MG    TOTAL) DAILY 45 tablet 3   No current facility-administered medications for this visit.     Past Medical History:  Diagnosis Date  . Anxiety   . Atrial fibrillation ([place])   . Cardiac defibrillator in situ    s/p MDT Maximo VR single lead ICD - 07/06/2006  . 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  . Degenerative joint disease   . DEGENERATIVE JOINT DISEASE 04/09/2009   Qualifier: Diagnosis of  By: Darrick Meigs    . Depression   . GE reflux 09/19/2011  . H/O hiatal hernia    times 3  . History of orthostatic hypotension   . Hyperlipidemia   . Hypertension   . Hypothyroidism   . Ischemic cardiomyopathy    EF 40% 12/2008 V gram;  Echocardiogram 11/08/11: EF 30-35%, inferoseptal and anteroseptal, apical septal/lateral/anterior/inferior and true apical akinesis, no LV thrombus, grade 1 diastolic dysfunction.  . Lumbar disc disease 09/19/2011  . Lumbar spondylosis    s/p L5-S1 fusion 12/11/2009  . NSVT (nonsustained ventricular tachycardia) ([place]) 06/23/2019  . Premature atrial contractions   . Premature ventricular contractions   . Skin cancer    skin  . Systolic CHF, chronic ([facility])     ROS:   All systems reviewed and negative except as noted in the HPI.   Past Surgical History:  Procedure Laterality Date  . BACK SURGERY     x 2  . EP IMPLANTABLE DEVICE [name] 04/25/2015  Procedure: ICD Generator Changeout;  Surgeon: Evans Lance, MD;  Location: [facility];  Service: Cardiovascular;  Laterality: [name];  . icd     s/p MDT Maximo VR single lead ICD - 07/06/2006  . INGUINAL HERNIA REPAIR Left    x 3  . LEFT HEART CATHETERIZATION WITH CORONARY ANGIOGRAM [name] 11/09/2011   Procedure: LEFT HEART CATHETERIZATION WITH CORONARY ANGIOGRAM;  Surgeon: Josue Hector, MD;  Location: [facility] CATH LAB;  Service: Cardiovascular;  Laterality: [name];  . LUMBAR FUSION     s/p L5-S1 fusion 12/11/2009  . MOHS SURGERY     x 3, head nose  . SKIN CANCER EXCISION       Family History  Problem Relation Age of Onset  . Peripheral vascular disease Father        deceased 86  .  Pneumonia Mother        deceased 56  . Prostate cancer Brother   . Diabetes Brother   . Heart disease Brother   . Cancer Brother        unknown type     Social History   Socioeconomic History  . Marital status: Married    Spouse name: Not on file  . Number of children: 2  . Years of education: Not on file  . Highest education level: Not on file  Occupational History  . Occupation: retired    Fish farm manager: [name]  Tobacco Use  . Smoking status: Never Smoker  . Smokeless tobacco: Never Used  Vaping Use  . Vaping Use: Never used  Substance and Sexual Activity  . Alcohol use: No  . Drug use: No  . Sexual activity: Never  Other Topics Concern  . Not on file  Social History Narrative   Active, walks regularly.   Social Determinants of Health   Financial Resource Strain:   . Difficulty of Paying Living Expenses: Not on file  Food Insecurity:   . Worried About Charity fundraiser in the Last Year: Not on file  . Ran Out of Food in the Last Year: Not on file  Transportation Needs:   . Lack of Transportation (Medical): Not on file  . Lack of Transportation (Non-Medical): Not on file  Physical Activity:   . Days of Exercise per Week: Not on file  . Minutes of Exercise per Session: Not on file  Stress:   . Feeling of Stress : Not on file  Social Connections:   . Frequency of Communication with Friends and Family: Not on file  . Frequency of Social Gatherings with Friends and Family: Not on file  . Attends Religious Services: Not on file  . Active Member of Clubs or Organizations: Not on file  . Attends Archivist Meetings: Not on file  . Marital Status: Not on file  Intimate Partner Violence:   . Fear of Current or Ex-Partner: Not on file  . Emotionally Abused: Not on file  . Physically Abused: Not on file  . Sexually Abused: Not on file     BP 130/74   Pulse 67   Ht 5' 7.5" (1.715 m)   Wt 135 lb (61.2 kg)   SpO2 96%   BMI 20.83 kg/m    Physical Exam:  Well appearing NAD HEENT: Unremarkable Neck:  No JVD, no thyromegally, severe kyphosis Lymphatics:  No adenopathy Back:  No CVA tenderness Lungs:  Clear, with no wheezes, rales, or rhonchi HEART:  Regular rate rhythm, no murmurs, no rubs,  no clicks Abd:  soft, positive bowel sounds, no organomegally, no rebound, no guarding Ext:  2 plus pulses, no edema, no cyanosis, no clubbing Skin:  No rashes no nodules Neuro:  CN II through XII intact, motor grossly intact  EKG -normal sinus rhythm with occasional PACs  DEVICE  Normal device function.  See PaceArt for details.   Assess/Plan: 1.  Sustained monomorphic ventricular tachycardia -he has had no recurrent ventricular arrhythmias.  He will continue his current medical therapy. 2.  Chronic systolic heart failure.  He denies dyspnea but admits to being extremely sedentary secondary to his severe kyphosis in the cervical spine 3.  ICD -his Medtronic single-chamber ICD is working normally and has approximately 6 years of battery longevity.  He is fairly inactive.

## 2020-07-15 NOTE — Patient Instructions (Signed)
Medication Instructions:  Your physician recommends that you continue on your current medications as directed. Please refer to the Current Medication list given to you today.  Labwork: None ordered.  Testing/Procedures: None ordered.  Follow-Up: Your physician wants you to follow-up in: one year with Dr. Lovena Le.   You will receive a reminder letter in the mail two months in advance. If you don't receive a letter, please call our office to schedule the follow-up appointment.  Remote monitoring is used to monitor your ICD from home. This monitoring reduces the number of office visits required to check your device to one time per year. It allows [name] to keep an eye on the functioning of your device to ensure it is working properly. You are scheduled for a device check from home on 08/26/2020. You may send your transmission at any time that day. If you have a wireless device, the transmission will be sent automatically. After your physician reviews your transmission, you will receive a postcard with your next transmission date.  Any Other Special Instructions Will Be Listed Below (If Applicable).  If you need a refill on your cardiac medications before your next appointment, please call your pharmacy.

## 2020-07-18 ENCOUNTER — Ambulatory Visit: Payer: Medicare Other | Admitting: Internal Medicine

## 2020-07-24 ENCOUNTER — Other Ambulatory Visit: Payer: Self-pay

## 2020-07-24 ENCOUNTER — Ambulatory Visit (INDEPENDENT_AMBULATORY_CARE_PROVIDER_SITE_OTHER): Payer: Medicare Other | Admitting: Podiatry

## 2020-07-24 ENCOUNTER — Encounter: Payer: Self-pay | Admitting: Podiatry

## 2020-07-24 DIAGNOSIS — B351 Tinea unguium: Secondary | ICD-10-CM

## 2020-07-24 DIAGNOSIS — M79674 Pain in right toe(s): Secondary | ICD-10-CM

## 2020-07-24 DIAGNOSIS — L84 Corns and callosities: Secondary | ICD-10-CM

## 2020-07-24 DIAGNOSIS — M79675 Pain in left toe(s): Secondary | ICD-10-CM

## 2020-07-24 DIAGNOSIS — Q828 Other specified congenital malformations of skin: Secondary | ICD-10-CM

## 2020-07-24 NOTE — Progress Notes (Signed)
This patient returns to the office for evaluation and treatment of long thick painful nails .  This patient is unable to trim his own nails since the patient cannot reach his feet.  Patient says the nails are painful walking and wearing his shoes.  He says he has pain in the callus under his big toe joint left foot.  He returns for preventive foot care services.  General Appearance  Alert, conversant and in no acute stress.  Vascular  Dorsalis pedis and posterior tibial  pulses are palpable  bilaterally.  Capillary return is within normal limits  bilaterally. Temperature is within normal limits  bilaterally.  Neurologic  Senn-Weinstein monofilament wire test within normal limits  bilaterally. Muscle power within normal limits bilaterally.  Nails Thick disfigured discolored nails with subungual debris  from hallux to fifth toes bilaterally. No evidence of bacterial infection or drainage bilaterally.  Subungual hematoma dried second toe left foot.    Orthopedic  No limitations of motion  feet .  No crepitus or effusions noted.  No bony pathology or digital deformities noted.  Skin  normotropic skin with no porokeratosis noted bilaterally.  No signs of infections or ulcers noted.   Callus sub 1 left foot.  Onychomycosis  Pain in toes right foot  Pain in toes left foot  Porokeratosis sub 1 left foot.  Debridement  of nails  1-5  B/L with a nail nipper.  Nails were then filed using a dremel tool with no incidents.  Debride callus sub 1st MPJ left foot with # 15 blade.  Call the office if problem occurs.  RTC  3 months.   Olukemi Panchal DPM  

## 2020-07-30 ENCOUNTER — Other Ambulatory Visit: Payer: Self-pay | Admitting: Internal Medicine

## 2020-07-30 ENCOUNTER — Other Ambulatory Visit: Payer: Self-pay | Admitting: Cardiology

## 2020-07-31 NOTE — Progress Notes (Signed)
HPI: FU coronary artery disease. Abdominal CT in 2005 showed no aneurysm. Patient had a myocardial infarction in 2004. He had PCI of his LAD. His ejection fraction was 30%. Last catheterization in 2007 showed patent stents in the LAD. There is an 80% ostial first diagonal, occluded second diagonal and normal circumflex and right coronary artery. Ejection fraction was 30%. Patient had an ICD placed. Last echocardiogram in December 2012 showed an ejection fraction of 30-35%. There was grade 1 diastolic dysfunction. There was mild left atrial enlargement. Nuclear study May 2015 showed ejection fraction 34%. There was scar with mild peri-infarct ischemia. He has been treated medically. Since he was last seen,patient denies dyspnea, chest pain, palpitations or syncope.  Current Outpatient Medications  Medication Sig Dispense Refill  . aspirin 81 MG EC tablet Take 81 mg by mouth daily.      Marland Kitchen atorvastatin (LIPITOR) 40 MG tablet TAKE 1 TABLET BY MOUTH DAILY 90 tablet 3  . calcium carbonate (OS-CAL) 600 MG TABS Take 600 mg by mouth daily.     . carvedilol (COREG) 6.25 MG tablet TAKE 1 TABLET(6.25 MG) BY MOUTH TWICE DAILY WITH A MEAL 180 tablet 3  . cholecalciferol (VITAMIN D) 1000 UNITS tablet Take 1,000 Units by mouth daily.     Marland Kitchen levothyroxine (SYNTHROID) 50 MCG tablet TAKE 1 TABLET(50 MCG) BY MOUTH EVERY MORNING 90 tablet 3  . nitroGLYCERIN (NITROSTAT) 0.4 MG SL tablet PLACE 1 TABLET UNDER THE TONGUE IF NEEDED EVERY 5 MINUTES FOR CHEST PAIN FOR 3 DOSES IF NO RELIEF AFTER FIRST DOSE CALL PRESCRIBER OR 911 25 tablet 6  . ramipril (ALTACE) 5 MG capsule TAKE 1 CAPSULE DAILY 90 capsule 3  . spironolactone (ALDACTONE) 25 MG tablet TAKE 1/2 TABLET (=12.5MG    TOTAL) DAILY 45 tablet 3   No current facility-administered medications for this visit.     Past Medical History:  Diagnosis Date  . Anxiety   . Atrial fibrillation ([place])   . Cardiac defibrillator in situ    s/p MDT Maximo VR single lead  ICD - 07/06/2006  . 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  . Degenerative joint disease   . DEGENERATIVE JOINT DISEASE 04/09/2009   Qualifier: Diagnosis of  By: Darrick Meigs    . Depression   . GE reflux 09/19/2011  . H/O hiatal hernia    times 3  . History of orthostatic hypotension   . Hyperlipidemia   . Hypertension   . Hypothyroidism   . Ischemic cardiomyopathy    EF 40% 12/2008 V gram;  Echocardiogram 11/08/11: EF 30-35%, inferoseptal and anteroseptal, apical septal/lateral/anterior/inferior and true apical akinesis, no LV thrombus, grade 1 diastolic dysfunction.  . Lumbar disc disease 09/19/2011  . Lumbar spondylosis    s/p L5-S1 fusion 12/11/2009  . NSVT (nonsustained ventricular tachycardia) ([place]) 06/23/2019  . Premature atrial contractions   . Premature ventricular contractions   . Skin cancer    skin  . Systolic CHF, chronic ([place])     Past Surgical History:  Procedure Laterality Date  . BACK SURGERY     x 2  . EP IMPLANTABLE DEVICE [name] 04/25/2015   Procedure: ICD Generator Changeout;  Surgeon: Evans Lance, MD;  Location: [facility];  Service: Cardiovascular;  Laterality: [name];  . icd     s/p MDT Maximo VR single lead ICD - 07/06/2006  . INGUINAL HERNIA REPAIR Left    x 3  . LEFT HEART CATHETERIZATION WITH CORONARY ANGIOGRAM [name] 11/09/2011   Procedure: LEFT HEART CATHETERIZATION WITH CORONARY ANGIOGRAM;  Surgeon: Josue Hector, MD;  Location: [facility] CATH LAB;  Service: Cardiovascular;  Laterality: [name];  . LUMBAR FUSION     s/p L5-S1 fusion 12/11/2009  . MOHS SURGERY     x 3, head nose  . SKIN CANCER EXCISION      Social History   Socioeconomic History  . Marital status:  Married    Spouse name: Not on file  . Number of children: 2  . Years of education: Not on file  . Highest education level: Not on file  Occupational History  . Occupation: retired    Fish farm manager: [name]  Tobacco Use  . Smoking status: Never Smoker  . Smokeless tobacco: Never Used  Vaping Use  . Vaping Use: Never used  Substance and Sexual Activity  . Alcohol use: No  . Drug use: No  . Sexual activity: Never  Other Topics Concern  . Not on file  Social History Narrative   Active, walks regularly.   Social Determinants of Health   Financial Resource Strain:   . Difficulty of Paying Living Expenses: Not on file  Food Insecurity:   . Worried About Charity fundraiser in the Last Year: Not on file  . Ran Out of Food in the Last Year: Not on file  Transportation Needs:   . Lack of Transportation (Medical): Not on file  . Lack of Transportation (Non-Medical): Not on file  Physical Activity:   . Days of Exercise per Week: Not on file  . Minutes of Exercise per Session: Not on file  Stress:   . Feeling of Stress : Not on file  Social Connections:   . Frequency of Communication with Friends and Family: Not on file  . Frequency of Social Gatherings with Friends and Family: Not on file  . Attends Religious Services: Not on file  . Active Member of Clubs or Organizations: Not on file  . Attends Archivist Meetings: Not on file  . Marital Status: Not on file  Intimate Partner Violence:   . Fear of Current or Ex-Partner: Not on file  . Emotionally Abused: Not on file  . Physically Abused: Not on file  . Sexually Abused: Not on file    Family History  Problem Relation Age of Onset  . Peripheral vascular disease Father        deceased 35  . Pneumonia Mother        deceased 2  . Prostate cancer Brother   . Diabetes Brother   . Heart disease Brother   . Cancer Brother        unknown type    ROS: Problems with neck pain but no fevers or chills, productive  cough, hemoptysis, dysphasia, odynophagia, melena, hematochezia, dysuria, hematuria, rash, seizure activity, orthopnea, PND, pedal edema, claudication. Remaining systems are negative.  Physical Exam: Well-developed well-nourished in no acute distress.  Skin is warm and dry.  HEENT is normal.  Neck is significant for hyperflexion Chest is clear to auscultation with normal expansion.  Cardiovascular exam is regular rate and rhythm.  Abdominal exam nontender or distended. No masses palpated. Extremities  show no edema. neuro grossly intact  A/P  1 coronary artery disease-no chest pain.  Plan to continue medical therapy with aspirin and statin.  2 ischemic cardiomyopathy-continue ACE inhibitor and beta-blocker.  We discussed Entresto today but he would prefer to continue without change at this point.  3 chronic systolic congestive heart failure-euvolemic on examination.  Continue diuretic at present dose.  We will have most recent potassium and renal function forwarded to [name] from primary care.  4 hypertension-blood pressure mildly elevated but he states typically controlled.  Continue present medications.  5 hyperlipidemia-continue statin.  6 ICD-followed by Dr. Lovena Le.  Kirk Ruths, MD

## 2020-08-05 ENCOUNTER — Ambulatory Visit (INDEPENDENT_AMBULATORY_CARE_PROVIDER_SITE_OTHER): Payer: Medicare Other | Admitting: Cardiology

## 2020-08-05 ENCOUNTER — Other Ambulatory Visit: Payer: Self-pay

## 2020-08-05 ENCOUNTER — Encounter: Payer: Self-pay | Admitting: Cardiology

## 2020-08-05 VITALS — BP 150/80 | HR 65 | Ht 67.5 in | Wt 140.8 lb

## 2020-08-05 DIAGNOSIS — I2510 Atherosclerotic heart disease of native coronary artery without angina pectoris: Secondary | ICD-10-CM | POA: Diagnosis not present

## 2020-08-05 DIAGNOSIS — I10 Essential (primary) hypertension: Secondary | ICD-10-CM

## 2020-08-05 DIAGNOSIS — I255 Ischemic cardiomyopathy: Secondary | ICD-10-CM

## 2020-08-05 DIAGNOSIS — E7800 Pure hypercholesterolemia, unspecified: Secondary | ICD-10-CM

## 2020-08-05 MED ORDER — RAMIPRIL 5 MG PO CAPS: 5.0000 mg | ORAL_CAPSULE | Freq: Every day | ORAL | 3 refills | Status: DC

## 2020-08-05 NOTE — Patient Instructions (Signed)
Medication Instructions:  Your physician recommends that you continue on your current medications as directed. Please refer to the Current Medication list given to you today.  *If you need a refill on your cardiac medications before your next appointment, please call your pharmacy*  Lab Work: NONE   Testing/Procedures: NONE   Follow-Up: At [facility], you and your health needs are our priority.  As part of our continuing mission to provide you with exceptional heart care, we have created designated Provider Care Teams.  These Care Teams include your primary Cardiologist (physician) and Advanced Practice Providers (APPs -  Physician Assistants and Nurse Practitioners) who all work together to provide you with the care you need, when you need it.  We recommend signing up for the patient portal called "MyChart".  Sign up information is provided on this After Visit Summary.  MyChart is used to connect with patients for Virtual Visits (Telemedicine).  Patients are able to view lab/test results, encounter notes, upcoming appointments, etc.  Non-urgent messages can be sent to your provider as well.   To learn more about what you can do with MyChart, go to [email or web address].    Your next appointment:   12 month(s)  You will receive a reminder letter in the mail two months in advance. If you don't receive a letter, please call our office to schedule the follow-up appointment.  The format for your next appointment:   In Person  Provider:   You may see DR Stanford Breed or one of the following Advanced Practice Providers on your designated Care Team:    Kerin Ransom, PA-C  [place], [place]  Coletta Memos, [place]

## 2020-08-13 ENCOUNTER — Other Ambulatory Visit: Payer: Self-pay | Admitting: Internal Medicine

## 2020-08-26 ENCOUNTER — Ambulatory Visit (INDEPENDENT_AMBULATORY_CARE_PROVIDER_SITE_OTHER): Payer: Medicare Other

## 2020-08-26 DIAGNOSIS — I255 Ischemic cardiomyopathy: Secondary | ICD-10-CM

## 2020-08-26 LAB — CUP PACEART REMOTE DEVICE CHECK
Battery Remaining Longevity: 65 mo
Battery Voltage: 2.99 V
Brady Statistic RV Percent Paced: 0.2 %
Date Time Interrogation Session: 20211004022703
HighPow Impedance: 42 Ohm
HighPow Impedance: 59 Ohm
Implantable Lead Implant Date: 20070814
Implantable Lead Location: 753860
Implantable Lead Model: 6947
Implantable Pulse Generator Implant Date: 20160602
Lead Channel Impedance Value: 418 Ohm
Lead Channel Impedance Value: 513 Ohm
Lead Channel Pacing Threshold Amplitude: 0.875 V
Lead Channel Pacing Threshold Pulse Width: 0.4 ms
Lead Channel Sensing Intrinsic Amplitude: 22.875 mV
Lead Channel Sensing Intrinsic Amplitude: 22.875 mV
Lead Channel Setting Pacing Amplitude: 2.5 V
Lead Channel Setting Pacing Pulse Width: 0.4 ms
Lead Channel Setting Sensing Sensitivity: 0.3 mV

## 2020-08-29 NOTE — Progress Notes (Signed)
Remote ICD transmission.   

## 2020-09-05 NOTE — Patient Instructions (Signed)

## 2020-09-06 ENCOUNTER — Telehealth: Payer: Self-pay | Admitting: Internal Medicine

## 2020-09-06 ENCOUNTER — Ambulatory Visit (INDEPENDENT_AMBULATORY_CARE_PROVIDER_SITE_OTHER): Payer: Medicare Other | Admitting: Internal Medicine

## 2020-09-06 ENCOUNTER — Other Ambulatory Visit: Payer: Self-pay

## 2020-09-06 ENCOUNTER — Encounter: Payer: Self-pay | Admitting: Internal Medicine

## 2020-09-06 VITALS — BP 152/80 | HR 60 | Temp 97.5°F | Resp 18 | Ht 63.0 in | Wt 136.0 lb

## 2020-09-06 DIAGNOSIS — Z13220 Encounter for screening for lipoid disorders: Secondary | ICD-10-CM

## 2020-09-06 DIAGNOSIS — Z23 Encounter for immunization: Secondary | ICD-10-CM

## 2020-09-06 DIAGNOSIS — Z0000 Encounter for general adult medical examination without abnormal findings: Secondary | ICD-10-CM

## 2020-09-06 DIAGNOSIS — Z13228 Encounter for screening for other metabolic disorders: Secondary | ICD-10-CM

## 2020-09-06 DIAGNOSIS — R4589 Other symptoms and signs involving emotional state: Secondary | ICD-10-CM

## 2020-09-06 DIAGNOSIS — E785 Hyperlipidemia, unspecified: Secondary | ICD-10-CM | POA: Diagnosis not present

## 2020-09-06 DIAGNOSIS — Z130 Encounter for screening for diseases of the blood and blood-forming organs and certain disorders involving the immune mechanism: Secondary | ICD-10-CM

## 2020-09-06 DIAGNOSIS — Z125 Encounter for screening for malignant neoplasm of prostate: Secondary | ICD-10-CM

## 2020-09-06 MED ORDER — SERTRALINE HCL 25 MG PO TABS: ORAL_TABLET | ORAL | 1 refills | Status: DC

## 2020-09-06 NOTE — Progress Notes (Signed)
Subjective:   Corey Jordan is a 86 y.o. male who presents for Medicare Annual/Subsequent preventive examination. His stepson accompanies him.   Has concerns about depression. Reports he feels like everything is falling apart with his body and that things are not going well for him. He is worried about his wife who has Alzheimer's. Does not feel motivated to do anything. Feels very discouraged. His stepson has been concerned about his mood for years. Patient requests low dose medication.   Review of Systems:  Patient reports no  vision/ hearing changes,anorexia, weight change, fever ,adenopathy, persistant / recurrent hoarseness, swallowing issues, chest pain, edema,persistant / recurrent cough, hemoptysis, dyspnea(rest, exertional, paroxysmal nocturnal), gastrointestinal  bleeding (melena, rectal bleeding), abdominal pain, excessive heart burn, GU symptoms(dysuria, hematuria, pyuria, voiding/incontinence  Issues) syncope, focal weakness, severe memory loss, concerning skin lesions, abnormal bruising/bleeding, major joint swelling.         Objective:    Vitals: BP (!) 152/80    Pulse 60    Temp (!) 97.5 F (36.4 C) (Temporal)    Resp 18    Ht 5\' 3"  (1.6 m)    Wt 136 lb (61.7 kg)    SpO2 97%    BMI 24.09 kg/m   Body mass index is 24.09 kg/m.  Advanced Directives 09/06/2020 01/06/2017 01/06/2017 04/25/2015 11/06/2011  Does Patient Have a Medical Advance Directive? No No No No Patient does not have advance directive  Would patient like information on creating a medical advance directive? Yes (MAU/Ambulatory/Procedural Areas - Information given) No - Patient declined - Yes - Educational materials given -  Pre-existing out of facility DNR order (yellow form or pink MOST form) - - - - No    Tobacco Social History   Tobacco Use  Smoking Status Never Smoker  Smokeless Tobacco Never Used     Counseling given: Not Answered    Past Medical History:  Diagnosis Date   Anxiety    Atrial  fibrillation ([place])    Cardiac defibrillator in situ    s/p MDT Maximo VR single lead ICD - 07/06/2006   Coronary artery disease    s/p Anterior MI 10/23/03 - 2 Cypher DES placed in 100% LAD; 12/2008 - cath - patent LAD stents w/ 90% D1 stenosis (small - unchanged);  LHC 11/09/11: LAD stent patent, mid 20-30%, D1 70-80% ostial (no change with 2010), circumflex normal, proximal RCA 20-30%, EF 35-40%, 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   Degenerative joint disease    DEGENERATIVE JOINT DISEASE 04/09/2009   Qualifier: Diagnosis of  By: Darrick Meigs     Depression    GE reflux 09/19/2011   H/O hiatal hernia    times 3   History of orthostatic hypotension    Hyperlipidemia    Hypertension    Hypothyroidism    Ischemic cardiomyopathy    EF 40% 12/2008 V gram;  Echocardiogram 11/08/11: EF 30-35%, inferoseptal and anteroseptal, apical septal/lateral/anterior/inferior and true apical akinesis, no LV thrombus, grade 1 diastolic dysfunction.   Lumbar disc disease 09/19/2011   Lumbar spondylosis    s/p L5-S1 fusion 12/11/2009   NSVT (nonsustained ventricular tachycardia) ([place]) 06/23/2019   Premature atrial contractions    Premature ventricular contractions    Skin cancer    skin   Systolic CHF, chronic ([place])    Past Surgical History:  Procedure Laterality Date   BACK SURGERY     x 2   EP IMPLANTABLE DEVICE [name] 04/25/2015   Procedure: ICD Nature conservation officer;  Surgeon:  Evans Lance, MD;  Location: [facility];  Service: Cardiovascular;  Laterality: [name];   icd     s/p MDT Maximo VR single lead ICD - 07/06/2006   INGUINAL HERNIA REPAIR Left    x 3   LEFT HEART CATHETERIZATION WITH CORONARY ANGIOGRAM [name] 11/09/2011   Procedure: LEFT HEART CATHETERIZATION WITH CORONARY ANGIOGRAM;  Surgeon: Josue Hector, MD;  Location: [facility]ATH [facility];  Service: Cardiovascular;  Laterality: [name];   LUMBAR FUSION     s/p L5-S1 fusion 12/11/2009   MOHS SURGERY     x 3, head nose    SKIN CANCER EXCISION     Family History  Problem Relation Age of Onset   Peripheral vascular disease Father        deceased 35   Pneumonia Mother        deceased 29   Prostate cancer Brother    Diabetes Brother    Heart disease Brother    Cancer Brother        unknown type   Social History   Socioeconomic History   Marital status: Married    Spouse name: Not on file   Number of children: 2   Years of education: Not on file   Highest education level: Not on file  Occupational History   Occupation: retired    Fish farm manager: [name]  Tobacco Use   Smoking status: Never Smoker   Smokeless tobacco: Never Used  Scientific laboratory technician Use: Never used  Substance and Sexual Activity   Alcohol use: No   Drug use: No   Sexual activity: Never  Other Topics Concern   Not on file  Social History Narrative   Active, walks regularly.   Social Determinants of Health   Financial Resource Strain:    Difficulty of Paying Living Expenses: Not on file  Food Insecurity:    Worried About Charity fundraiser in the Last Year: Not on file   [employer] of Food in the Last Year: Not on file  Transportation Needs:    Lack of Transportation (Medical): Not on file   Lack of Transportation (Non-Medical): Not on file  Physical Activity:    Days of Exercise per Week: Not on file   Minutes of Exercise per Session: Not on file  Stress:    Feeling of Stress : Not on file  Social Connections:    Frequency of Communication with Friends and Family: Not on file   Frequency of Social Gatherings with Friends and Family: Not on file   Attends Religious Services: Not on file   Active Member of Clubs or Organizations: Not on file   Attends Club or Organization Meetings: Not on file   Marital Status: Not on file    Outpatient Encounter Medications as of 09/06/2020  Medication Sig   aspirin 81 MG EC tablet Take 81 mg by mouth daily.     atorvastatin (LIPITOR) 40 MG  tablet TAKE 1 TABLET BY MOUTH DAILY   calcium carbonate (OS-CAL) 600 MG TABS Take 600 mg by mouth daily.    carvedilol (COREG) 6.25 MG tablet TAKE 1 TABLET(6.25 MG) BY MOUTH TWICE DAILY WITH A MEAL   cholecalciferol (VITAMIN D) 1000 UNITS tablet Take 1,000 Units by mouth daily.    levothyroxine (SYNTHROID) 50 MCG tablet TAKE 1 TABLET(50 MCG) BY MOUTH EVERY MORNING   nitroGLYCERIN (NITROSTAT) 0.4 MG SL tablet PLACE 1 TABLET UNDER THE TONGUE IF NEEDED EVERY 5 MINUTES FOR CHEST  PAIN FOR 3 DOSES IF NO RELIEF AFTER FIRST DOSE CALL PRESCRIBER OR 911   ramipril (ALTACE) 5 MG capsule Take 1 capsule (5 mg total) by mouth daily.   spironolactone (ALDACTONE) 25 MG tablet TAKE 1/2 TABLET (=12.5MG    TOTAL) DAILY   No facility-administered encounter medications on file as of 09/06/2020.    Activities of Daily Living No flowsheet data found.  Patient Care Team: Nicolette Bang, DO as PCP - General (Family Medicine)   Assessment:   This is a routine wellness examination for Tsutomu.  Exercise Activities and Dietary recommendations    Goals   None     Fall Risk Fall Risk  09/06/2020 04/28/2019 10/27/2018 04/26/2018 10/22/2017  Falls in the past year? 1 1 1  Yes Yes  Comment - - - lost balance, hit right arm -  Number falls in past yr: 0 0 0 - 1  Injury with Fall? 0 0 1 No Yes  Comment - - SPINE - hit right arm   Risk for fall due to : - Other (Comment) - - -  Follow up - Falls evaluation completed;Education provided;Falls prevention discussed - - -   Is the patient's home free of loose throw rugs in walkways, pet beds, electrical cords, etc?   Yes    Grab bars in the bathroom? no      Handrails on the stairs?   yes      Adequate lighting?   yes  Timed Get Up and Go Performed: yes, passed  Patient rating of health (0-10): 4   Depression Screen PHQ 2/9 Scores 09/06/2020 10/27/2018 04/20/2017 05/22/2015  PHQ - 2 Score 0 2 0 0  PHQ- 9 Score 0 - - -    Cognitive Function MMSE -  Mini Mental State Exam 09/06/2020  Orientation to time 5  Orientation to Place 5  Registration 3  Attention/ Calculation 5  Recall 1  Language- name 2 objects 2  Language- repeat 1  Language- follow 3 step command 3  Language- read & follow direction 1  Write a sentence 1  Copy design 0  Total score 27        Immunization History  Administered Date(s) Administered   Influenza Split 09/18/2011, 09/27/2012   Influenza Whole 08/23/2010   Influenza,inj,Quad PF,6+ Mos 10/02/2013, 09/05/2014, 10/22/2015, 10/20/2016, 10/22/2017, 10/27/2018, 10/31/2019   Janssen (J&J) SARS-COV-2 Vaccination 06/17/2020   Pneumococcal Conjugate-13 09/06/2015   Pneumococcal Polysaccharide-23 11/23/2008   Tdap 02/21/2010    Screening Tests Health Maintenance  Topic Date Due   TETANUS/TDAP  02/22/2020   INFLUENZA VACCINE  06/23/2020   COVID-19 Vaccine  Completed   PNA vac Low Risk Adult  Completed   Cancer Screenings: Lung: Low Dose CT Chest recommended if Age 24-80 years, 30 pack-year currently smoking OR have quit w/in 15years. Patient does not qualify. Colorectal: Does not qualify as age >47       Plan:     1. Encounter for Medicare annual wellness exam  I have personally reviewed and noted the following in the patients chart:    Medical and social history  Use of alcohol, tobacco or illicit drugs   Current medications and supplements  Functional ability and status  Nutritional status  Physical activity  Advanced directives  List of other physicians  Hospitalizations, surgeries, and ER visits in previous 12 months  Vitals  Screenings to include cognitive, depression, and falls  Referrals and appointments  In addition, I have reviewed and discussed with patient certain preventive  protocols, quality metrics, and best practice recommendations. A written personalized care plan for preventive services as well as general preventive health recommendations were  provided to patient.   2. Annual physical exam - Comprehensive metabolic panel - Lipid Panel  3. Lipid screening - Lipid Panel  4. Screening for metabolic disorder - Comprehensive metabolic panel  5. Need for Tdap vaccination - Tdap vaccine greater than or equal to 7yo IM  6. Needs flu shot - Flu Vaccine QUAD 6+ mos PF IM (Fluarix Quad PF)  7. Depressed mood Despite PHQ-9 score of 0, patient reports concerns about depressed mood and stepson coroborates concerns. Has asked for trial of medication. Start low dose Zoloft. Discussed appropriate use and potential side effects. Offer appointment with Christa See, LCSW and return in 4-6 weeks with PCP to check on mood and medication. No safety concerns for patient.  - sertraline (ZOLOFT) 25 MG tablet; Take one tablet daily. If tolerating, increase to two tablets in one week.  Dispense: 60 tablet; Refill: [place], D.O. [facility]  09/06/2020, 10:02 AM

## 2020-09-07 LAB — LIPID PANEL
Chol/HDL Ratio: 2.1 ratio (ref 0.0–5.0)
Cholesterol, Total: 151 mg/dL (ref 100–199)
HDL: 73 mg/dL (ref >39)
LDL Chol Calc (NIH): 66 mg/dL (ref 0–99)
Triglycerides: 60 mg/dL (ref 0–149)
VLDL Cholesterol Cal: 12 mg/dL (ref 5–40)

## 2020-09-07 LAB — COMPREHENSIVE METABOLIC PANEL
ALT: 24 IU/L (ref 0–44)
AST: 30 IU/L (ref 0–40)
Albumin/Globulin Ratio: 2.2 (ref 1.2–2.2)
Albumin: 4.7 g/dL — ABNORMAL HIGH (ref 3.6–4.6)
Alkaline Phosphatase: 77 IU/L (ref 44–121)
BUN/Creatinine Ratio: 20 (ref 10–24)
BUN: 26 mg/dL (ref 8–27)
Bilirubin Total: 0.6 mg/dL (ref 0.0–1.2)
CO2: 25 mmol/L (ref 20–29)
Calcium: 10.3 mg/dL — ABNORMAL HIGH (ref 8.6–10.2)
Chloride: 97 mmol/L (ref 96–106)
Creatinine, Ser: 1.29 mg/dL — ABNORMAL HIGH (ref 0.76–1.27)
GFR calc Af Amer: 58 mL/min/{1.73_m2} — ABNORMAL LOW (ref >59)
GFR calc non Af Amer: 51 mL/min/{1.73_m2} — ABNORMAL LOW (ref >59)
Globulin, Total: 2.1 g/dL (ref 1.5–4.5)
Glucose: 90 mg/dL (ref 65–99)
Potassium: 4.8 mmol/L (ref 3.5–5.2)
Sodium: 135 mmol/L (ref 134–144)
Total Protein: 6.8 g/dL (ref 6.0–8.5)

## 2020-09-09 ENCOUNTER — Other Ambulatory Visit: Payer: Self-pay | Admitting: Internal Medicine

## 2020-09-09 DIAGNOSIS — R7989 Other specified abnormal findings of blood chemistry: Secondary | ICD-10-CM

## 2020-09-09 MED ORDER — SERTRALINE HCL 25 MG PO TABS: ORAL_TABLET | ORAL | 1 refills | Status: DC

## 2020-09-09 NOTE — Telephone Encounter (Signed)
pharmacey stated they never got sertraline (ZOLOFT) 25 MG

## 2020-09-09 NOTE — Telephone Encounter (Signed)
Resent prescription

## 2020-09-10 ENCOUNTER — Other Ambulatory Visit: Payer: Self-pay

## 2020-09-10 ENCOUNTER — Ambulatory Visit (INDEPENDENT_AMBULATORY_CARE_PROVIDER_SITE_OTHER): Payer: Medicare Other | Admitting: Licensed Clinical Social Worker

## 2020-09-10 DIAGNOSIS — F4321 Adjustment disorder with depressed mood: Secondary | ICD-10-CM

## 2020-09-20 ENCOUNTER — Telehealth: Payer: Self-pay | Admitting: Internal Medicine

## 2020-09-20 ENCOUNTER — Encounter: Payer: Self-pay | Admitting: *Deleted

## 2020-09-20 ENCOUNTER — Ambulatory Visit: Payer: Self-pay | Admitting: *Deleted

## 2020-09-20 NOTE — Telephone Encounter (Signed)
Patient returning call regarding lab results, patient would like a follow up call today.

## 2020-09-20 NOTE — Telephone Encounter (Signed)
Please refer to [facility] triage Rn note from today .

## 2020-09-20 NOTE — Telephone Encounter (Signed)
Pt returned call for lab results. Pt ph [phone]

## 2020-09-20 NOTE — Telephone Encounter (Signed)
Pt given lab results per notes of C. Wallace, DO from 09/09/20  on 09/20/20. Pt verbalized understanding.

## 2020-09-24 ENCOUNTER — Ambulatory Visit: Payer: Medicare Other | Admitting: Licensed Clinical Social Worker

## 2020-09-24 ENCOUNTER — Other Ambulatory Visit: Payer: Self-pay

## 2020-09-24 DIAGNOSIS — F4323 Adjustment disorder with mixed anxiety and depressed mood: Secondary | ICD-10-CM

## 2020-09-25 NOTE — BH Specialist Note (Signed)
Follow up call placed to patient. Pt reports that he has been feeling "foolish" describing it as low energy, dizzy, and daily headaches since the start of sertraline two weeks ago. Pt reported that the medication is "making it worse" and he is not interested in continuing this medication, if symptoms persist. He denies any new stressors or triggers that could negatively impact his depression and anxiety symptoms.   Pt endorses headaches that last for approx 5-6 hours regardless of when he takes the medications (night or morning). Reports he is taking medication as prescribed.  LCSW provided support and encouragement. Pt would like for PCP to advise. Message routed to PCP.

## 2020-09-27 ENCOUNTER — Emergency Department (HOSPITAL_COMMUNITY)
Admission: EM | Admit: 2020-09-27 | Discharge: 2020-09-27 | Disposition: A | Discharge status: Home / Self Care | Payer: Medicare Other | Attending: Emergency Medicine | Admitting: Emergency Medicine

## 2020-09-27 ENCOUNTER — Other Ambulatory Visit: Payer: Self-pay

## 2020-09-27 ENCOUNTER — Emergency Department (HOSPITAL_COMMUNITY): Payer: Medicare Other

## 2020-09-27 ENCOUNTER — Encounter (HOSPITAL_COMMUNITY): Payer: Self-pay

## 2020-09-27 ENCOUNTER — Telehealth: Payer: Self-pay

## 2020-09-27 DIAGNOSIS — R319 Hematuria, unspecified: Secondary | ICD-10-CM | POA: Diagnosis not present

## 2020-09-27 DIAGNOSIS — R310 Gross hematuria: Secondary | ICD-10-CM | POA: Insufficient documentation

## 2020-09-27 DIAGNOSIS — Z7982 Long term (current) use of aspirin: Secondary | ICD-10-CM | POA: Insufficient documentation

## 2020-09-27 DIAGNOSIS — E039 Hypothyroidism, unspecified: Secondary | ICD-10-CM | POA: Insufficient documentation

## 2020-09-27 DIAGNOSIS — I5022 Chronic systolic (congestive) heart failure: Secondary | ICD-10-CM | POA: Insufficient documentation

## 2020-09-27 DIAGNOSIS — Z79899 Other long term (current) drug therapy: Secondary | ICD-10-CM | POA: Diagnosis not present

## 2020-09-27 DIAGNOSIS — Z981 Arthrodesis status: Secondary | ICD-10-CM | POA: Diagnosis not present

## 2020-09-27 DIAGNOSIS — Z85820 Personal history of malignant melanoma of skin: Secondary | ICD-10-CM | POA: Diagnosis not present

## 2020-09-27 DIAGNOSIS — I110 Hypertensive heart disease with heart failure: Secondary | ICD-10-CM | POA: Insufficient documentation

## 2020-09-27 DIAGNOSIS — I700 Atherosclerosis of aorta: Secondary | ICD-10-CM | POA: Diagnosis not present

## 2020-09-27 LAB — CBC WITH DIFFERENTIAL/PLATELET
Abs Immature Granulocytes: 0.02 10*3/uL (ref 0.00–0.07)
Basophils Absolute: 0 10*3/uL (ref 0.0–0.1)
Basophils Relative: 0 %
Eosinophils Absolute: 0.1 10*3/uL (ref 0.0–0.5)
Eosinophils Relative: 1 %
HCT: 36.4 % — ABNORMAL LOW (ref 39.0–52.0)
Hemoglobin: 11.7 g/dL — ABNORMAL LOW (ref 13.0–17.0)
Immature Granulocytes: 0 %
Lymphocytes Relative: 13 %
Lymphs Abs: 0.8 10*3/uL (ref 0.7–4.0)
MCH: 31.3 pg (ref 26.0–34.0)
MCHC: 32.1 g/dL (ref 30.0–36.0)
MCV: 97.3 fL (ref 80.0–100.0)
Monocytes Absolute: 0.7 10*3/uL (ref 0.1–1.0)
Monocytes Relative: 11 %
Neutro Abs: 4.6 10*3/uL (ref 1.7–7.7)
Neutrophils Relative %: 75 %
Platelets: 201 10*3/uL (ref 150–400)
RBC: 3.74 MIL/uL — ABNORMAL LOW (ref 4.22–5.81)
RDW: 13.1 % (ref 11.5–15.5)
WBC: 6.2 10*3/uL (ref 4.0–10.5)
nRBC: 0 % (ref 0.0–0.2)

## 2020-09-27 LAB — URINALYSIS, ROUTINE W REFLEX MICROSCOPIC
Bacteria, UA: NONE SEEN
Bilirubin Urine: NEGATIVE
Glucose, UA: NEGATIVE mg/dL
Ketones, ur: NEGATIVE mg/dL
Leukocytes,Ua: NEGATIVE
Nitrite: NEGATIVE
Protein, ur: NEGATIVE mg/dL
Specific Gravity, Urine: 1.014 (ref 1.005–1.030)
pH: 5 (ref 5.0–8.0)

## 2020-09-27 LAB — BASIC METABOLIC PANEL
Anion gap: 8 (ref 5–15)
BUN: 20 mg/dL (ref 8–23)
CO2: 28 mmol/L (ref 22–32)
Calcium: 9.8 mg/dL (ref 8.9–10.3)
Chloride: 101 mmol/L (ref 98–111)
Creatinine, Ser: 1.13 mg/dL (ref 0.61–1.24)
GFR, Estimated: >60 mL/min (ref >60)
Glucose, Bld: 95 mg/dL (ref 70–99)
Potassium: 4.7 mmol/L (ref 3.5–5.1)
Sodium: 137 mmol/L (ref 135–145)

## 2020-09-27 NOTE — ED Notes (Signed)
Pt ambulated to restroom and back to room with minimal assistance.

## 2020-09-27 NOTE — Telephone Encounter (Signed)
Left voice mail to call back 

## 2020-09-27 NOTE — Discharge Instructions (Addendum)
The work-up today was overall reassuring.  There was a small amount of microscopic blood in the urine.  The kidney function was normal with a creatinine of 1.13. We recommend follow-up with urology on this matter.  Call to make an appointment.  Return to the emergency department for onset of fever with your symptoms, abdominal pain, uncontrolled vomiting, chest pain, persistent bleeding, or any other major concerns.

## 2020-09-27 NOTE — ED Provider Notes (Signed)
[facility] DEPT Provider Note   CSN: [id number] Arrival date & time: 09/27/20  1002     History Chief Complaint  Patient presents with  . Hematuria    Corey Jordan is a 86 y.o. male.  HPI      Corey Jordan is a 86 y.o. male, with a history of A. fib, anxiety, HTN, hyperlipidemia, presenting to the ED with frank hematuria noted upon first urination following waking.  This occurred around 7 AM this morning and reportedly consisted of liquid blood mixed with the urine without noted clots.  Upon subsequent urination, he did not note any hematuria or other abnormalities.  Denies true anticoagulation. Denies fever/chills, nausea/vomiting, abdominal pain, dysuria, chest pain, shortness of breath, dizziness, syncope, difficulty urinating, urinary frequency, genital pain, or any other complaints.  Past Medical History:  Diagnosis Date  . Anxiety   . Atrial fibrillation ([place])   . Cardiac defibrillator in situ    s/p MDT Maximo VR single lead ICD - 07/06/2006  . 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  . Degenerative joint disease   . DEGENERATIVE JOINT DISEASE 04/09/2009   Qualifier: Diagnosis of  By: Darrick Meigs    . Depression   . GE reflux 09/19/2011  . H/O hiatal hernia    times 3  . History of orthostatic hypotension   . Hyperlipidemia   . Hypertension   . Hypothyroidism   . Ischemic cardiomyopathy    EF 40% 12/2008 V gram;  Echocardiogram 11/08/11: EF 30-35%, inferoseptal and anteroseptal, apical septal/lateral/anterior/inferior and true apical akinesis, no LV thrombus, grade 1 diastolic dysfunction.  . Lumbar disc disease 09/19/2011  . Lumbar  spondylosis    s/p L5-S1 fusion 12/11/2009  . NSVT (nonsustained ventricular tachycardia) ([place]) 06/23/2019  . Premature atrial contractions   . Premature ventricular contractions   . Skin cancer    skin  . Systolic CHF, chronic [facility])     Patient Active Problem List   Diagnosis Date Noted  . Porokeratosis 07/24/2020  . NSVT (nonsustained ventricular tachycardia) ([place]) 06/23/2019  . Neck pain 02/15/2018  . Impaired glucose tolerance 05/22/2015  . Premature atrial contractions   . Premature ventricular contractions   . Systolic CHF, chronic ([place])   . Lumbar spondylosis   . Coronary artery disease   . Cardiac defibrillator in situ   . Hyperlipidemia   . Hypertension   . Ischemic cardiomyopathy   . Hypothyroidism 09/19/2011  . Lumbar disc disease 09/19/2011  . DEGENERATIVE JOINT DISEASE 04/09/2009    Past Surgical History:  Procedure Laterality Date  . BACK SURGERY     x 2  . EP IMPLANTABLE DEVICE [name] 04/25/2015   Procedure: ICD Generator Changeout;  Surgeon: Evans Lance, MD;  Location: [facility] CV LAB;  Service: Cardiovascular;  Laterality: [name];  . icd     s/p MDT Maximo VR single lead ICD - 07/06/2006  . INGUINAL HERNIA REPAIR Left    x 3  . LEFT HEART CATHETERIZATION WITH CORONARY ANGIOGRAM [name] 11/09/2011   Procedure: LEFT HEART CATHETERIZATION WITH CORONARY ANGIOGRAM;  Surgeon: Josue Hector, MD;  Location: [facility] CATH LAB;  Service: Cardiovascular;  Laterality: [name];  .  LUMBAR FUSION     s/p L5-S1 fusion 12/11/2009  . MOHS SURGERY     x 3, head nose  . SKIN CANCER EXCISION         Family History  Problem Relation Age of Onset  . Peripheral vascular disease Father        deceased 62  . Pneumonia Mother        deceased 101  . Prostate cancer Brother   . Diabetes Brother   . Heart disease Brother   . Cancer Brother        unknown type    Social History   Tobacco Use  . Smoking status: Never Smoker  . Smokeless tobacco: Never Used  Vaping Use  . Vaping  Use: Never used  Substance Use Topics  . Alcohol use: No  . Drug use: No    Home Medications Prior to Admission medications   Medication Sig Start Date End Date Taking? Authorizing Provider  aspirin 81 MG EC tablet Take 81 mg by mouth daily.      [provider]  atorvastatin (LIPITOR) 40 MG tablet TAKE 1 TABLET BY MOUTH DAILY 07/30/20   Lelon Perla, MD  calcium carbonate (OS-CAL) 600 MG TABS Take 600 mg by mouth daily.     [provider]  carvedilol (COREG) 6.25 MG tablet TAKE 1 TABLET(6.25 MG) BY MOUTH TWICE DAILY WITH A MEAL 07/31/20   Evans Lance, MD  cholecalciferol (VITAMIN D) 1000 UNITS tablet Take 1,000 Units by mouth daily.     [provider]  levothyroxine (SYNTHROID) 50 MCG tablet TAKE 1 TABLET(50 MCG) BY MOUTH EVERY MORNING 11/26/19   Elby Showers, MD  nitroGLYCERIN (NITROSTAT) 0.4 MG SL tablet PLACE 1 TABLET UNDER THE TONGUE IF NEEDED EVERY 5 MINUTES FOR CHEST PAIN FOR 3 DOSES IF NO RELIEF AFTER FIRST DOSE CALL PRESCRIBER OR 911 11/14/18   Lelon Perla, MD  ramipril (ALTACE) 5 MG capsule Take 1 capsule (5 mg total) by mouth daily. 08/05/20   Lelon Perla, MD  sertraline (ZOLOFT) 25 MG tablet Take one tablet daily. If tolerating, increase to two tablets in one week. 09/09/20   Nicolette Bang, DO  spironolactone (ALDACTONE) 25 MG tablet TAKE 1/2 TABLET (=12.5MG    TOTAL) DAILY 08/14/20   Evans Lance, MD    Allergies    Pollen extract  Review of Systems   Review of Systems  Constitutional: Negative for chills, diaphoresis and fever.  Respiratory: Negative for shortness of breath.   Cardiovascular: Negative for chest pain.  Gastrointestinal: Negative for abdominal pain, diarrhea, nausea and vomiting.  Genitourinary: Positive for hematuria. Negative for decreased urine volume, difficulty urinating, dysuria, flank pain, frequency, scrotal swelling and testicular pain.  Musculoskeletal: Negative for back pain.   Neurological: Negative for dizziness, syncope and weakness.  All other systems reviewed and are negative.   Physical Exam Updated Vital Signs BP (!) 143/90 (BP Location: Right Arm)   Pulse 67   Temp (!) 97.5 F (36.4 C) (Oral)   Resp 18   SpO2 100%   Physical Exam Vitals and nursing note reviewed.  Constitutional:      General: He is not in acute distress.    Appearance: He is well-developed. He is not diaphoretic.  HENT:     Head: Normocephalic and atraumatic.     Mouth/Throat:     Mouth: Mucous membranes are moist.     Pharynx: Oropharynx is clear.  Eyes:  Conjunctiva/sclera: Conjunctivae normal.  Cardiovascular:     Rate and Rhythm: Normal rate and regular rhythm.     Pulses: Normal pulses.          Radial pulses are 2+ on the right side and 2+ on the left side.     Comments: Tactile temperature in the extremities appropriate and equal bilaterally. Pulmonary:     Effort: Pulmonary effort is normal. No respiratory distress.  Abdominal:     Palpations: Abdomen is soft.     Tenderness: There is no abdominal tenderness. There is no guarding.  Genitourinary:    Comments: Genital Exam: Penis, scrotum, and testicles without swelling, lesions, or tenderness. No penile discharge.  No active hemorrhage or evidence of bleeding. No inguinal lymphadenopathy.  Overall normal male genitalia.  Musculoskeletal:     Cervical back: Neck supple.  Skin:    General: Skin is warm and dry.  Neurological:     Mental Status: He is alert.  Psychiatric:        Mood and Affect: Mood and affect normal.        Speech: Speech normal.        Behavior: Behavior normal.     ED Results / Procedures / Treatments   Labs (all labs ordered are listed, but only abnormal results are displayed) Labs Reviewed  CBC WITH DIFFERENTIAL/PLATELET - Abnormal; Notable for the following components:      Result Value   RBC 3.74 (*)    Hemoglobin 11.7 (*)    HCT 36.4 (*)    All other components within  normal limits  URINALYSIS, ROUTINE W REFLEX MICROSCOPIC - Abnormal; Notable for the following components:   Hgb urine dipstick MODERATE (*)    All other components within normal limits  URINE CULTURE  BASIC METABOLIC PANEL     EKG None  Radiology CT Renal Stone Study  Result Date: 09/27/2020 CLINICAL DATA:  Hematuria. EXAM: CT ABDOMEN AND PELVIS WITHOUT CONTRAST TECHNIQUE: Multidetector CT imaging of the abdomen and pelvis was performed following the standard protocol without IV contrast. COMPARISON:  None. FINDINGS: Lower chest: Lung bases are clear. Hepatobiliary: 2.3 cm low-density lesion in the RIGHT hepatic lobe consistent with cyst. Several additional smaller lesions within the liver also favored benign cysts. Pancreas: Pancreas is normal. No ductal dilatation. No pancreatic inflammation. Spleen: Normal spleen Adrenals/urinary tract: Adrenal glands are normal. No nephrolithiasis or ureterolithiasis. No bladder calculi. Stomach/Bowel: Stomach, small-bowel and cecum are normal. The appendix is not identified but there is no pericecal inflammation to suggest appendicitis. The colon and rectosigmoid colon are normal. Vascular/Lymphatic: Abdominal aorta is normal caliber with atherosclerotic calcification. There is no retroperitoneal or periportal lymphadenopathy. No pelvic lymphadenopathy. Reproductive: Prostate unremarkable Other: No free fluid. Musculoskeletal: Posterior lumbar fusion. No aggressive osseous lesion IMPRESSION: 1. No nephrolithiasis, ureterolithiasis or obstructive uropathy. 2. No bladder calculi. 3.  Aortic Atherosclerosis (ICD10-I70.0). Electronically Signed   By: Suzy Bouchard M.D.   On: 09/27/2020 13:19    Procedures Procedures (including critical care time)  Medications Ordered in ED Medications - No data to display  ED Course  I have reviewed the triage vital signs and the nursing notes.  Pertinent labs & imaging results that were available during my care of the  patient were reviewed by me and considered in my medical decision making (see chart for details).  Clinical Course as of Sep 27 1417  Fri Sep 27, 2020  1330 Attempted to call the patient's Corinne Ports, to let him know that  the patient is ready to be picked up.  There was no answer and no voicemail picked up.   [SJ]  46 86 yo male here with painless hematuria since earlier today.  CT scan with no stone or visible mass (ordered as a stone study).  UA with no evidence of infection.  Hgb stable.  He's not on A/C.  He has no symptoms of anemia.  Okay for discharge with urology f/u - he may need cystoscopy for cancer evaluation.     [MT]  1350 Attempted to call patient's other stepson, Sam. No answer. Left VM.   [SJ]    Clinical Course User Index [MT] Trifan, Carola Rhine, MD [SJ] Layla Maw   MDM Rules/Calculators/A&P                          Patient presents with an episode of frank hematuria.  No recurrence of this upon subsequent urination. Patient is nontoxic appearing, afebrile, not tachycardic, not tachypneic, not hypotensive, maintains excellent SPO2 on room air, and is in no apparent distress.   I have reviewed the patient's chart to obtain more information.   I reviewed and interpreted the patient's labs and radiological studies. Hemoglobin is at an acceptable level.  Moderate hemoglobin on UA without frank hematuria.  Abdominal exam benign. CT without acute abnormality. Recommended urology follow-up for further assessment. The patient was given instructions for home care as well as return precautions. Patient voices understanding of these instructions, accepts the plan, and is comfortable with discharge.  Findings and plan of care discussed with Myrtie Cruise, MD. Dr. Langston Masker personally evaluated and examined this patient.  Vitals:   09/27/20 1012 09/27/20 1252 09/27/20 1300 09/27/20 1330  BP: (!) 143/90 (!) 122/57 (!) 112/54 128/86  Pulse: 67 (!) 56 (!) 53 62  Resp: 18  15 12  (!) 23  Temp: (!) 97.5 F (36.4 C)     TempSrc: Oral     SpO2: 100% 99% 95% 98%    Final Clinical Impression(s) / ED Diagnoses Final diagnoses:  Gross hematuria    Rx / DC Orders ED Discharge Orders    None       Layla Maw 09/27/20 1420    Wyvonnia Dusky, MD 09/27/20 1425

## 2020-09-27 NOTE — ED Notes (Signed)
Delay in discharge due to waiting on family to return around 1430 to pick up patient.

## 2020-09-27 NOTE — ED Triage Notes (Signed)
Pt presents with c/o hematuria. Pt reports that started this morning, hx of decreased kidney function.

## 2020-09-27 NOTE — Telephone Encounter (Signed)
-----   Message from Nicolette Bang, DO sent at 09/26/2020  8:57 AM EDT ----- Regarding: FW: Medication Side Effects Would you please call patient and let him know I recommend stopping Zoloft due to his persistent side effects for >2 weeks despite taking medication at different times of day?   Thanks,  Phill Myron, D.O. Primary Care at [facility]  09/26/2020, 8:57 AM    ----- Message ----- From: Rebekah Chesterfield, LCSW Sent: 09/25/2020   4:42 PM EDT To: Nicolette Bang, DO Subject: Medication Side Effects                        Hi Dr. Juleen China,   I routed you my note regarding patient during a follow up visit scheduled yesterday. Please see my note regarding side effects to prescribed medication. Thanks.

## 2020-09-28 LAB — URINE CULTURE: Culture: NO GROWTH

## 2020-09-30 NOTE — BH Specialist Note (Signed)
Integrated Behavioral Health Visit via Telemedicine (Telephone)  09/10/2020 Corey Jordan [id number]  Number of [place] visits: 1 Session Start time: 1:45 PM  Session End time: 2:05 PM Total time: 20 minutes  Referring Provider: Dr. Juleen China Type of Service: Individual Patient location: Home [facility] Provider location: Office All persons participating in visit: LCSW and Patient   I connected with Corey Jordan by telephone and verified that I am speaking with the correct person using two identifiers.   Discussed confidentiality: Yes   Confirmed demographics & insurance:  Yes   I discussed that engaging in this virtual visit, they consent to the provision of behavioral healthcare and the services will be billed under their insurance.   Patient and/or legal guardian expressed understanding and consented to virtual visit: Yes   PRESENTING CONCERNS: Patient or family reports the following symptoms/concerns: Pt reports increase in depression symptoms triggered by caregiver stress and management of ongoing medical conditions Duration of problem: couple of weeks; Severity of problem: moderate  STRENGTHS (Protective Factors/Coping Skills): Social connections, Social and Emotional competence, Concrete supports in place (healthy food, safe environments, etc.) and Sense of purpose  ASSESSMENT: Patient currently experiencing difficulty managing increase in depression symptoms.   Pt will benefit from medication management and therapy.    GOALS ADDRESSED: Patient will: 1.  Reduce symptoms of: depression Pt agreed to pick up medications prescribed by PCP today and take as prescribed  2.  Increase knowledge and/or ability of: coping skills Pt agreed to utilize healthy coping skills identified in session  Progress of Goals: Ongoing  INTERVENTIONS: Interventions utilized:  Solution-Focused Strategies, Supportive Counseling and Psychoeducation and/or Health  Education Standardized Assessments completed & reviewed: Not Needed   OUTCOME: Patient Response: Pt was engaged during session and was successful in identifying healthy coping skills that assist in management of stressors.    PLAN: 1. Follow up with behavioral health clinician on : 09/24/2020 2. Behavioral recommendations: Utilize strategies discussed and comply with medication management 3. Referral(s): [place] (In Clinic)  I discussed the assessment and treatment plan with the patient and/or parent/guardian. They were provided an opportunity to ask questions and all were answered. They agreed with the plan and demonstrated an understanding of the instructions.   They were advised to call back or seek an in-person evaluation as appropriate.  I discussed that the purpose of this visit is to provide behavioral health care while limiting exposure to the novel coronavirus.  Discussed there is a possibility of technology failure and discussed alternative modes of communication if that failure occurs.  Rebekah Chesterfield, LCSW 09/30/2020 1:06 AM

## 2020-10-04 NOTE — Telephone Encounter (Signed)
Left voice mail to call back 

## 2020-10-14 ENCOUNTER — Encounter: Payer: Self-pay | Admitting: Internal Medicine

## 2020-10-14 ENCOUNTER — Telehealth (INDEPENDENT_AMBULATORY_CARE_PROVIDER_SITE_OTHER): Payer: Medicare Other | Admitting: Internal Medicine

## 2020-10-14 DIAGNOSIS — F4323 Adjustment disorder with mixed anxiety and depressed mood: Secondary | ICD-10-CM

## 2020-10-14 MED ORDER — LEVOTHYROXINE SODIUM 50 MCG PO TABS: ORAL_TABLET | ORAL | 3 refills | Status: AC

## 2020-10-14 NOTE — Progress Notes (Signed)
Virtual Visit via Telephone Note  I connected with Corey Jordan, on 10/14/2020 at 2:38 PM by telephone due to the COVID-19 pandemic and verified that I am speaking with the correct person using two identifiers.   Consent: I discussed the limitations, risks, security and privacy concerns of performing an evaluation and management service by telephone and the availability of in person appointments. I also discussed with the patient that there may be a patient responsible charge related to this service. The patient expressed understanding and agreed to proceed.   Location of Patient: Home   Location of Provider: Clinic    Persons participating in Telemedicine visit: Xxavier Noon [facility] Dr. Juleen China      History of Present Illness: Patient has a visit to follow up on anxiety and depression. Since last visit, he stopped taking the Zoloft because of side effects. Side effects resolved. Reports that he feels fine with his mood. He doesn't want to try any other medications and wants to put it into "Gods' hands and keep on going". He says the same thing about follow up with Christa See, LCSW.   Depression screen [facility] 2/9 10/14/2020 09/06/2020 10/27/2018  Decreased Interest 1 0 1  Down, Depressed, Hopeless 0 0 1  PHQ - 2 Score 1 0 2  Altered sleeping 1 0 -  Tired, decreased energy 2 0 -  Change in appetite 0 0 -  Feeling bad or failure about yourself  0 0 -  Trouble concentrating 0 0 -  Moving slowly or fidgety/restless 0 0 -  Suicidal thoughts 0 0 -  PHQ-9 Score 4 0 -  Some recent data might be hidden     Past Medical History:  Diagnosis Date  . Anxiety   . Atrial fibrillation ([place])   . Cardiac defibrillator in situ    s/p MDT Maximo VR single lead ICD - 07/06/2006  . 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  . Degenerative joint disease   . DEGENERATIVE JOINT DISEASE 04/09/2009   Qualifier: Diagnosis of  By: Darrick Meigs    . Depression   . GE reflux 09/19/2011  . H/O hiatal hernia    times 3  . History of orthostatic hypotension   . Hyperlipidemia   . Hypertension   . Hypothyroidism   . Ischemic cardiomyopathy    EF 40% 12/2008 V gram;  Echocardiogram 11/08/11: EF 30-35%, inferoseptal and anteroseptal, apical septal/lateral/anterior/inferior and true apical akinesis, no LV thrombus, grade 1 diastolic dysfunction.  . Lumbar disc disease 09/19/2011  . Lumbar spondylosis    s/p L5-S1 fusion 12/11/2009  . NSVT (nonsustained ventricular tachycardia) ([place]) 06/23/2019  . Premature atrial contractions   . Premature ventricular contractions   . Skin cancer    skin  . Systolic CHF, chronic ([facility])    Allergies  Allergen Reactions  . Pollen Extract Other (See Comments)    Sneezing and watery eyes    Current Outpatient Medications on File Prior to Visit  Medication Sig Dispense Refill  . aspirin 81 MG EC tablet Take 81 mg by mouth daily.      Marland Kitchen atorvastatin (LIPITOR) 40 MG tablet TAKE 1 TABLET BY MOUTH DAILY  90 tablet 3  . calcium carbonate (OS-CAL) 600 MG TABS Take 600 mg by mouth daily.     . carvedilol (COREG) 6.25 MG tablet TAKE 1 TABLET(6.25 MG) BY MOUTH TWICE DAILY WITH A MEAL 180 tablet 3  . cholecalciferol (VITAMIN D) 1000 UNITS tablet Take 1,000 Units by mouth daily.     Marland Kitchen levothyroxine (SYNTHROID) 50 MCG tablet TAKE 1 TABLET(50 MCG) BY MOUTH EVERY MORNING 90 tablet 3  . nitroGLYCERIN (NITROSTAT) 0.4 MG SL tablet PLACE 1 TABLET UNDER THE TONGUE IF NEEDED EVERY 5 MINUTES FOR CHEST PAIN FOR 3 DOSES IF NO RELIEF AFTER FIRST DOSE CALL PRESCRIBER OR 911 25 tablet 6  . ramipril (ALTACE) 5 MG capsule Take 1 capsule (5 mg total) by mouth daily. 90 capsule 3  .  spironolactone (ALDACTONE) 25 MG tablet TAKE 1/2 TABLET (=12.5MG    TOTAL) DAILY 45 tablet 3   No current facility-administered medications on file prior to visit.    Observations/Objective: NAD. Speaking clearly.  Work of breathing normal.  Alert and oriented. Mood appropriate.   Assessment and Plan: 1. Adjustment disorder with mixed anxiety and depressed mood Improved. Self discontinued Zoloft due to side effects. Declines trial of other medications or therapy. Will continue to monitor.    Follow Up Instructions: PRN and for routine medical care    I discussed the assessment and treatment plan with the patient. The patient was provided an opportunity to ask questions and all were answered. The patient agreed with the plan and demonstrated an understanding of the instructions.   The patient was advised to call back or seek an in-person evaluation if the symptoms worsen or if the condition fails to improve as anticipated.     I provided 9 minutes total of non-face-to-face time during this encounter including median intraservice time, reviewing previous notes, investigations, ordering medications, medical decision making, coordinating care and patient verbalized understanding at the end of the visit.    Phill Myron, D.O. Primary Care at [facility]  10/14/2020, 2:38 PM

## 2020-10-30 ENCOUNTER — Ambulatory Visit (INDEPENDENT_AMBULATORY_CARE_PROVIDER_SITE_OTHER): Payer: Medicare Other | Admitting: Podiatry

## 2020-10-30 ENCOUNTER — Other Ambulatory Visit: Payer: Self-pay

## 2020-10-30 ENCOUNTER — Encounter: Payer: Self-pay | Admitting: Podiatry

## 2020-10-30 DIAGNOSIS — M79674 Pain in right toe(s): Secondary | ICD-10-CM

## 2020-10-30 DIAGNOSIS — Q828 Other specified congenital malformations of skin: Secondary | ICD-10-CM | POA: Diagnosis not present

## 2020-10-30 DIAGNOSIS — B351 Tinea unguium: Secondary | ICD-10-CM

## 2020-10-30 DIAGNOSIS — L84 Corns and callosities: Secondary | ICD-10-CM

## 2020-10-30 DIAGNOSIS — M79675 Pain in left toe(s): Secondary | ICD-10-CM | POA: Diagnosis not present

## 2020-10-30 NOTE — Progress Notes (Signed)
This patient returns to the office for evaluation and treatment of long thick painful nails .  This patient is unable to trim his own nails since the patient cannot reach his feet.  Patient says the nails are painful walking and wearing his shoes.  He says he has pain in the callus under his big toe joint left foot.  He returns for preventive foot care services.  General Appearance  Alert, conversant and in no acute stress.  Vascular  Dorsalis pedis and posterior tibial  pulses are palpable  bilaterally.  Capillary return is within normal limits  bilaterally. Temperature is within normal limits  bilaterally.  Neurologic  Senn-Weinstein monofilament wire test within normal limits  bilaterally. Muscle power within normal limits bilaterally.  Nails Thick disfigured discolored nails with subungual debris  from hallux to fifth toes bilaterally. No evidence of bacterial infection or drainage bilaterally.  Subungual hematoma dried second toe left foot.    Orthopedic  No limitations of motion  feet .  No crepitus or effusions noted.  No bony pathology or digital deformities noted.  Skin  normotropic skin with no porokeratosis noted bilaterally.  No signs of infections or ulcers noted.   Callus sub 1 left foot.  Onychomycosis  Pain in toes right foot  Pain in toes left foot  Porokeratosis sub 1 left foot.  Debridement  of nails  1-5  B/L with a nail nipper.  Nails were then filed using a dremel tool with no incidents.  Debride callus sub 1st MPJ left foot with # 15 blade.  Call the office if problem occurs.  RTC  3 months.   Gardiner Barefoot DPM

## 2020-11-25 ENCOUNTER — Ambulatory Visit (INDEPENDENT_AMBULATORY_CARE_PROVIDER_SITE_OTHER): Payer: Medicare Other

## 2020-11-25 DIAGNOSIS — I255 Ischemic cardiomyopathy: Secondary | ICD-10-CM | POA: Diagnosis not present

## 2020-11-26 LAB — CUP PACEART REMOTE DEVICE CHECK
Battery Remaining Longevity: 61 mo
Battery Voltage: 2.99 V
Brady Statistic RV Percent Paced: 0.17 %
Date Time Interrogation Session: 20220103033323
HighPow Impedance: 41 Ohm
HighPow Impedance: 65 Ohm
Implantable Lead Implant Date: 20070814
Implantable Lead Location: 753860
Implantable Lead Model: 6947
Implantable Pulse Generator Implant Date: 20160602
Lead Channel Impedance Value: 456 Ohm
Lead Channel Impedance Value: 532 Ohm
Lead Channel Pacing Threshold Amplitude: 0.875 V
Lead Channel Pacing Threshold Pulse Width: 0.4 ms
Lead Channel Sensing Intrinsic Amplitude: 22.375 mV
Lead Channel Sensing Intrinsic Amplitude: 22.375 mV
Lead Channel Setting Pacing Amplitude: 2.5 V
Lead Channel Setting Pacing Pulse Width: 0.4 ms
Lead Channel Setting Sensing Sensitivity: 0.3 mV

## 2020-12-10 NOTE — Progress Notes (Signed)
Remote ICD transmission.   

## 2021-01-08 ENCOUNTER — Emergency Department (HOSPITAL_COMMUNITY): Payer: Medicare Other

## 2021-01-08 ENCOUNTER — Inpatient Hospital Stay (HOSPITAL_COMMUNITY)
Admission: EM | Admit: 2021-01-08 | Discharge: 2021-01-25 | DRG: 957 | Disposition: A | Discharge status: Skilled Nursing Facility | Payer: Medicare Other | Attending: General Surgery | Admitting: General Surgery

## 2021-01-08 ENCOUNTER — Inpatient Hospital Stay (HOSPITAL_COMMUNITY): Payer: Medicare Other

## 2021-01-08 ENCOUNTER — Other Ambulatory Visit: Payer: Self-pay

## 2021-01-08 ENCOUNTER — Encounter (HOSPITAL_COMMUNITY): Payer: Self-pay

## 2021-01-08 DIAGNOSIS — J9690 Respiratory failure, unspecified, unspecified whether with hypoxia or hypercapnia: Secondary | ICD-10-CM

## 2021-01-08 DIAGNOSIS — I110 Hypertensive heart disease with heart failure: Secondary | ICD-10-CM | POA: Diagnosis present

## 2021-01-08 DIAGNOSIS — T1490XA Injury, unspecified, initial encounter: Secondary | ICD-10-CM | POA: Diagnosis not present

## 2021-01-08 DIAGNOSIS — I2510 Atherosclerotic heart disease of native coronary artery without angina pectoris: Secondary | ICD-10-CM | POA: Diagnosis present

## 2021-01-08 DIAGNOSIS — K449 Diaphragmatic hernia without obstruction or gangrene: Secondary | ICD-10-CM | POA: Diagnosis not present

## 2021-01-08 DIAGNOSIS — Z7189 Other specified counseling: Secondary | ICD-10-CM | POA: Diagnosis not present

## 2021-01-08 DIAGNOSIS — Z955 Presence of coronary angioplasty implant and graft: Secondary | ICD-10-CM

## 2021-01-08 DIAGNOSIS — J9811 Atelectasis: Secondary | ICD-10-CM | POA: Diagnosis not present

## 2021-01-08 DIAGNOSIS — S32810A Multiple fractures of pelvis with stable disruption of pelvic ring, initial encounter for closed fracture: Secondary | ICD-10-CM

## 2021-01-08 DIAGNOSIS — S32591A Other specified fracture of right pubis, initial encounter for closed fracture: Secondary | ICD-10-CM | POA: Diagnosis not present

## 2021-01-08 DIAGNOSIS — M40292 Other kyphosis, cervical region: Secondary | ICD-10-CM | POA: Diagnosis not present

## 2021-01-08 DIAGNOSIS — S32501A Unspecified fracture of right pubis, initial encounter for closed fracture: Secondary | ICD-10-CM | POA: Diagnosis not present

## 2021-01-08 DIAGNOSIS — R54 Age-related physical debility: Secondary | ICD-10-CM | POA: Diagnosis present

## 2021-01-08 DIAGNOSIS — W2209XA Striking against other stationary object, initial encounter: Secondary | ICD-10-CM | POA: Diagnosis present

## 2021-01-08 DIAGNOSIS — Y92410 Unspecified street and highway as the place of occurrence of the external cause: Secondary | ICD-10-CM

## 2021-01-08 DIAGNOSIS — Z981 Arthrodesis status: Secondary | ICD-10-CM | POA: Diagnosis not present

## 2021-01-08 DIAGNOSIS — S12500A Unspecified displaced fracture of sixth cervical vertebra, initial encounter for closed fracture: Secondary | ICD-10-CM | POA: Diagnosis not present

## 2021-01-08 DIAGNOSIS — M4322 Fusion of spine, cervical region: Secondary | ICD-10-CM | POA: Diagnosis not present

## 2021-01-08 DIAGNOSIS — I4891 Unspecified atrial fibrillation: Secondary | ICD-10-CM | POA: Diagnosis present

## 2021-01-08 DIAGNOSIS — E785 Hyperlipidemia, unspecified: Secondary | ICD-10-CM | POA: Diagnosis present

## 2021-01-08 DIAGNOSIS — Z66 Do not resuscitate: Secondary | ICD-10-CM | POA: Diagnosis present

## 2021-01-08 DIAGNOSIS — M452 Ankylosing spondylitis of cervical region: Secondary | ICD-10-CM | POA: Diagnosis present

## 2021-01-08 DIAGNOSIS — S12500D Unspecified displaced fracture of sixth cervical vertebra, subsequent encounter for fracture with routine healing: Secondary | ICD-10-CM | POA: Diagnosis not present

## 2021-01-08 DIAGNOSIS — Z515 Encounter for palliative care: Secondary | ICD-10-CM | POA: Diagnosis not present

## 2021-01-08 DIAGNOSIS — J90 Pleural effusion, not elsewhere classified: Secondary | ICD-10-CM

## 2021-01-08 DIAGNOSIS — I252 Old myocardial infarction: Secondary | ICD-10-CM

## 2021-01-08 DIAGNOSIS — S12600A Unspecified displaced fracture of seventh cervical vertebra, initial encounter for closed fracture: Secondary | ICD-10-CM | POA: Diagnosis not present

## 2021-01-08 DIAGNOSIS — R140 Abdominal distension (gaseous): Secondary | ICD-10-CM

## 2021-01-08 DIAGNOSIS — S32502A Unspecified fracture of left pubis, initial encounter for closed fracture: Secondary | ICD-10-CM | POA: Diagnosis not present

## 2021-01-08 DIAGNOSIS — S2249XA Multiple fractures of ribs, unspecified side, initial encounter for closed fracture: Secondary | ICD-10-CM

## 2021-01-08 DIAGNOSIS — I959 Hypotension, unspecified: Secondary | ICD-10-CM | POA: Diagnosis present

## 2021-01-08 DIAGNOSIS — J9601 Acute respiratory failure with hypoxia: Secondary | ICD-10-CM | POA: Diagnosis not present

## 2021-01-08 DIAGNOSIS — E039 Hypothyroidism, unspecified: Secondary | ICD-10-CM | POA: Diagnosis present

## 2021-01-08 DIAGNOSIS — S2241XA Multiple fractures of ribs, right side, initial encounter for closed fracture: Secondary | ICD-10-CM | POA: Diagnosis not present

## 2021-01-08 DIAGNOSIS — K567 Ileus, unspecified: Secondary | ICD-10-CM | POA: Diagnosis not present

## 2021-01-08 DIAGNOSIS — R0602 Shortness of breath: Secondary | ICD-10-CM

## 2021-01-08 DIAGNOSIS — S129XXA Fracture of neck, unspecified, initial encounter: Secondary | ICD-10-CM | POA: Diagnosis not present

## 2021-01-08 DIAGNOSIS — R102 Pelvic and perineal pain: Secondary | ICD-10-CM | POA: Diagnosis not present

## 2021-01-08 DIAGNOSIS — S32501D Unspecified fracture of right pubis, subsequent encounter for fracture with routine healing: Secondary | ICD-10-CM | POA: Diagnosis not present

## 2021-01-08 DIAGNOSIS — Z743 Need for continuous supervision: Secondary | ICD-10-CM | POA: Diagnosis not present

## 2021-01-08 DIAGNOSIS — S37892A Contusion of other urinary and pelvic organ, initial encounter: Secondary | ICD-10-CM | POA: Diagnosis not present

## 2021-01-08 DIAGNOSIS — I10 Essential (primary) hypertension: Secondary | ICD-10-CM | POA: Diagnosis not present

## 2021-01-08 DIAGNOSIS — R079 Chest pain, unspecified: Secondary | ICD-10-CM

## 2021-01-08 DIAGNOSIS — D62 Acute posthemorrhagic anemia: Secondary | ICD-10-CM | POA: Diagnosis present

## 2021-01-08 DIAGNOSIS — I517 Cardiomegaly: Secondary | ICD-10-CM | POA: Diagnosis not present

## 2021-01-08 DIAGNOSIS — M4812 Ankylosing hyperostosis [Forestier], cervical region: Secondary | ICD-10-CM | POA: Diagnosis present

## 2021-01-08 DIAGNOSIS — I255 Ischemic cardiomyopathy: Secondary | ICD-10-CM | POA: Diagnosis present

## 2021-01-08 DIAGNOSIS — I130 Hypertensive heart and chronic kidney disease with heart failure and stage 1 through stage 4 chronic kidney disease, or unspecified chronic kidney disease: Secondary | ICD-10-CM | POA: Diagnosis not present

## 2021-01-08 DIAGNOSIS — Z419 Encounter for procedure for purposes other than remedying health state, unspecified: Secondary | ICD-10-CM

## 2021-01-08 DIAGNOSIS — R5381 Other malaise: Secondary | ICD-10-CM | POA: Diagnosis not present

## 2021-01-08 DIAGNOSIS — R0902 Hypoxemia: Secondary | ICD-10-CM

## 2021-01-08 DIAGNOSIS — S3289XA Fracture of other parts of pelvis, initial encounter for closed fracture: Secondary | ICD-10-CM | POA: Diagnosis not present

## 2021-01-08 DIAGNOSIS — M6281 Muscle weakness (generalized): Secondary | ICD-10-CM | POA: Diagnosis not present

## 2021-01-08 DIAGNOSIS — Z833 Family history of diabetes mellitus: Secondary | ICD-10-CM

## 2021-01-08 DIAGNOSIS — L89323 Pressure ulcer of left buttock, stage 3: Secondary | ICD-10-CM | POA: Diagnosis not present

## 2021-01-08 DIAGNOSIS — S329XXA Fracture of unspecified parts of lumbosacral spine and pelvis, initial encounter for closed fracture: Secondary | ICD-10-CM

## 2021-01-08 DIAGNOSIS — Z95810 Presence of automatic (implantable) cardiac defibrillator: Secondary | ICD-10-CM

## 2021-01-08 DIAGNOSIS — S3282XA Multiple fractures of pelvis without disruption of pelvic ring, initial encounter for closed fracture: Secondary | ICD-10-CM | POA: Diagnosis not present

## 2021-01-08 DIAGNOSIS — K661 Hemoperitoneum: Secondary | ICD-10-CM | POA: Diagnosis not present

## 2021-01-08 DIAGNOSIS — Z041 Encounter for examination and observation following transport accident: Secondary | ICD-10-CM | POA: Diagnosis not present

## 2021-01-08 DIAGNOSIS — L89626 Pressure-induced deep tissue damage of left heel: Secondary | ICD-10-CM | POA: Diagnosis not present

## 2021-01-08 DIAGNOSIS — S3722XA Contusion of bladder, initial encounter: Secondary | ICD-10-CM | POA: Diagnosis not present

## 2021-01-08 DIAGNOSIS — N189 Chronic kidney disease, unspecified: Secondary | ICD-10-CM | POA: Diagnosis not present

## 2021-01-08 DIAGNOSIS — S0990XA Unspecified injury of head, initial encounter: Secondary | ICD-10-CM | POA: Diagnosis not present

## 2021-01-08 DIAGNOSIS — K6389 Other specified diseases of intestine: Secondary | ICD-10-CM | POA: Diagnosis not present

## 2021-01-08 DIAGNOSIS — D696 Thrombocytopenia, unspecified: Secondary | ICD-10-CM | POA: Diagnosis present

## 2021-01-08 DIAGNOSIS — S2241XD Multiple fractures of ribs, right side, subsequent encounter for fracture with routine healing: Secondary | ICD-10-CM | POA: Diagnosis not present

## 2021-01-08 DIAGNOSIS — I5022 Chronic systolic (congestive) heart failure: Secondary | ICD-10-CM | POA: Diagnosis present

## 2021-01-08 DIAGNOSIS — S32511A Fracture of superior rim of right pubis, initial encounter for closed fracture: Secondary | ICD-10-CM | POA: Diagnosis not present

## 2021-01-08 DIAGNOSIS — Z20822 Contact with and (suspected) exposure to covid-19: Secondary | ICD-10-CM | POA: Diagnosis not present

## 2021-01-08 DIAGNOSIS — Z7982 Long term (current) use of aspirin: Secondary | ICD-10-CM

## 2021-01-08 DIAGNOSIS — N179 Acute kidney failure, unspecified: Secondary | ICD-10-CM | POA: Diagnosis not present

## 2021-01-08 DIAGNOSIS — J9600 Acute respiratory failure, unspecified whether with hypoxia or hypercapnia: Secondary | ICD-10-CM | POA: Diagnosis not present

## 2021-01-08 DIAGNOSIS — D631 Anemia in chronic kidney disease: Secondary | ICD-10-CM | POA: Diagnosis not present

## 2021-01-08 DIAGNOSIS — S12400D Unspecified displaced fracture of fifth cervical vertebra, subsequent encounter for fracture with routine healing: Secondary | ICD-10-CM | POA: Diagnosis not present

## 2021-01-08 DIAGNOSIS — Z8042 Family history of malignant neoplasm of prostate: Secondary | ICD-10-CM

## 2021-01-08 DIAGNOSIS — E440 Moderate protein-calorie malnutrition: Secondary | ICD-10-CM | POA: Diagnosis not present

## 2021-01-08 DIAGNOSIS — R262 Difficulty in walking, not elsewhere classified: Secondary | ICD-10-CM | POA: Diagnosis not present

## 2021-01-08 DIAGNOSIS — S12590A Other displaced fracture of sixth cervical vertebra, initial encounter for closed fracture: Secondary | ICD-10-CM | POA: Diagnosis not present

## 2021-01-08 DIAGNOSIS — S3992XA Unspecified injury of lower back, initial encounter: Secondary | ICD-10-CM | POA: Diagnosis not present

## 2021-01-08 DIAGNOSIS — R279 Unspecified lack of coordination: Secondary | ICD-10-CM | POA: Diagnosis not present

## 2021-01-08 DIAGNOSIS — S12600D Unspecified displaced fracture of seventh cervical vertebra, subsequent encounter for fracture with routine healing: Secondary | ICD-10-CM | POA: Diagnosis not present

## 2021-01-08 DIAGNOSIS — S299XXA Unspecified injury of thorax, initial encounter: Secondary | ICD-10-CM | POA: Diagnosis not present

## 2021-01-08 DIAGNOSIS — Z8249 Family history of ischemic heart disease and other diseases of the circulatory system: Secondary | ICD-10-CM

## 2021-01-08 HISTORY — PX: IR EMBO ART  VEN HEMORR LYMPH EXTRAV  INC GUIDE ROADMAPPING: IMG5450

## 2021-01-08 HISTORY — PX: IR US GUIDE VASC ACCESS RIGHT: IMG2390

## 2021-01-08 HISTORY — PX: IR ANGIOGRAM PELVIS SELECTIVE OR SUPRASELECTIVE: IMG661

## 2021-01-08 HISTORY — PX: IR ANGIOGRAM SELECTIVE EACH ADDITIONAL VESSEL: IMG667

## 2021-01-08 LAB — URINALYSIS, ROUTINE W REFLEX MICROSCOPIC
Bilirubin Urine: NEGATIVE
Glucose, UA: NEGATIVE mg/dL
Ketones, ur: NEGATIVE mg/dL
Leukocytes,Ua: NEGATIVE
Nitrite: NEGATIVE
Protein, ur: NEGATIVE mg/dL
Specific Gravity, Urine: 1.031 — ABNORMAL HIGH (ref 1.005–1.030)
pH: 5 (ref 5.0–8.0)

## 2021-01-08 LAB — I-STAT CHEM 8, ED
BUN: 29 mg/dL — ABNORMAL HIGH (ref 8–23)
Calcium, Ion: 1.22 mmol/L (ref 1.15–1.40)
Chloride: 100 mmol/L (ref 98–111)
Creatinine, Ser: 1.7 mg/dL — ABNORMAL HIGH (ref 0.61–1.24)
Glucose, Bld: 160 mg/dL — ABNORMAL HIGH (ref 70–99)
HCT: 32 % — ABNORMAL LOW (ref 39.0–52.0)
Hemoglobin: 10.9 g/dL — ABNORMAL LOW (ref 13.0–17.0)
Potassium: 4.6 mmol/L (ref 3.5–5.1)
Sodium: 136 mmol/L (ref 135–145)
TCO2: 26 mmol/L (ref 22–32)

## 2021-01-08 LAB — CBC
HCT: 34 % — ABNORMAL LOW (ref 39.0–52.0)
Hemoglobin: 10.5 g/dL — ABNORMAL LOW (ref 13.0–17.0)
MCH: 30.9 pg (ref 26.0–34.0)
MCHC: 30.9 g/dL (ref 30.0–36.0)
MCV: 100 fL (ref 80.0–100.0)
Platelets: 217 10*3/uL (ref 150–400)
RBC: 3.4 MIL/uL — ABNORMAL LOW (ref 4.22–5.81)
RDW: 12.8 % (ref 11.5–15.5)
WBC: 10.9 10*3/uL — ABNORMAL HIGH (ref 4.0–10.5)
nRBC: 0 % (ref 0.0–0.2)

## 2021-01-08 LAB — COMPREHENSIVE METABOLIC PANEL
ALT: 46 U/L — ABNORMAL HIGH (ref 0–44)
AST: 68 U/L — ABNORMAL HIGH (ref 15–41)
Albumin: 3.5 g/dL (ref 3.5–5.0)
Alkaline Phosphatase: 58 U/L (ref 38–126)
Anion gap: 11 (ref 5–15)
BUN: 26 mg/dL — ABNORMAL HIGH (ref 8–23)
CO2: 25 mmol/L (ref 22–32)
Calcium: 9.3 mg/dL (ref 8.9–10.3)
Chloride: 100 mmol/L (ref 98–111)
Creatinine, Ser: 1.64 mg/dL — ABNORMAL HIGH (ref 0.61–1.24)
GFR, Estimated: 41 mL/min — ABNORMAL LOW (ref >60)
Glucose, Bld: 164 mg/dL — ABNORMAL HIGH (ref 70–99)
Potassium: 4.7 mmol/L (ref 3.5–5.1)
Sodium: 136 mmol/L (ref 135–145)
Total Bilirubin: 0.4 mg/dL (ref 0.3–1.2)
Total Protein: 6 g/dL — ABNORMAL LOW (ref 6.5–8.1)

## 2021-01-08 LAB — TRAUMA TEG PANEL
CFF Max Amplitude: 10.7 mm — ABNORMAL LOW (ref 15–32)
Citrated Kaolin (R): 5.5 min (ref 4.6–9.1)
Citrated Rapid TEG (MA): 44.7 mm — ABNORMAL LOW (ref 52–70)
Lysis at 30 Minutes: 0 % (ref 0.0–2.6)

## 2021-01-08 LAB — LACTIC ACID, PLASMA: Lactic Acid, Venous: 1.7 mmol/L (ref 0.5–1.9)

## 2021-01-08 LAB — MASSIVE TRANSFUSION PROTOCOL ORDER (BLOOD BANK NOTIFICATION)

## 2021-01-08 LAB — RESP PANEL BY RT-PCR (FLU A&B, COVID) ARPGX2
Influenza A by PCR: NEGATIVE
Influenza B by PCR: NEGATIVE
SARS Coronavirus 2 by RT PCR: NEGATIVE

## 2021-01-08 LAB — PROTIME-INR
INR: 1.1 (ref 0.8–1.2)
Prothrombin Time: 13.9 seconds (ref 11.4–15.2)

## 2021-01-08 LAB — PREPARE RBC (CROSSMATCH)

## 2021-01-08 LAB — ETHANOL: Alcohol, Ethyl (B): <10 mg/dL (ref <10)

## 2021-01-08 MED ORDER — LACTATED RINGERS IV SOLN: INTRAVENOUS | Status: DC

## 2021-01-08 MED ORDER — MIDAZOLAM HCL 2 MG/2ML IJ SOLN
INTRAMUSCULAR | Status: AC
  Filled 2021-01-08: qty 4

## 2021-01-08 MED ORDER — GELATIN ABSORBABLE 12-7 MM EX MISC
CUTANEOUS | Status: AC
  Filled 2021-01-08: qty 2

## 2021-01-08 MED ORDER — IOHEXOL 300 MG/ML  SOLN
100.0000 mL | Freq: Once | INTRAMUSCULAR | Status: AC | PRN
  Administered 2021-01-08: 33 mL via INTRA_ARTERIAL

## 2021-01-08 MED ORDER — IOHEXOL 350 MG/ML SOLN
100.0000 mL | Freq: Once | INTRAVENOUS | Status: AC | PRN
  Administered 2021-01-08: 100 mL via INTRAVENOUS

## 2021-01-08 MED ORDER — CHLORHEXIDINE GLUCONATE CLOTH 2 % EX PADS
6.0000 | MEDICATED_PAD | Freq: Every day | CUTANEOUS | Status: DC
  Administered 2021-01-08 – 2021-01-24 (×15): 6 via TOPICAL

## 2021-01-08 MED ORDER — FENTANYL CITRATE (PF) 100 MCG/2ML IJ SOLN
INTRAMUSCULAR | Status: AC
  Filled 2021-01-08: qty 4

## 2021-01-08 MED ORDER — METOPROLOL TARTRATE 5 MG/5ML IV SOLN
5.0000 mg | Freq: Four times a day (QID) | INTRAVENOUS | Status: DC | PRN
  Administered 2021-01-17 (×2): 5 mg via INTRAVENOUS
  Filled 2021-01-08: qty 5

## 2021-01-08 MED ORDER — MORPHINE SULFATE (PF) 2 MG/ML IV SOLN: 1.0000 mg | INTRAVENOUS | Status: DC | PRN

## 2021-01-08 MED ORDER — FENTANYL CITRATE (PF) 100 MCG/2ML IJ SOLN
INTRAMUSCULAR | Status: AC | PRN
  Administered 2021-01-08: 25 ug via INTRAVENOUS

## 2021-01-08 MED ORDER — SODIUM CHLORIDE 0.9% IV SOLUTION: Freq: Once | INTRAVENOUS | Status: DC

## 2021-01-08 MED ORDER — LIDOCAINE HCL 1 % IJ SOLN
INTRAMUSCULAR | Status: AC
  Filled 2021-01-08: qty 20

## 2021-01-08 MED ORDER — IOHEXOL 300 MG/ML  SOLN: 50.0000 mL | Freq: Once | INTRAMUSCULAR | Status: DC | PRN

## 2021-01-08 MED ORDER — ACETAMINOPHEN 325 MG PO TABS
650.0000 mg | ORAL_TABLET | ORAL | Status: DC | PRN
  Administered 2021-01-09 – 2021-01-14 (×9): 650 mg via ORAL
  Filled 2021-01-08 (×9): qty 2

## 2021-01-08 MED ORDER — MIDAZOLAM HCL 2 MG/2ML IJ SOLN
INTRAMUSCULAR | Status: AC | PRN
  Administered 2021-01-08: 0.5 mg via INTRAVENOUS

## 2021-01-08 MED ORDER — SODIUM CHLORIDE 0.9 % IV BOLUS: 1000.0000 mL | Freq: Once | INTRAVENOUS | Status: DC

## 2021-01-08 MED ORDER — ONDANSETRON HCL 4 MG/2ML IJ SOLN
4.0000 mg | Freq: Four times a day (QID) | INTRAMUSCULAR | Status: DC | PRN
  Administered 2021-01-09: 4 mg via INTRAVENOUS
  Filled 2021-01-08: qty 2

## 2021-01-08 MED ORDER — IOHEXOL 300 MG/ML  SOLN
150.0000 mL | Freq: Once | INTRAMUSCULAR | Status: AC | PRN
  Administered 2021-01-08: 40 mL via INTRA_ARTERIAL

## 2021-01-08 MED ORDER — ONDANSETRON 4 MG PO TBDP
4.0000 mg | ORAL_TABLET | Freq: Four times a day (QID) | ORAL | Status: DC | PRN
  Filled 2021-01-08: qty 1

## 2021-01-08 MED ORDER — OXYCODONE HCL 5 MG PO TABS
2.5000 mg | ORAL_TABLET | ORAL | Status: DC | PRN
  Administered 2021-01-19 – 2021-01-24 (×6): 5 mg via ORAL
  Filled 2021-01-08 (×6): qty 1

## 2021-01-08 NOTE — Sedation Documentation (Signed)
Report called to [place] 2H RN. Groin level 0. Pt tolerated procedure very well.  Totals: Time 79mins Fentanyl 48mcg Versed 0.5mg 
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
## 2021-01-08 NOTE — Plan of Care (Signed)

## 2021-01-08 NOTE — Progress Notes (Signed)
IR present at bedside. Report given to [place], Therapist, sports. Pt leaving unit.
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
## 2021-01-08 NOTE — Sedation Documentation (Signed)
Patient is resting comfortably. Procedure started °
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
## 2021-01-08 NOTE — Procedures (Signed)
Interventional Radiology Procedure Note  Procedure: Pelvic angiogram with bilateral gel-foam embolization of in the internal iliac arteries  Complications: None  Estimated Blood Loss: None  Recommendations: - Trend H&H, transfuse as needed   Signed,  Criselda Peaches, MD
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
## 2021-01-08 NOTE — Sedation Documentation (Signed)
Provider at bedside, aware of pt vitals. Procedure continues

## 2021-01-08 NOTE — Sedation Documentation (Signed)
Pt O2 sat 86% on 6LNC. Changed pt to NRB

## 2021-01-08 NOTE — Sedation Documentation (Signed)
Vital signs stable. 

## 2021-01-08 NOTE — Sedation Documentation (Signed)
Procedure continues 

## 2021-01-08 NOTE — Sedation Documentation (Signed)
Patient is resting comfortably. 

## 2021-01-08 NOTE — Progress Notes (Signed)
Pt back from IR. [id number]

## 2021-01-09 ENCOUNTER — Encounter (HOSPITAL_COMMUNITY): Admission: EM | Disposition: A | Discharge status: Skilled Nursing Facility | Payer: Self-pay | Source: Home / Self Care

## 2021-01-09 ENCOUNTER — Encounter (HOSPITAL_COMMUNITY): Payer: Self-pay

## 2021-01-09 ENCOUNTER — Inpatient Hospital Stay (HOSPITAL_COMMUNITY): Payer: Medicare Other | Admitting: Certified Registered Nurse Anesthetist

## 2021-01-09 ENCOUNTER — Inpatient Hospital Stay (HOSPITAL_COMMUNITY): Payer: Medicare Other

## 2021-01-09 DIAGNOSIS — D631 Anemia in chronic kidney disease: Secondary | ICD-10-CM | POA: Diagnosis not present

## 2021-01-09 DIAGNOSIS — N189 Chronic kidney disease, unspecified: Secondary | ICD-10-CM | POA: Diagnosis not present

## 2021-01-09 DIAGNOSIS — S129XXA Fracture of neck, unspecified, initial encounter: Secondary | ICD-10-CM | POA: Diagnosis not present

## 2021-01-09 DIAGNOSIS — I5022 Chronic systolic (congestive) heart failure: Secondary | ICD-10-CM | POA: Diagnosis not present

## 2021-01-09 DIAGNOSIS — I2510 Atherosclerotic heart disease of native coronary artery without angina pectoris: Secondary | ICD-10-CM | POA: Diagnosis not present

## 2021-01-09 DIAGNOSIS — I130 Hypertensive heart and chronic kidney disease with heart failure and stage 1 through stage 4 chronic kidney disease, or unspecified chronic kidney disease: Secondary | ICD-10-CM | POA: Diagnosis not present

## 2021-01-09 HISTORY — PX: ANTERIOR CERVICAL DECOMP/DISCECTOMY FUSION: SHX1161

## 2021-01-09 LAB — BPAM FFP
Blood Product Expiration Date: 202202182359
Blood Product Expiration Date: 202202182359
Blood Product Expiration Date: 202202182359
Blood Product Expiration Date: 202202182359
Blood Product Expiration Date: 202202182359
Blood Product Expiration Date: 202202182359
Blood Product Expiration Date: 202202182359
Blood Product Expiration Date: 202202182359
ISSUE DATE / TIME: 202202161733
ISSUE DATE / TIME: 202202161733
ISSUE DATE / TIME: 202202161733
ISSUE DATE / TIME: 202202161733
ISSUE DATE / TIME: 202202161800
ISSUE DATE / TIME: 202202161800
ISSUE DATE / TIME: 202202161800
ISSUE DATE / TIME: 202202161800
Unit Type and Rh: 5100
Unit Type and Rh: 5100
Unit Type and Rh: 5100
Unit Type and Rh: 6200
Unit Type and Rh: 6200
Unit Type and Rh: 6200
Unit Type and Rh: 6200
Unit Type and Rh: 9500

## 2021-01-09 LAB — BPAM RBC
Blood Product Expiration Date: 202203122359
Blood Product Expiration Date: 202203172359
Blood Product Expiration Date: 202203182359
Blood Product Expiration Date: 202203202359
Blood Product Expiration Date: 202203202359
Blood Product Expiration Date: 202203202359
Blood Product Expiration Date: 202203202359
ISSUE DATE / TIME: 202202161618
ISSUE DATE / TIME: 202202161618
ISSUE DATE / TIME: 202202161722
ISSUE DATE / TIME: 202202161730
ISSUE DATE / TIME: 202202161730
ISSUE DATE / TIME: 202202161733
ISSUE DATE / TIME: 202202161733
Unit Type and Rh: 5100
Unit Type and Rh: 5100
Unit Type and Rh: 5100
Unit Type and Rh: 5100
Unit Type and Rh: 5100
Unit Type and Rh: 5100
Unit Type and Rh: 5100

## 2021-01-09 LAB — BASIC METABOLIC PANEL
Anion gap: 10 (ref 5–15)
BUN: 27 mg/dL — ABNORMAL HIGH (ref 8–23)
CO2: 21 mmol/L — ABNORMAL LOW (ref 22–32)
Calcium: 8.2 mg/dL — ABNORMAL LOW (ref 8.9–10.3)
Chloride: 108 mmol/L (ref 98–111)
Creatinine, Ser: 1.41 mg/dL — ABNORMAL HIGH (ref 0.61–1.24)
GFR, Estimated: 49 mL/min — ABNORMAL LOW (ref >60)
Glucose, Bld: 136 mg/dL — ABNORMAL HIGH (ref 70–99)
Potassium: 4.1 mmol/L (ref 3.5–5.1)
Sodium: 139 mmol/L (ref 135–145)

## 2021-01-09 LAB — CBC
HCT: 36.5 % — ABNORMAL LOW (ref 39.0–52.0)
HCT: 36.7 % — ABNORMAL LOW (ref 39.0–52.0)
Hemoglobin: 12.8 g/dL — ABNORMAL LOW (ref 13.0–17.0)
Hemoglobin: 12 g/dL — ABNORMAL LOW (ref 13.0–17.0)
MCH: 31.2 pg (ref 26.0–34.0)
MCH: 29.8 pg (ref 26.0–34.0)
MCHC: 35.1 g/dL (ref 30.0–36.0)
MCHC: 32.7 g/dL (ref 30.0–36.0)
MCV: 89 fL (ref 80.0–100.0)
MCV: 91.1 fL (ref 80.0–100.0)
Platelets: 96 10*3/uL — ABNORMAL LOW (ref 150–400)
Platelets: 104 10*3/uL — ABNORMAL LOW (ref 150–400)
RBC: 4.1 MIL/uL — ABNORMAL LOW (ref 4.22–5.81)
RBC: 4.03 MIL/uL — ABNORMAL LOW (ref 4.22–5.81)
RDW: 15.7 % — ABNORMAL HIGH (ref 11.5–15.5)
RDW: 15.9 % — ABNORMAL HIGH (ref 11.5–15.5)
WBC: 15.1 10*3/uL — ABNORMAL HIGH (ref 4.0–10.5)
WBC: 16 10*3/uL — ABNORMAL HIGH (ref 4.0–10.5)
nRBC: 0 % (ref 0.0–0.2)
nRBC: 0 % (ref 0.0–0.2)

## 2021-01-09 LAB — PREPARE FRESH FROZEN PLASMA
Unit division: 0
Unit division: 0
Unit division: 0

## 2021-01-09 LAB — TYPE AND SCREEN
ABO/RH(D): O POS
Antibody Screen: NEGATIVE
Unit division: 0
Unit division: 0
Unit division: 0
Unit division: 0
Unit division: 0
Unit division: 0
Unit division: 0

## 2021-01-09 LAB — MRSA PCR SCREENING: MRSA by PCR: NEGATIVE

## 2021-01-09 LAB — PROTIME-INR
INR: 1.2 (ref 0.8–1.2)
Prothrombin Time: 15.2 seconds (ref 11.4–15.2)

## 2021-01-09 LAB — APTT: aPTT: 35 seconds (ref 24–36)

## 2021-01-09 LAB — BLOOD PRODUCT ORDER (VERBAL) VERIFICATION

## 2021-01-09 SURGERY — ANTERIOR CERVICAL DECOMPRESSION/DISCECTOMY FUSION 1 LEVEL
Anesthesia: General | Site: Neck

## 2021-01-09 MED ORDER — DEXAMETHASONE SODIUM PHOSPHATE 10 MG/ML IJ SOLN
INTRAMUSCULAR | Status: DC | PRN
  Administered 2021-01-09: 10 mg via INTRAVENOUS

## 2021-01-09 MED ORDER — OXYCODONE HCL 5 MG/5ML PO SOLN: 5.0000 mg | Freq: Once | ORAL | Status: DC | PRN

## 2021-01-09 MED ORDER — CHLORHEXIDINE GLUCONATE 0.12 % MT SOLN: 15.0000 mL | Freq: Once | OROMUCOSAL | Status: DC

## 2021-01-09 MED ORDER — SUGAMMADEX SODIUM 200 MG/2ML IV SOLN
INTRAVENOUS | Status: DC | PRN
  Administered 2021-01-09 (×2): 50 mg via INTRAVENOUS
  Administered 2021-01-09: 100 mg via INTRAVENOUS

## 2021-01-09 MED ORDER — PHENYLEPHRINE 40 MCG/ML (10ML) SYRINGE FOR IV PUSH (FOR BLOOD PRESSURE SUPPORT)
PREFILLED_SYRINGE | INTRAVENOUS | Status: DC | PRN
  Administered 2021-01-09: 80 ug via INTRAVENOUS

## 2021-01-09 MED ORDER — CEFAZOLIN SODIUM-DEXTROSE 2-4 GM/100ML-% IV SOLN
INTRAVENOUS | Status: AC
  Filled 2021-01-09: qty 100

## 2021-01-09 MED ORDER — ONDANSETRON HCL 4 MG/2ML IJ SOLN: 4.0000 mg | Freq: Once | INTRAMUSCULAR | Status: DC | PRN

## 2021-01-09 MED ORDER — ORAL CARE MOUTH RINSE
15.0000 mL | Freq: Two times a day (BID) | OROMUCOSAL | Status: DC
  Administered 2021-01-09 – 2021-01-24 (×27): 15 mL via OROMUCOSAL

## 2021-01-09 MED ORDER — PROPOFOL 10 MG/ML IV BOLUS
INTRAVENOUS | Status: AC
  Filled 2021-01-09: qty 20

## 2021-01-09 MED ORDER — VASOPRESSIN 20 UNIT/ML IV SOLN
INTRAVENOUS | Status: AC
  Filled 2021-01-09: qty 1

## 2021-01-09 MED ORDER — LACTATED RINGERS IV SOLN: INTRAVENOUS | Status: DC | PRN

## 2021-01-09 MED ORDER — FENTANYL CITRATE (PF) 250 MCG/5ML IJ SOLN
INTRAMUSCULAR | Status: DC | PRN
Start: 2021-01-09 — End: 2021-01-09
  Administered 2021-01-09 (×3): 25 ug via INTRAVENOUS
  Administered 2021-01-09: 50 ug via INTRAVENOUS
  Administered 2021-01-09: 25 ug via INTRAVENOUS

## 2021-01-09 MED ORDER — MIDAZOLAM HCL 2 MG/2ML IJ SOLN
INTRAMUSCULAR | Status: DC | PRN
  Administered 2021-01-09: 1 mg via INTRAVENOUS

## 2021-01-09 MED ORDER — ONDANSETRON HCL 4 MG/2ML IJ SOLN
INTRAMUSCULAR | Status: DC | PRN
  Administered 2021-01-09: 4 mg via INTRAVENOUS

## 2021-01-09 MED ORDER — LIDOCAINE HCL URETHRAL/MUCOSAL 2 % EX GEL
CUTANEOUS | Status: DC | PRN
  Administered 2021-01-09: 1 via TOPICAL

## 2021-01-09 MED ORDER — PHENYLEPHRINE HCL-NACL 10-0.9 MG/250ML-% IV SOLN
INTRAVENOUS | Status: DC | PRN
  Administered 2021-01-09: 20 ug/min via INTRAVENOUS

## 2021-01-09 MED ORDER — LIDOCAINE-EPINEPHRINE 1 %-1:100000 IJ SOLN
INTRAMUSCULAR | Status: AC
  Filled 2021-01-09: qty 1

## 2021-01-09 MED ORDER — LIDOCAINE HCL 4 % EX SOLN
CUTANEOUS | Status: DC | PRN
  Administered 2021-01-09: 2.5 mL via TOPICAL

## 2021-01-09 MED ORDER — LACTATED RINGERS IV SOLN: INTRAVENOUS | Status: DC

## 2021-01-09 MED ORDER — CEFAZOLIN SODIUM-DEXTROSE 2-3 GM-%(50ML) IV SOLR
INTRAVENOUS | Status: DC | PRN
  Administered 2021-01-09: 2 g via INTRAVENOUS

## 2021-01-09 MED ORDER — SUCCINYLCHOLINE CHLORIDE 200 MG/10ML IV SOSY
PREFILLED_SYRINGE | INTRAVENOUS | Status: DC | PRN
  Administered 2021-01-09: 80 mg via INTRAVENOUS

## 2021-01-09 MED ORDER — VASOPRESSIN 20 UNIT/ML IV SOLN
INTRAVENOUS | Status: DC | PRN
  Administered 2021-01-09 (×3): 1 [IU] via INTRAVENOUS

## 2021-01-09 MED ORDER — GLYCOPYRROLATE PF 0.2 MG/ML IJ SOSY
PREFILLED_SYRINGE | INTRAMUSCULAR | Status: AC
  Filled 2021-01-09: qty 1

## 2021-01-09 MED ORDER — LEVOTHYROXINE SODIUM 50 MCG PO TABS
50.0000 ug | ORAL_TABLET | Freq: Every day | ORAL | Status: DC
  Administered 2021-01-10 – 2021-01-24 (×15): 50 ug via ORAL
  Filled 2021-01-09 (×15): qty 1

## 2021-01-09 MED ORDER — GLYCOPYRROLATE 0.2 MG/ML IJ SOLN
INTRAMUSCULAR | Status: DC | PRN
  Administered 2021-01-09: .2 mg via INTRAVENOUS

## 2021-01-09 MED ORDER — DEXMEDETOMIDINE HCL IN NACL 400 MCG/100ML IV SOLN
INTRAVENOUS | Status: DC | PRN
  Administered 2021-01-09: 6 ug/kg/h via INTRAVENOUS

## 2021-01-09 MED ORDER — ROCURONIUM BROMIDE 10 MG/ML (PF) SYRINGE
PREFILLED_SYRINGE | INTRAVENOUS | Status: DC | PRN
  Administered 2021-01-09: 40 mg via INTRAVENOUS

## 2021-01-09 MED ORDER — NOREPINEPHRINE 4 MG/250ML-% IV SOLN
0.0000 ug/min | INTRAVENOUS | Status: AC
  Administered 2021-01-09: 2 ug/min via INTRAVENOUS
  Filled 2021-01-09: qty 250

## 2021-01-09 MED ORDER — CHLORHEXIDINE GLUCONATE 0.12 % MT SOLN
OROMUCOSAL | Status: AC
  Filled 2021-01-09: qty 15

## 2021-01-09 MED ORDER — THROMBIN 5000 UNITS EX SOLR
OROMUCOSAL | Status: DC | PRN
  Administered 2021-01-09: 5 mL via TOPICAL

## 2021-01-09 MED ORDER — LIDOCAINE-EPINEPHRINE 1 %-1:100000 IJ SOLN
INTRAMUSCULAR | Status: DC | PRN
  Administered 2021-01-09: 7 mL via INTRADERMAL

## 2021-01-09 MED ORDER — MIDAZOLAM HCL 2 MG/2ML IJ SOLN
INTRAMUSCULAR | Status: AC
  Filled 2021-01-09: qty 2

## 2021-01-09 MED ORDER — 0.9 % SODIUM CHLORIDE (POUR BTL) OPTIME
TOPICAL | Status: DC | PRN
  Administered 2021-01-09: 1000 mL

## 2021-01-09 MED ORDER — OXYCODONE HCL 5 MG PO TABS
5.0000 mg | ORAL_TABLET | Freq: Once | ORAL | Status: DC | PRN
Start: 2021-01-09 — End: 2021-01-09

## 2021-01-09 MED ORDER — FENTANYL CITRATE (PF) 100 MCG/2ML IJ SOLN: 25.0000 ug | INTRAMUSCULAR | Status: DC | PRN

## 2021-01-09 MED ORDER — THROMBIN 5000 UNITS EX SOLR
CUTANEOUS | Status: AC
  Filled 2021-01-09: qty 5000

## 2021-01-09 MED ORDER — FENTANYL CITRATE (PF) 250 MCG/5ML IJ SOLN
INTRAMUSCULAR | Status: AC
  Filled 2021-01-09: qty 5

## 2021-01-09 SURGICAL SUPPLY — 59 items
ADH SKN CLS APL DERMABOND .7 (GAUZE/BANDAGES/DRESSINGS) ×1
APL SKNCLS STERI-STRIP NONHPOA (GAUZE/BANDAGES/DRESSINGS)
BAND INSRT 18 STRL LF DISP RB (MISCELLANEOUS) ×2
BAND RUBBER #18 3X1/16 STRL (MISCELLANEOUS) ×4 IMPLANT
BENZOIN TINCTURE PRP APPL 2/3 (GAUZE/BANDAGES/DRESSINGS) IMPLANT
BLADE CLIPPER SURG (BLADE) IMPLANT
BLADE SURG 11 STRL SS (BLADE) ×2 IMPLANT
BUR MATCHSTICK NEURO 3.0 LAGG (BURR) ×2 IMPLANT
CANISTER SUCT 3000ML PPV (MISCELLANEOUS) ×2 IMPLANT
COVER WAND RF STERILE (DRAPES) ×2 IMPLANT
DECANTER SPIKE VIAL GLASS SM (MISCELLANEOUS) ×2 IMPLANT
DERMABOND ADVANCED (GAUZE/BANDAGES/DRESSINGS) ×1
DERMABOND ADVANCED .7 DNX12 (GAUZE/BANDAGES/DRESSINGS) ×1 IMPLANT
DRAPE C-ARM 42X72 X-RAY (DRAPES) ×4 IMPLANT
DRAPE HALF SHEET 40X57 (DRAPES) IMPLANT
DRAPE LAPAROTOMY 100X72 PEDS (DRAPES) ×2 IMPLANT
DRAPE MICROSCOPE LEICA (MISCELLANEOUS) ×2 IMPLANT
DURAPREP 6ML APPLICATOR 50/CS (WOUND CARE) ×2 IMPLANT
ELECT COATED BLADE 2.86 ST (ELECTRODE) ×2 IMPLANT
ELECT REM PT RETURN 9FT ADLT (ELECTROSURGICAL) ×2
ELECTRODE REM PT RTRN 9FT ADLT (ELECTROSURGICAL) ×1 IMPLANT
GAUZE 4X4 16PLY RFD (DISPOSABLE) IMPLANT
GLOVE BIOGEL M 6.5 STRL (GLOVE) ×1 IMPLANT
GLOVE BIOGEL M 7.0 STRL (GLOVE) ×1 IMPLANT
GLOVE BIOGEL PI IND STRL 7.5 (GLOVE) ×2 IMPLANT
GLOVE BIOGEL PI INDICATOR 7.5 (GLOVE) ×2
GLOVE ECLIPSE 7.5 STRL STRAW (GLOVE) ×2 IMPLANT
GLOVE EXAM NITRILE LRG STRL (GLOVE) IMPLANT
GLOVE EXAM NITRILE XL STR (GLOVE) IMPLANT
GLOVE EXAM NITRILE XS STR PU (GLOVE) IMPLANT
GOWN STRL REUS W/ TWL LRG LVL3 (GOWN DISPOSABLE) ×2 IMPLANT
GOWN STRL REUS W/ TWL XL LVL3 (GOWN DISPOSABLE) IMPLANT
GOWN STRL REUS W/TWL 2XL LVL3 (GOWN DISPOSABLE) IMPLANT
GOWN STRL REUS W/TWL LRG LVL3 (GOWN DISPOSABLE) ×4
GOWN STRL REUS W/TWL XL LVL3 (GOWN DISPOSABLE)
HEMOSTAT POWDER KIT SURGIFOAM (HEMOSTASIS) ×2 IMPLANT
KIT BASIN OR (CUSTOM PROCEDURE TRAY) ×2 IMPLANT
KIT TURNOVER KIT B (KITS) ×2 IMPLANT
NDL SPNL 18GX3.5 QUINCKE PK (NEEDLE) ×1 IMPLANT
NEEDLE HYPO 22GX1.5 SAFETY (NEEDLE) ×2 IMPLANT
NEEDLE SPNL 18GX3.5 QUINCKE PK (NEEDLE) ×2 IMPLANT
NS IRRIG 1000ML POUR BTL (IV SOLUTION) ×2 IMPLANT
PACK LAMINECTOMY NEURO (CUSTOM PROCEDURE TRAY) ×2 IMPLANT
PAD ARMBOARD 7.5X6 YLW CONV (MISCELLANEOUS) ×6 IMPLANT
PIN DISTRACTION 14MM (PIN) IMPLANT
PLATE ANT ATLANTIS ELITE 35 (Plate) ×1 IMPLANT
SCREW SELF TAP VAR 4.0X13 (Screw) ×6 IMPLANT
SPACER BONE CORNERSTONE 5X14 (Orthopedic Implant) ×1 IMPLANT
SPONGE INTESTINAL PEANUT (DISPOSABLE) ×3 IMPLANT
SPONGE SURGIFOAM ABS GEL SZ50 (HEMOSTASIS) IMPLANT
STAPLER VISISTAT 35W (STAPLE) IMPLANT
SUT MNCRL AB 3-0 PS2 18 (SUTURE) ×2 IMPLANT
SUT MON AB 3-0 SH 27 (SUTURE) ×2
SUT MON AB 3-0 SH27 (SUTURE) ×1 IMPLANT
SUT VIC AB 3-0 SH 8-18 (SUTURE) ×2 IMPLANT
TAPE CLOTH 3X10 TAN LF (GAUZE/BANDAGES/DRESSINGS) ×2 IMPLANT
TOWEL GREEN STERILE (TOWEL DISPOSABLE) ×2 IMPLANT
TOWEL GREEN STERILE FF (TOWEL DISPOSABLE) ×2 IMPLANT
WATER STERILE IRR 1000ML POUR (IV SOLUTION) ×2 IMPLANT

## 2021-01-09 NOTE — Anesthesia Preprocedure Evaluation (Addendum)
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 on home beta blockers and Pt. on medications + CAD, + Cardiac Stents and +CHF  Normal cardiovascular exam+ dysrhythmias Atrial Fibrillation + Cardiac Defibrillator    '12 TTE - EF 30% to 35%. Akinesis of the mid inferoseptal and anteroseptal walls, the apical septal / lateral / anterior / inferior walls, the true apex. Grade 1 diastolic dysfunction. LA was mildly dilated. Atrial septal aneurysm.     Neuro/Psych PSYCHIATRIC DISORDERS Anxiety Depression  C-spine not cleared    GI/Hepatic Neg liver ROS, hiatal hernia, GERD  Controlled,  Endo/Other  Hypothyroidism   Renal/GU CRFRenal disease     Musculoskeletal  (+) Arthritis ,  Ankylosing spondylitis with chin-on-chest deformity     Abdominal   Peds  Hematology  (+) anemia ,  Thrombocytopenia, Plt 104k    Anesthesia Other Findings Covid test negative   Reproductive/Obstetrics                           Anesthesia Physical Anesthesia Plan  ASA: III  Anesthesia Plan: General   Post-op Pain Management:    Induction: Intravenous  PONV Risk Score and Plan: 2 and Treatment may vary due to age or medical condition and Ondansetron  Airway Management Planned: Awake Intubation Planned and Fiberoptic Intubation Planned  Additional Equipment:  None  Intra-op Plan:   Post-operative Plan: Possible Post-op intubation/ventilation  Informed Consent: I have reviewed the patients History and Physical, chart, labs and discussed the procedure including the risks, benefits and alternatives for the proposed anesthesia with the patient or authorized representative who has indicated his/her understanding and acceptance.     Dental advisory given  Plan Discussed with: CRNA, Anesthesiologist and Surgeon  Anesthesia Plan Comments:        Anesthesia Quick Evaluation

## 2021-01-09 NOTE — Anesthesia Procedure Notes (Signed)
Procedure Name: Awake intubation Date/Time: 01/09/2021 8:47 PM Performed by: Audry Pili, MD Pre-anesthesia Checklist: Patient identified, Emergency Drugs available, Suction available and Patient being monitored Patient Re-evaluated:Patient Re-evaluated prior to induction Oxygen Delivery Method: Nasal cannula Preoxygenation: preoxygenated with nasal cannula only. Induction Type: IV induction Tube type: Oral Tube size: 7.0 mm Number of attempts: 2 Airway Equipment and Method: Fiberoptic brochoscope and LTA kit utilized Placement Confirmation: ETT inserted through vocal cords under direct vision,  positive ETCO2 and breath sounds checked- equal and bilateral Secured at: 24 cm Tube secured with: Tape Dental Injury: Teeth and Oropharynx as per pre-operative assessment  Difficulty Due To: Difficulty was anticipated, Difficult Airway- due to reduced neck mobility and Difficult Airway-  due to neck instability Future Recommendations: Recommend- awake intubation Comments: Induced sedation with precedex infusion, glycopyrrolate, and midazolam as documented. Once patient stated he was drowsy, 2% lidocaine sprayed with atomized into oropharynx. Patient swished lidocaine before swallowing it. This was performed several times during the precedex infusion, using a total of 5cc of 2% lidocaine. Also utilized lidocaine lollipop with 2% lidocaine jelly to anesthetize posterior oropharynx, as well as check for adequate anesthesia. Once patient was comfortably sedated, still ventilating spontaneously, and oropharynx anesthetized, ETT passed over fiberoptic bronchoscope successfully into trachea. Bolus of propofol and succinylcholine given once ETT in place to further sedate and relax patient to avoid him coughing on ETT.
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
## 2021-01-09 NOTE — Progress Notes (Signed)
C5-6/C6-7 completed, had to include additional level due to additional fractures seen at C6 intra-op. Extubated, doing well, FCx4 with good strength.  -activity as tolerated -advance diet as tolerated -okay for DVT chemoprophylaxis on 2/19

## 2021-01-09 NOTE — Transfer of Care (Signed)
Immediate Anesthesia Transfer of Care Note  Patient: Corey Jordan  Procedure(s) Performed: ANTERIOR CERVICAL DISCECTOMY FUSION CERVICAL SIX-SEVEN ([name] Neck)  Patient Location: PACU  Anesthesia Type:General  Level of Consciousness: awake, alert  and oriented  Airway & Oxygen Therapy: Patient Spontanous Breathing and Patient connected to face mask oxygen  Post-op Assessment: Report given to RN, Post -op Vital signs reviewed and stable and Patient moving all extremities  Post vital signs: Reviewed and stable  Last Vitals:  Vitals Value Taken Time  BP 96/53 01/09/21 2316  Temp    Pulse 87 01/09/21 2317  Resp 22 01/09/21 2317  SpO2 92 % 01/09/21 2317  Vitals shown include unvalidated device data.  Last Pain:  Vitals:   01/09/21 1509  TempSrc: Oral  PainSc:          Complications: No complications documented.

## 2021-01-09 NOTE — Progress Notes (Signed)
Ortho Trauma Service   Just made aware of pt and need for OTS Consult Full consult to follow   Imaging reviewed  S/p R and L internal iliac gel foam embolization with IR   R LC1 pelvic ring fracture  Will allow WBAT R LEx with assistance and see how he progresses with therapies  Check baseline AP, inlet and outlet pelvic xrays  No restrictions from [place], PA-C [phone] (C) 01/09/2021, 11:25 AM  [facility] [place] [phone] Domingo Sep (F)
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
## 2021-01-09 NOTE — Progress Notes (Signed)
Spoke with patient's daughter-in-law. She states that when it comes time for patient to go to a facility, both sons need to be notified of the process. Will discuss with case manager/social worker.

## 2021-01-09 NOTE — Progress Notes (Signed)
Notified [name] and Alexis Goodell per patients request that he has gone to the OR.  OR nurse/MD to notify family post surgery.

## 2021-01-10 ENCOUNTER — Inpatient Hospital Stay (HOSPITAL_COMMUNITY): Payer: Medicare Other

## 2021-01-10 ENCOUNTER — Encounter (HOSPITAL_COMMUNITY): Payer: Self-pay | Admitting: Neurological Surgery

## 2021-01-10 DIAGNOSIS — Z66 Do not resuscitate: Secondary | ICD-10-CM | POA: Diagnosis not present

## 2021-01-10 DIAGNOSIS — Z515 Encounter for palliative care: Secondary | ICD-10-CM

## 2021-01-10 DIAGNOSIS — Z7189 Other specified counseling: Secondary | ICD-10-CM

## 2021-01-10 LAB — CBC
HCT: 25.4 % — ABNORMAL LOW (ref 39.0–52.0)
HCT: 27 % — ABNORMAL LOW (ref 39.0–52.0)
Hemoglobin: 8.4 g/dL — ABNORMAL LOW (ref 13.0–17.0)
Hemoglobin: 8.7 g/dL — ABNORMAL LOW (ref 13.0–17.0)
MCH: 30.5 pg (ref 26.0–34.0)
MCH: 30.1 pg (ref 26.0–34.0)
MCHC: 33.1 g/dL (ref 30.0–36.0)
MCHC: 32.2 g/dL (ref 30.0–36.0)
MCV: 92.4 fL (ref 80.0–100.0)
MCV: 93.4 fL (ref 80.0–100.0)
Platelets: 74 10*3/uL — ABNORMAL LOW (ref 150–400)
Platelets: 96 10*3/uL — ABNORMAL LOW (ref 150–400)
RBC: 2.75 MIL/uL — ABNORMAL LOW (ref 4.22–5.81)
RBC: 2.89 MIL/uL — ABNORMAL LOW (ref 4.22–5.81)
RDW: 15.9 % — ABNORMAL HIGH (ref 11.5–15.5)
RDW: 15.9 % — ABNORMAL HIGH (ref 11.5–15.5)
WBC: 9.5 10*3/uL (ref 4.0–10.5)
WBC: 14.8 10*3/uL — ABNORMAL HIGH (ref 4.0–10.5)
nRBC: 0 % (ref 0.0–0.2)
nRBC: 0 % (ref 0.0–0.2)

## 2021-01-10 LAB — BASIC METABOLIC PANEL WITH GFR
Anion gap: 8 (ref 5–15)
BUN: 49 mg/dL — ABNORMAL HIGH (ref 8–23)
CO2: 21 mmol/L — ABNORMAL LOW (ref 22–32)
Calcium: 8.1 mg/dL — ABNORMAL LOW (ref 8.9–10.3)
Chloride: 109 mmol/L (ref 98–111)
Creatinine, Ser: 1.87 mg/dL — ABNORMAL HIGH (ref 0.61–1.24)
GFR, Estimated: 35 mL/min — ABNORMAL LOW (ref >60)
Glucose, Bld: 147 mg/dL — ABNORMAL HIGH (ref 70–99)
Potassium: 4.4 mmol/L (ref 3.5–5.1)
Sodium: 138 mmol/L (ref 135–145)

## 2021-01-10 MED ORDER — DOCUSATE SODIUM 100 MG PO CAPS
100.0000 mg | ORAL_CAPSULE | Freq: Two times a day (BID) | ORAL | Status: DC
  Administered 2021-01-10 – 2021-01-24 (×29): 100 mg via ORAL
  Filled 2021-01-10 (×29): qty 1

## 2021-01-10 MED ORDER — POLYETHYLENE GLYCOL 3350 17 G PO PACK
17.0000 g | PACK | Freq: Every day | ORAL | Status: DC
  Administered 2021-01-10 – 2021-01-13 (×3): 17 g via ORAL
  Filled 2021-01-10 (×3): qty 1

## 2021-01-10 NOTE — Progress Notes (Signed)
Neurosurgery Service Progress Note  Subjective: No acute events overnight, mild neck pain, improved from preop, currently eating dinner with some odynophagia but no dysphagia, no new radicular symptoms   Objective: Vitals:   01/10/21 1300 01/10/21 1400 01/10/21 1500 01/10/21 1600  BP: (!) 110/44 (!) 127/49 (!) 118/55 120/66  Pulse: 76 85 80 72  Resp: 20 13 20 20   Temp:   97.8 F (36.6 C)   TempSrc:      SpO2: 93% 94% (!) 87% 97%  Weight:      Height:       Temp (24hrs), Avg:97.9 F (36.6 C), Min:97.4 F (36.3 C), Max:98.5 F (36.9 C)  CBC Latest Ref Rng & Units 01/10/2021 01/10/2021 01/09/2021  WBC 4.0 - 10.5 K/uL 14.8(H) 9.5 16.0(H)  Hemoglobin 13.0 - 17.0 g/dL 8.7(L) 8.4(L) 12.0(L)  Hematocrit 39.0 - 52.0 % 27.0(L) 25.4(L) 36.7(L)  Platelets 150 - 400 K/uL 96(L) 74(L) 104(L)   BMP Latest Ref Rng & Units 01/10/2021 01/09/2021 01/08/2021  Glucose 70 - 99 mg/dL 147(H) 136(H) 160(H)  BUN 8 - 23 mg/dL 49(H) 27(H) 29(H)  Creatinine 0.61 - 1.24 mg/dL 1.87(H) 1.41(H) 1.70(H)  BUN/Creat Ratio 10 - 24 - - -  Sodium 135 - 145 mmol/L 138 139 136  Potassium 3.5 - 5.1 mmol/L 4.4 4.1 4.6  Chloride 98 - 111 mmol/L 109 108 100  CO2 22 - 32 mmol/L 21(L) 21(L) -  Calcium 8.9 - 10.3 mg/dL 8.1(L) 8.2(L) -    Intake/Output Summary (Last 24 hours) at 01/10/2021 1658 Last data filed at 01/10/2021 1600 Gross per 24 hour  Intake 2392.82 ml  Output 835 ml  Net 1557.82 ml    Current Facility-Administered Medications:  .  0.9 %  sodium chloride infusion (Manually program via Guardrails IV Fluids), , Intravenous, Once, Maczis, Barth Kirks, PA-C .  acetaminophen (TYLENOL) tablet 650 mg, 650 mg, Oral, Q4H PRN, Emre, Stock, PA-C, 650 mg at 01/09/21 4401 .  Chlorhexidine Gluconate Cloth 2 % PADS 6 each, 6 each, Topical, Daily, Jesusita Oka, MD, 6 each at 01/10/21 0900 .  docusate sodium (COLACE) capsule 100 mg, 100 mg, Oral, BID, Jillyn Ledger, PA-C, 100 mg at 01/10/21 1156 .  iohexol  (OMNIPAQUE) 300 MG/ML solution 50 mL, 50 mL, Intra-arterial, Once PRN, Criselda Peaches, MD .  lactated ringers infusion, , Intravenous, Continuous, Maczis, Barth Kirks, PA-C, Last Rate: 50 mL/hr at 01/10/21 1130, IV Pump Association at 01/10/21 1130 .  levothyroxine (SYNTHROID) tablet 50 mcg, 50 mcg, Oral, QAC breakfast, Jillyn Ledger, PA-C, 50 mcg at 01/10/21 0272 .  MEDLINE mouth rinse, 15 mL, Mouth Rinse, BID, Georganna Skeans, MD, 15 mL at 01/10/21 1000 .  metoprolol tartrate (LOPRESSOR) injection 5 mg, 5 mg, Intravenous, Q6H PRN, [place], Hagger, PA-C .  morphine 2 MG/ML injection 1-2 mg, 1-2 mg, Intravenous, Q2H PRN, Fredrick, [place], PA-C .  ondansetron (ZOFRAN-ODT) disintegrating tablet 4 mg, 4 mg, Oral, Q6H PRN **OR** ondansetron (ZOFRAN) injection 4 mg, 4 mg, Intravenous, Q6H PRN, Algis, Le[place]bauer, PA-C, 4 mg at 01/09/21 0517 .  oxyCODONE (Oxy IR/ROXICODONE) immediate release tablet 2.5-5 mg, 2.5-5 mg, Oral, Q4H PRN, Emit, Kuenzel, PA-C .  polyethylene glycol (MIRALAX / GLYCOLAX) packet 17 g, 17 g, Oral, Daily, Jillyn Ledger, PA-C, 17 g at 01/10/21 1156 .  sodium chloride 0.9 % bolus 1,000 mL, 1,000 mL, Intravenous, Once, Trifan, Carola Rhine, MD   Physical Exam: Awake/alert, stable chin-on-chest deformity, FCx4 w/ strength 5/5 and  SILT Incision c/d/i, neck soft  Assessment & Plan: 87 y.o. man w/ DISH s/p fracture through disc-osteophyte complex s/p 2 level ACDF, recovering well.  -okay for DVT chemoprophylaxis tomorrow -advance diet and activity as tolerated -no c-collar needed -will see the patient on AM rounds 2/21, I signed the patients out to my partners that are covering over the weekend and they are available should an issue arise. Please let us know if there are any new concerns or questions  Judith Part  01/10/21 4:58 PM
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
## 2021-01-10 NOTE — Progress Notes (Signed)
Two assist to stand patient at bedside. Couldn't remain standing because his legs buckles. Assisted back to bed.Needs PT
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
## 2021-01-10 NOTE — Anesthesia Postprocedure Evaluation (Signed)
Anesthesia Post Note  Patient: Corey Jordan  Procedure(s) Performed: ANTERIOR CERVICAL DISCECTOMY FUSION CERVICAL SIX-SEVEN ([name] Neck)     Patient location during evaluation: PACU Anesthesia Type: General Level of consciousness: awake Pain management: pain level controlled Vital Signs Assessment: post-procedure vital signs reviewed and stable Respiratory status: spontaneous breathing, nonlabored ventilation, respiratory function stable and patient connected to nasal cannula oxygen Cardiovascular status: blood pressure returned to baseline and stable Postop Assessment: no apparent nausea or vomiting Anesthetic complications: no   No complications documented.  Last Vitals:  Vitals:   01/09/21 2330 01/09/21 2357  BP: 92/66   Pulse: 90 84  Resp: (!) 27 (!) 26  Temp: 36.7 C 36.9 C  SpO2: 93% 92%    Last Pain:  Vitals:   01/09/21 2357  TempSrc: Oral  PainSc: 0-No pain                 Audry Pili

## 2021-01-10 NOTE — Plan of Care (Signed)
  Problem: Education: Goal: Ability to verbalize activity precautions or restrictions will improve Outcome: Progressing Goal: Knowledge of the prescribed therapeutic regimen will improve Outcome: Progressing   Problem: Activity: Goal: Ability to avoid complications of mobility impairment will improve Outcome: Progressing Goal: Will remain free from falls Outcome: Progressing   Problem: Bowel/Gastric: Goal: Gastrointestinal status for postoperative course will improve Outcome: Progressing   Problem: Pain Management: Goal: Pain level will decrease Outcome: Progressing

## 2021-01-10 NOTE — Progress Notes (Signed)
This chaplain responded to PMT consult for notarizing the Pt. Advance Directive.  The Pt. Is awake, alert, and able to communicate his understanding of the role of HCPOA and desire to notarize HCPOA.  The chaplain is present with the Pt., notary, and two witnesses.  The Pt. HCPOA document was notarized naming Billey Chang as his healthcare agent and Alexis Goodell as the person if the healthcare agent is unwilling or unable to serve as healthcare agent.    This chaplain gave the Pt. The original AD and two copies. A copy was scanned to the Pt. EMR.  This chaplain is available for F/U spiritual care as needed.

## 2021-01-11 LAB — BASIC METABOLIC PANEL
Anion gap: 9 (ref 5–15)
BUN: 42 mg/dL — ABNORMAL HIGH (ref 8–23)
CO2: 24 mmol/L (ref 22–32)
Calcium: 8.6 mg/dL — ABNORMAL LOW (ref 8.9–10.3)
Chloride: 107 mmol/L (ref 98–111)
Creatinine, Ser: 1.49 mg/dL — ABNORMAL HIGH (ref 0.61–1.24)
GFR, Estimated: 46 mL/min — ABNORMAL LOW (ref >60)
Glucose, Bld: 125 mg/dL — ABNORMAL HIGH (ref 70–99)
Potassium: 3.8 mmol/L (ref 3.5–5.1)
Sodium: 140 mmol/L (ref 135–145)

## 2021-01-11 LAB — CBC
HCT: 22.8 % — ABNORMAL LOW (ref 39.0–52.0)
Hemoglobin: 7.6 g/dL — ABNORMAL LOW (ref 13.0–17.0)
MCH: 30.9 pg (ref 26.0–34.0)
MCHC: 33.3 g/dL (ref 30.0–36.0)
MCV: 92.7 fL (ref 80.0–100.0)
Platelets: 77 10*3/uL — ABNORMAL LOW (ref 150–400)
RBC: 2.46 MIL/uL — ABNORMAL LOW (ref 4.22–5.81)
RDW: 15.9 % — ABNORMAL HIGH (ref 11.5–15.5)
WBC: 5.6 10*3/uL (ref 4.0–10.5)
nRBC: 0 % (ref 0.0–0.2)

## 2021-01-11 NOTE — Progress Notes (Signed)
Inpatient Rehab Admissions Coordinator Note:   Per therapy recommendations, pt was screened for CIR candidacy by Garyson Stelly, MS CCC-SLP. At this time, Pt. Appears to have functional decline and is a good candidate for CIR. Will place order for rehab consult per protocol.  Please contact me with questions.   Davian Hanshaw, MS, CCC-SLP Rehab Admissions Coordinator  [phone] (celll) [phone] (office)  
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
## 2021-01-11 NOTE — Progress Notes (Signed)
Inpatient Rehab Admissions Coordinator:   Met with patient at bedside to discuss potential CIR admission. Pt. Stated interest. Will pursue for potential admit next week, pending bed availability.  Rex Magee, MS, CCC-SLP Rehab Admissions Coordinator  [phone] (celll) [phone] (office) 
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
## 2021-01-11 NOTE — Plan of Care (Signed)
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
## 2021-01-12 NOTE — Progress Notes (Signed)
SBAR given to Tad Moore, RN.  Max assist x 2 for transfer to bedside commode then to wheelchair.  O34sats mid 78s with exertion, returned to 92% on 6L with rest.  Transferred to 4N14 via wheelchair and placed in bed without incident.  In no acute distress, bedside update given and two nurse neuro assessment completed.
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
## 2021-01-13 ENCOUNTER — Inpatient Hospital Stay (HOSPITAL_COMMUNITY): Payer: Medicare Other

## 2021-01-13 DIAGNOSIS — Z515 Encounter for palliative care: Secondary | ICD-10-CM | POA: Diagnosis not present

## 2021-01-13 DIAGNOSIS — Z7189 Other specified counseling: Secondary | ICD-10-CM | POA: Diagnosis not present

## 2021-01-13 LAB — BASIC METABOLIC PANEL
Anion gap: 6 (ref 5–15)
BUN: 27 mg/dL — ABNORMAL HIGH (ref 8–23)
CO2: 28 mmol/L (ref 22–32)
Calcium: 9 mg/dL (ref 8.9–10.3)
Chloride: 104 mmol/L (ref 98–111)
Creatinine, Ser: 1.1 mg/dL (ref 0.61–1.24)
GFR, Estimated: >60 mL/min (ref >60)
Glucose, Bld: 131 mg/dL — ABNORMAL HIGH (ref 70–99)
Potassium: 3.8 mmol/L (ref 3.5–5.1)
Sodium: 138 mmol/L (ref 135–145)

## 2021-01-13 LAB — CBC
HCT: 21.8 % — ABNORMAL LOW (ref 39.0–52.0)
Hemoglobin: 7.2 g/dL — ABNORMAL LOW (ref 13.0–17.0)
MCH: 31.2 pg (ref 26.0–34.0)
MCHC: 33 g/dL (ref 30.0–36.0)
MCV: 94.4 fL (ref 80.0–100.0)
Platelets: 136 10*3/uL — ABNORMAL LOW (ref 150–400)
RBC: 2.31 MIL/uL — ABNORMAL LOW (ref 4.22–5.81)
RDW: 15.5 % (ref 11.5–15.5)
WBC: 7.1 10*3/uL (ref 4.0–10.5)
nRBC: 0.4 % — ABNORMAL HIGH (ref 0.0–0.2)

## 2021-01-13 MED ORDER — FUROSEMIDE 10 MG/ML IJ SOLN
20.0000 mg | Freq: Once | INTRAMUSCULAR | Status: AC
  Administered 2021-01-13: 20 mg via INTRAVENOUS
  Filled 2021-01-13: qty 2

## 2021-01-13 MED ORDER — FUROSEMIDE 10 MG/ML IJ SOLN: 20.0000 mg | Freq: Once | INTRAMUSCULAR | Status: DC

## 2021-01-13 MED ORDER — BISACODYL 10 MG RE SUPP
10.0000 mg | Freq: Once | RECTAL | Status: AC
  Administered 2021-01-13: 10 mg via RECTAL
  Filled 2021-01-13: qty 1

## 2021-01-13 NOTE — Progress Notes (Signed)
Patient's O2 SATs maintaining in the 70's,  MD notified and RT called to bedside. Patient placed on Ventimask O2 SATs improved and maintaining.   Peggie Hornak, Tivis Ringer, RN
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
## 2021-01-13 NOTE — Care Management Important Message (Signed)
Important Message  Patient Details  Name: Corey Jordan MRN: [id number] Date of Birth: 12-28-1933   Medicare Important Message Given:  Yes     [name] 01/13/2021, 4:41 PM

## 2021-01-13 NOTE — Progress Notes (Signed)
RT called to bedside due to patient de-sat. Upon arrival patient on NRB sating 100%. RT placed patient on ventimask. Patient is currently sating 95% on ventimask 55% 14L. RN aware. RT will continue to monitor as needed.
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
## 2021-01-13 NOTE — Progress Notes (Signed)
PT Cancellation Note  Patient Details Name: Corey Jordan MRN: [id number] DOB: Aug 11, 1934   Cancelled Treatment:    Reason Eval/Treat Not Completed: Fatigue/lethargy limiting ability to participate - pt has been up in chair all day, resting in bed upon arrival to room. PT to check back tomorrow.  Stacie Glaze, PT Acute Rehabilitation Services Pager [phone]  Office [phone]    Louis Matte 01/13/2021, 4:35 PM

## 2021-01-13 NOTE — Plan of Care (Signed)
  Problem: Education: Goal: Knowledge of General Education information will improve Description Including pain rating scale, medication(s)/side effects and non-pharmacologic comfort measures Outcome: Progressing   Problem: Health Behavior/Discharge Planning: Goal: Ability to manage health-related needs will improve Outcome: Progressing   

## 2021-01-13 NOTE — Progress Notes (Signed)
Inpatient Rehab Admissions Coordinator:   I do not have a CIR bed for this pt. Today. Noted that Pt. desatted with exertion on 6L of oxygen, he does not appear medically ready for transfer to CIR at this time. I will follow and pursue for potential admit pending medical readiness and bed availability.   Clemens Catholic, [place], [place] Admissions Coordinator  [phone] ([place]) [phone] (office)
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
## 2021-01-14 ENCOUNTER — Inpatient Hospital Stay (HOSPITAL_COMMUNITY): Payer: Medicare Other

## 2021-01-14 LAB — CBC
HCT: 21.7 % — ABNORMAL LOW (ref 39.0–52.0)
Hemoglobin: 6.9 g/dL — CL (ref 13.0–17.0)
MCH: 30.7 pg (ref 26.0–34.0)
MCHC: 31.8 g/dL (ref 30.0–36.0)
MCV: 96.4 fL (ref 80.0–100.0)
Platelets: 166 10*3/uL (ref 150–400)
RBC: 2.25 MIL/uL — ABNORMAL LOW (ref 4.22–5.81)
RDW: 15.5 % (ref 11.5–15.5)
WBC: 8.5 10*3/uL (ref 4.0–10.5)
nRBC: 0.9 % — ABNORMAL HIGH (ref 0.0–0.2)

## 2021-01-14 LAB — BASIC METABOLIC PANEL
Anion gap: 7 (ref 5–15)
BUN: 32 mg/dL — ABNORMAL HIGH (ref 8–23)
CO2: 27 mmol/L (ref 22–32)
Calcium: 8.6 mg/dL — ABNORMAL LOW (ref 8.9–10.3)
Chloride: 106 mmol/L (ref 98–111)
Creatinine, Ser: 1.15 mg/dL (ref 0.61–1.24)
GFR, Estimated: >60 mL/min (ref >60)
Glucose, Bld: 121 mg/dL — ABNORMAL HIGH (ref 70–99)
Potassium: 3.3 mmol/L — ABNORMAL LOW (ref 3.5–5.1)
Sodium: 140 mmol/L (ref 135–145)

## 2021-01-14 LAB — PREPARE RBC (CROSSMATCH)

## 2021-01-14 LAB — MAGNESIUM: Magnesium: 2 mg/dL (ref 1.7–2.4)

## 2021-01-14 MED ORDER — FUROSEMIDE 10 MG/ML IJ SOLN
40.0000 mg | Freq: Once | INTRAMUSCULAR | Status: AC
  Administered 2021-01-14: 40 mg via INTRAVENOUS
  Filled 2021-01-14: qty 4

## 2021-01-14 MED ORDER — FUROSEMIDE 10 MG/ML IJ SOLN: 40.0000 mg | Freq: Once | INTRAMUSCULAR | Status: DC

## 2021-01-14 MED ORDER — POLYETHYLENE GLYCOL 3350 17 G PO PACK
17.0000 g | PACK | Freq: Two times a day (BID) | ORAL | Status: DC
  Administered 2021-01-14 – 2021-01-24 (×22): 17 g via ORAL
  Filled 2021-01-14 (×21): qty 1

## 2021-01-14 MED ORDER — SODIUM CHLORIDE 0.9% IV SOLUTION: Freq: Once | INTRAVENOUS | Status: AC

## 2021-01-14 MED ORDER — FUROSEMIDE 10 MG/ML IJ SOLN
60.0000 mg | Freq: Once | INTRAMUSCULAR | Status: AC
  Administered 2021-01-14: 60 mg via INTRAVENOUS
  Filled 2021-01-14: qty 6

## 2021-01-14 MED ORDER — POTASSIUM CHLORIDE 10 MEQ/100ML IV SOLN
10.0000 meq | INTRAVENOUS | Status: AC
  Administered 2021-01-14 (×3): 10 meq via INTRAVENOUS
  Filled 2021-01-14 (×3): qty 100

## 2021-01-14 NOTE — Progress Notes (Signed)
Inpatient Rehab Admissions Coordinator:   I do not have a CIR bed for this pt. Today. Pt. On non-rebreather today, not ready for CIR at this time.  Will follow for potential admit pending medical readiness and bed availability.  Clemens Catholic, [place], [place] Admissions Coordinator  [phone] ([place]) [phone] (office)
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
## 2021-01-15 LAB — BPAM RBC
Blood Product Expiration Date: 202203222359
ISSUE DATE / TIME: 202202221230
Unit Type and Rh: 5100

## 2021-01-15 LAB — CBC
HCT: 25.5 % — ABNORMAL LOW (ref 39.0–52.0)
Hemoglobin: 8.2 g/dL — ABNORMAL LOW (ref 13.0–17.0)
MCH: 29.4 pg (ref 26.0–34.0)
MCHC: 32.2 g/dL (ref 30.0–36.0)
MCV: 91.4 fL (ref 80.0–100.0)
Platelets: 141 10*3/uL — ABNORMAL LOW (ref 150–400)
RBC: 2.79 MIL/uL — ABNORMAL LOW (ref 4.22–5.81)
RDW: 18.5 % — ABNORMAL HIGH (ref 11.5–15.5)
WBC: 8.9 10*3/uL (ref 4.0–10.5)
nRBC: 1 % — ABNORMAL HIGH (ref 0.0–0.2)

## 2021-01-15 LAB — TYPE AND SCREEN
ABO/RH(D): O POS
Antibody Screen: NEGATIVE
Unit division: 0

## 2021-01-15 LAB — BASIC METABOLIC PANEL
Anion gap: 10 (ref 5–15)
BUN: 32 mg/dL — ABNORMAL HIGH (ref 8–23)
CO2: 30 mmol/L (ref 22–32)
Calcium: 9.1 mg/dL (ref 8.9–10.3)
Chloride: 101 mmol/L (ref 98–111)
Creatinine, Ser: 1.13 mg/dL (ref 0.61–1.24)
GFR, Estimated: >60 mL/min (ref >60)
Glucose, Bld: 107 mg/dL — ABNORMAL HIGH (ref 70–99)
Potassium: 3.2 mmol/L — ABNORMAL LOW (ref 3.5–5.1)
Sodium: 141 mmol/L (ref 135–145)

## 2021-01-15 LAB — MAGNESIUM: Magnesium: 2 mg/dL (ref 1.7–2.4)

## 2021-01-15 LAB — PHOSPHORUS: Phosphorus: 2.2 mg/dL — ABNORMAL LOW (ref 2.5–4.6)

## 2021-01-15 MED ORDER — POTASSIUM CHLORIDE 10 MEQ/100ML IV SOLN
10.0000 meq | INTRAVENOUS | Status: AC
  Administered 2021-01-15 (×4): 10 meq via INTRAVENOUS
  Filled 2021-01-15 (×4): qty 100

## 2021-01-15 MED ORDER — ENOXAPARIN SODIUM 30 MG/0.3ML ~~LOC~~ SOLN
30.0000 mg | SUBCUTANEOUS | Status: DC
  Administered 2021-01-15 – 2021-01-16 (×2): 30 mg via SUBCUTANEOUS
  Filled 2021-01-15 (×2): qty 0.3

## 2021-01-15 MED ORDER — FUROSEMIDE 10 MG/ML IJ SOLN
20.0000 mg | Freq: Once | INTRAMUSCULAR | Status: AC
  Administered 2021-01-15: 20 mg via INTRAVENOUS
  Filled 2021-01-15: qty 2

## 2021-01-15 MED ORDER — POTASSIUM CHLORIDE 20 MEQ PO PACK
40.0000 meq | PACK | ORAL | Status: AC
  Administered 2021-01-15 – 2021-01-16 (×3): 40 meq via ORAL
  Filled 2021-01-15 (×3): qty 2
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
## 2021-01-15 NOTE — Progress Notes (Signed)
1314 Patient peripheral  IV line in his left AC became infiltrated during the end his potassium infusions. The fluid drained from his IV site to his arm and it heavily saturated the patients resting arm pillow plus top right of sheets. Patient was provided peri care along with his bed linens being changed and also was placed on a bedpan where he passed flatus and had a very small liquid stool. After the patient was clean I removed his IV from his left [facility] and inserted a 22G IV into his right arm with [place].   Shelbie Proctor, RN

## 2021-01-16 ENCOUNTER — Inpatient Hospital Stay (HOSPITAL_COMMUNITY): Payer: Medicare Other

## 2021-01-16 LAB — BASIC METABOLIC PANEL
Anion gap: 12 (ref 5–15)
BUN: 31 mg/dL — ABNORMAL HIGH (ref 8–23)
CO2: 26 mmol/L (ref 22–32)
Calcium: 9.4 mg/dL (ref 8.9–10.3)
Chloride: 103 mmol/L (ref 98–111)
Creatinine, Ser: 1.07 mg/dL (ref 0.61–1.24)
GFR, Estimated: >60 mL/min (ref >60)
Glucose, Bld: 105 mg/dL — ABNORMAL HIGH (ref 70–99)
Potassium: 4.8 mmol/L (ref 3.5–5.1)
Sodium: 141 mmol/L (ref 135–145)

## 2021-01-16 LAB — MAGNESIUM: Magnesium: 2.1 mg/dL (ref 1.7–2.4)

## 2021-01-16 LAB — PHOSPHORUS: Phosphorus: 2.3 mg/dL — ABNORMAL LOW (ref 2.5–4.6)

## 2021-01-16 MED ORDER — BISACODYL 10 MG RE SUPP
10.0000 mg | Freq: Once | RECTAL | Status: AC
  Administered 2021-01-16: 10 mg via RECTAL
  Filled 2021-01-16: qty 1
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
## 2021-01-16 NOTE — Progress Notes (Signed)
1518 Patient has been tapered down from 10L to 8L and has tolerated this fairly well since 1200, O2 levels remaining in between 93-95%. PT is beginning to have more frequent bowel movements type 7.   Shelbie Proctor, RN
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
## 2021-01-16 NOTE — Progress Notes (Signed)
IP rehab admissions - continuing to follow.  Noted hypoxic overnight with O2 usage up to 15L.  Not medically ready for CIR at this time.  Call for questions.  [phone]

## 2021-01-17 ENCOUNTER — Inpatient Hospital Stay (HOSPITAL_COMMUNITY): Payer: Medicare Other

## 2021-01-17 DIAGNOSIS — Z7189 Other specified counseling: Secondary | ICD-10-CM | POA: Diagnosis not present

## 2021-01-17 DIAGNOSIS — Z515 Encounter for palliative care: Secondary | ICD-10-CM | POA: Diagnosis not present

## 2021-01-17 DIAGNOSIS — R102 Pelvic and perineal pain: Secondary | ICD-10-CM

## 2021-01-17 DIAGNOSIS — T1490XA Injury, unspecified, initial encounter: Secondary | ICD-10-CM

## 2021-01-17 DIAGNOSIS — Z66 Do not resuscitate: Secondary | ICD-10-CM | POA: Diagnosis not present

## 2021-01-17 LAB — CBC
HCT: 29.5 % — ABNORMAL LOW (ref 39.0–52.0)
Hemoglobin: 9.1 g/dL — ABNORMAL LOW (ref 13.0–17.0)
MCH: 28.6 pg (ref 26.0–34.0)
MCHC: 30.8 g/dL (ref 30.0–36.0)
MCV: 92.8 fL (ref 80.0–100.0)
Platelets: 214 10*3/uL (ref 150–400)
RBC: 3.18 MIL/uL — ABNORMAL LOW (ref 4.22–5.81)
RDW: 17.6 % — ABNORMAL HIGH (ref 11.5–15.5)
WBC: 10.8 10*3/uL — ABNORMAL HIGH (ref 4.0–10.5)
nRBC: 0.3 % — ABNORMAL HIGH (ref 0.0–0.2)

## 2021-01-17 LAB — BASIC METABOLIC PANEL
Anion gap: 11 (ref 5–15)
BUN: 35 mg/dL — ABNORMAL HIGH (ref 8–23)
CO2: 27 mmol/L (ref 22–32)
Calcium: 9.5 mg/dL (ref 8.9–10.3)
Chloride: 102 mmol/L (ref 98–111)
Creatinine, Ser: 1.03 mg/dL (ref 0.61–1.24)
GFR, Estimated: >60 mL/min (ref >60)
Glucose, Bld: 109 mg/dL — ABNORMAL HIGH (ref 70–99)
Potassium: 3.8 mmol/L (ref 3.5–5.1)
Sodium: 140 mmol/L (ref 135–145)

## 2021-01-17 LAB — MAGNESIUM: Magnesium: 2.1 mg/dL (ref 1.7–2.4)

## 2021-01-17 LAB — PHOSPHORUS: Phosphorus: 2.8 mg/dL (ref 2.5–4.6)

## 2021-01-17 MED ORDER — IPRATROPIUM-ALBUTEROL 0.5-2.5 (3) MG/3ML IN SOLN
3.0000 mL | Freq: Four times a day (QID) | RESPIRATORY_TRACT | Status: DC
  Administered 2021-01-17 – 2021-01-18 (×3): 3 mL via RESPIRATORY_TRACT
  Filled 2021-01-17 (×3): qty 3

## 2021-01-17 MED ORDER — FUROSEMIDE 10 MG/ML IJ SOLN
20.0000 mg | Freq: Once | INTRAMUSCULAR | Status: AC
  Administered 2021-01-17: 20 mg via INTRAVENOUS
  Filled 2021-01-17: qty 2

## 2021-01-17 MED ORDER — ASPIRIN EC 81 MG PO TBEC
81.0000 mg | DELAYED_RELEASE_TABLET | Freq: Every day | ORAL | Status: DC
  Administered 2021-01-17 – 2021-01-24 (×8): 81 mg via ORAL
  Filled 2021-01-17 (×8): qty 1

## 2021-01-17 MED ORDER — METOPROLOL TARTRATE 12.5 MG HALF TABLET
12.5000 mg | ORAL_TABLET | Freq: Two times a day (BID) | ORAL | Status: DC
  Administered 2021-01-17 – 2021-01-24 (×15): 12.5 mg via ORAL
  Filled 2021-01-17 (×16): qty 1

## 2021-01-17 MED ORDER — ACETAMINOPHEN 325 MG PO TABS
650.0000 mg | ORAL_TABLET | Freq: Three times a day (TID) | ORAL | Status: DC
  Administered 2021-01-17 – 2021-01-24 (×22): 650 mg via ORAL
  Filled 2021-01-17 (×22): qty 2

## 2021-01-17 MED ORDER — ENOXAPARIN SODIUM 40 MG/0.4ML ~~LOC~~ SOLN
40.0000 mg | SUBCUTANEOUS | Status: DC
  Administered 2021-01-17 – 2021-01-24 (×7): 40 mg via SUBCUTANEOUS
  Filled 2021-01-17 (×7): qty 0.4

## 2021-01-17 MED ORDER — ATORVASTATIN CALCIUM 40 MG PO TABS
40.0000 mg | ORAL_TABLET | Freq: Every day | ORAL | Status: DC
  Administered 2021-01-17 – 2021-01-24 (×8): 40 mg via ORAL
  Filled 2021-01-17 (×3): qty 1
  Filled 2021-01-17: qty 4
  Filled 2021-01-17 (×4): qty 1
  Filled 2021-01-17: qty 4

## 2021-01-17 NOTE — Progress Notes (Signed)
   01/17/21 1452  Oxygen Therapy/Pulse Ox  O2 Device HFNC  O2 Therapy Oxygen humidified (Pt has wounds on his ears from the inner cannula.  RT placed the HFNC on the back of his head to relieve the pressure from his ears, stable, working properly.)  O2 Flow Rate (L/min) 10 L/min  SpO2 96 %
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
## 2021-01-17 NOTE — Progress Notes (Signed)
2034-Patient has been ween to 6L HFNC sating at 97%  2330- Patient is maintaining  sats without any obvious distress.

## 2021-01-17 NOTE — Progress Notes (Signed)
0700- This nurse didn't receive a phone call on patient. No new orders at time.
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
## 2021-01-17 NOTE — Progress Notes (Addendum)
0537-After nurse and tech turn patient to change. Pt HR went up to 140s sustained with active afib. Patient stated that he did not feel differently from earlier.    Nurse gave patient prn metoprolol for HR. Will continue to monitor patient.   2241- Patients current HR is 115   Paged on call trauma MD Wilson awaiting new orders

## 2021-01-17 NOTE — Progress Notes (Signed)
Inpatient Rehab Admissions Coordinator:  Noted pt's oxygen requirements have been fluctuating since 01/15/21 from 15L, 6L, 10L, and currently 5L.   Notified pt that there are no beds available in CIR today.  Will continue to follow.   Gayland Curry, [place], [place] Admissions Coordinator [phone]
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
## 2021-01-18 DIAGNOSIS — Z7189 Other specified counseling: Secondary | ICD-10-CM | POA: Diagnosis not present

## 2021-01-18 DIAGNOSIS — R102 Pelvic and perineal pain: Secondary | ICD-10-CM | POA: Diagnosis not present

## 2021-01-18 DIAGNOSIS — Z515 Encounter for palliative care: Secondary | ICD-10-CM | POA: Diagnosis not present

## 2021-01-18 DIAGNOSIS — Z66 Do not resuscitate: Secondary | ICD-10-CM | POA: Diagnosis not present

## 2021-01-18 LAB — MAGNESIUM: Magnesium: 2.3 mg/dL (ref 1.7–2.4)

## 2021-01-18 LAB — BASIC METABOLIC PANEL
Anion gap: 7 (ref 5–15)
BUN: 35 mg/dL — ABNORMAL HIGH (ref 8–23)
CO2: 28 mmol/L (ref 22–32)
Calcium: 9.2 mg/dL (ref 8.9–10.3)
Chloride: 104 mmol/L (ref 98–111)
Creatinine, Ser: 0.99 mg/dL (ref 0.61–1.24)
GFR, Estimated: >60 mL/min (ref >60)
Glucose, Bld: 109 mg/dL — ABNORMAL HIGH (ref 70–99)
Potassium: 4.5 mmol/L (ref 3.5–5.1)
Sodium: 139 mmol/L (ref 135–145)

## 2021-01-18 LAB — PHOSPHORUS: Phosphorus: 2.6 mg/dL (ref 2.5–4.6)

## 2021-01-18 MED ORDER — IPRATROPIUM-ALBUTEROL 0.5-2.5 (3) MG/3ML IN SOLN
3.0000 mL | Freq: Two times a day (BID) | RESPIRATORY_TRACT | Status: DC
  Administered 2021-01-18 – 2021-01-20 (×5): 3 mL via RESPIRATORY_TRACT
  Filled 2021-01-18 (×5): qty 3

## 2021-01-18 MED ORDER — FUROSEMIDE 10 MG/ML IJ SOLN
20.0000 mg | Freq: Once | INTRAMUSCULAR | Status: AC
  Administered 2021-01-18: 20 mg via INTRAVENOUS
  Filled 2021-01-18: qty 2

## 2021-01-18 NOTE — Progress Notes (Addendum)
PT has been tapered down from 6LPM to 5 LPM beginning of shift during initial assessment and has tolerated this well being at 95-97% O 2.PT is still having liquidly BM's but with more output. Assessed sore behind patients ear and applied a barrier cream. PT is motivated and has used incentive spirometry countless times throughout the day. PT also states he can not breath through his nose (well) and that this was a problem he's had PTA.   Shelbie Proctor, RN
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
## 2021-01-19 ENCOUNTER — Inpatient Hospital Stay (HOSPITAL_COMMUNITY): Payer: Medicare Other

## 2021-01-19 DIAGNOSIS — Z515 Encounter for palliative care: Secondary | ICD-10-CM | POA: Diagnosis not present

## 2021-01-19 DIAGNOSIS — Z66 Do not resuscitate: Secondary | ICD-10-CM | POA: Diagnosis not present

## 2021-01-19 DIAGNOSIS — Z7189 Other specified counseling: Secondary | ICD-10-CM | POA: Diagnosis not present

## 2021-01-19 LAB — BASIC METABOLIC PANEL
Anion gap: 9 (ref 5–15)
BUN: 27 mg/dL — ABNORMAL HIGH (ref 8–23)
CO2: 30 mmol/L (ref 22–32)
Calcium: 9.2 mg/dL (ref 8.9–10.3)
Chloride: 101 mmol/L (ref 98–111)
Creatinine, Ser: 0.93 mg/dL (ref 0.61–1.24)
GFR, Estimated: >60 mL/min (ref >60)
Glucose, Bld: 115 mg/dL — ABNORMAL HIGH (ref 70–99)
Potassium: 3.5 mmol/L (ref 3.5–5.1)
Sodium: 140 mmol/L (ref 135–145)

## 2021-01-19 LAB — MAGNESIUM: Magnesium: 2.1 mg/dL (ref 1.7–2.4)

## 2021-01-19 LAB — PHOSPHORUS: Phosphorus: 2.1 mg/dL — ABNORMAL LOW (ref 2.5–4.6)

## 2021-01-19 MED ORDER — LIDOCAINE 5 % EX PTCH
1.0000 | MEDICATED_PATCH | CUTANEOUS | Status: DC
  Administered 2021-01-19 – 2021-01-24 (×5): 1 via TRANSDERMAL
  Filled 2021-01-19 (×5): qty 1
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
## 2021-01-19 NOTE — Consult Note (Signed)
Responded to consult. Brought pt prayer shawl, which he appreciated as sign of spiritual support, but did not wish draped over him then as he said he was warm. Provided spiritual support and prayer at pt's request. He is [facility], He thanked me for praying the 23rd Psalm with him and appreciated my coming, blessing me at my going. Pt would likely welcome and appreciate further spiritual support. Will ask day chaplain to F/U, and please feel free to call whenever pt desires further chaplain services.   Rev. Eloise Levels Chaplain

## 2021-01-20 DIAGNOSIS — Z7189 Other specified counseling: Secondary | ICD-10-CM | POA: Diagnosis not present

## 2021-01-20 DIAGNOSIS — Z66 Do not resuscitate: Secondary | ICD-10-CM | POA: Diagnosis not present

## 2021-01-20 DIAGNOSIS — Z515 Encounter for palliative care: Secondary | ICD-10-CM | POA: Diagnosis not present

## 2021-01-20 LAB — BASIC METABOLIC PANEL
Anion gap: 7 (ref 5–15)
BUN: 32 mg/dL — ABNORMAL HIGH (ref 8–23)
CO2: 29 mmol/L (ref 22–32)
Calcium: 9.4 mg/dL (ref 8.9–10.3)
Chloride: 100 mmol/L (ref 98–111)
Creatinine, Ser: 0.97 mg/dL (ref 0.61–1.24)
GFR, Estimated: >60 mL/min (ref >60)
Glucose, Bld: 115 mg/dL — ABNORMAL HIGH (ref 70–99)
Potassium: 3.7 mmol/L (ref 3.5–5.1)
Sodium: 136 mmol/L (ref 135–145)

## 2021-01-20 LAB — PHOSPHORUS: Phosphorus: 2.6 mg/dL (ref 2.5–4.6)

## 2021-01-20 LAB — MAGNESIUM: Magnesium: 2.1 mg/dL (ref 1.7–2.4)
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
## 2021-01-20 NOTE — Progress Notes (Signed)
This chaplain is present with the Pt. for ongoing spiritual care.  The Pt. is awake in the bedside chair. The chaplain understands the Pt. pain is a 1-2, but his struggle to breath through his mouth is causing him distress.  The Pt. speaks of his faith in God as a place of comfort when he feels discouraged and often  apologizes for limited conversation on the same topic.  The Pt. accepted the chaplain's offer to call Cyndia Skeeters from [facility].  A voicemail requesting a clergy visit was left at [phone].  The Pt. accepted the chaplain's invitation for F/U spiritual care and an additional box of tissue.  The chaplain updated the RN of a possible visit.

## 2021-01-20 NOTE — Progress Notes (Signed)
Inpatient Rehab Admissions Coordinator:  Informed pt there are no CIR beds available today.  Will continue to follow.   Gayland Curry, [place], [place] Admissions Coordinator [phone]
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
## 2021-01-21 ENCOUNTER — Inpatient Hospital Stay (HOSPITAL_COMMUNITY): Payer: Medicare Other

## 2021-01-21 MED ORDER — IPRATROPIUM-ALBUTEROL 0.5-2.5 (3) MG/3ML IN SOLN: 3.0000 mL | Freq: Four times a day (QID) | RESPIRATORY_TRACT | Status: DC | PRN
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
## 2021-01-21 NOTE — Progress Notes (Signed)
This chaplain is present for a quick F/U spiritual care visit before PT.  The Pt. is eating his breakfast and responds to the chaplain with more strength.  The chaplain understands the Pt. clergy did not contact the Pt. The chaplain will F/U with a phone call to [facility].  The chaplain updated the RN and will F/U as needed.

## 2021-01-21 NOTE — Progress Notes (Signed)
Inpatient Rehab Admissions Coordinator:   Pt. States he is not sure if he can do 3 hours of daily therapy, would like to look into SNF for short term rehab. I have reached out to case manager for a list of local SNFs.  Clemens Catholic, [place], [place] Admissions Coordinator  [phone] ([place]) [phone] (office)

## 2021-01-21 NOTE — Progress Notes (Signed)
Inpatient Rehab Admissions Coordinator:   I do not have a bed on CIR for this pt. Today. I do have some concerns about Pt.'s ability to tolerate CIR level therapies, with Pt. Requiring up to 6L of SpO2 with exertion.   Clemens Catholic, [place], [place] Admissions Coordinator  [phone] ([place]) [phone] (office)
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
## 2021-01-22 ENCOUNTER — Ambulatory Visit (INDEPENDENT_AMBULATORY_CARE_PROVIDER_SITE_OTHER): Payer: Medicare Other | Admitting: Otolaryngology

## 2021-01-22 MED ORDER — MORPHINE SULFATE (PF) 2 MG/ML IV SOLN
2.0000 mg | INTRAVENOUS | Status: DC | PRN
Start: 2021-01-22 — End: 2021-01-25

## 2021-01-22 MED ORDER — GUAIFENESIN 100 MG/5ML PO SOLN: 5.0000 mL | ORAL | Status: DC | PRN
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
## 2021-01-22 NOTE — Progress Notes (Signed)
Inpatient Rehab Admissions Coordinator:   Therapy has updated recs to SNF, Pt. Confirms that he does not feel he can manage 3 hours of therapy every day. CIR will sign off at this time.   Clemens Catholic, [place], [place] Admissions Coordinator  [phone] ([place]) [phone] (office)
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
## 2021-01-23 LAB — BASIC METABOLIC PANEL
Anion gap: 7 (ref 5–15)
BUN: 23 mg/dL (ref 8–23)
CO2: 28 mmol/L (ref 22–32)
Calcium: 9.2 mg/dL (ref 8.9–10.3)
Chloride: 97 mmol/L — ABNORMAL LOW (ref 98–111)
Creatinine, Ser: 0.94 mg/dL (ref 0.61–1.24)
GFR, Estimated: >60 mL/min (ref >60)
Glucose, Bld: 108 mg/dL — ABNORMAL HIGH (ref 70–99)
Potassium: 3.8 mmol/L (ref 3.5–5.1)
Sodium: 132 mmol/L — ABNORMAL LOW (ref 135–145)

## 2021-01-23 MED ORDER — IPRATROPIUM-ALBUTEROL 0.5-2.5 (3) MG/3ML IN SOLN
3.0000 mL | Freq: Four times a day (QID) | RESPIRATORY_TRACT | Status: AC
  Administered 2021-01-23 (×3): 3 mL via RESPIRATORY_TRACT
  Filled 2021-01-23 (×2): qty 3

## 2021-01-23 MED ORDER — IPRATROPIUM-ALBUTEROL 0.5-2.5 (3) MG/3ML IN SOLN
RESPIRATORY_TRACT | Status: AC
  Administered 2021-01-23: 3 mL via RESPIRATORY_TRACT
  Filled 2021-01-23: qty 3

## 2021-01-23 NOTE — TOC Progression Note (Signed)
Transition of Care [facility]) - Progression Note    Patient Details  Name: ZEPPELIN COMMISSO MRN: [id number] Date of Birth: Sep 07, 1934  [facility] CM/SW Contact  Ella Bodo, RN Phone Number: 01/23/2021, 10:08 AM  Clinical Narrative:  SNF bed offers have been given to patient's step-son, Gerald Stabs.  Will follow up with family later in the day for decision.      Expected Discharge Plan: [place] Barriers to Discharge: Continued Medical Work up  Expected Discharge Plan and Services Expected Discharge Plan: [place]   Discharge Planning Services: CM Consult Post Acute Care Choice: IP Rehab Living arrangements for the past 2 months: Single Family Home                                       Social Determinants of Health (SDOH) Interventions    Readmission Risk Interventions No flowsheet data found.  Reinaldo Raddle, RN, BSN  Trauma/Neuro ICU Case Manager [phone]
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
## 2021-01-24 LAB — RESP PANEL BY RT-PCR (FLU A&B, COVID) ARPGX2
Influenza A by PCR: NEGATIVE
Influenza B by PCR: NEGATIVE
SARS Coronavirus 2 by RT PCR: NEGATIVE

## 2021-01-24 MED ORDER — ACETAMINOPHEN 325 MG PO TABS: 650.0000 mg | ORAL_TABLET | Freq: Four times a day (QID) | ORAL | Status: DC | PRN

## 2021-01-24 MED ORDER — OXYCODONE HCL 5 MG PO TABS: 2.5000 mg | ORAL_TABLET | ORAL | 0 refills | Status: AC | PRN

## 2021-01-24 MED ORDER — GUAIFENESIN 100 MG/5ML PO SOLN: 5.0000 mL | ORAL | 0 refills | Status: DC | PRN

## 2021-01-24 MED ORDER — BISACODYL 10 MG RE SUPP
10.0000 mg | Freq: Once | RECTAL | Status: AC
  Administered 2021-01-24: 10 mg via RECTAL
  Filled 2021-01-24: qty 1

## 2021-01-24 MED ORDER — IPRATROPIUM-ALBUTEROL 0.5-2.5 (3) MG/3ML IN SOLN: 3.0000 mL | Freq: Four times a day (QID) | RESPIRATORY_TRACT | Status: DC | PRN

## 2021-01-24 NOTE — Progress Notes (Addendum)
   Palliative Medicine Inpatient Follow Up Note  Subjective: I met with Corey Jordan this morning. He was resting without any signs of distress, weaned O2 down.   Per discussion with bedside RN, Corey Jordan there was concern in regards to Corey Jordan's decreased PO's and feeling of hopelessness. I attempted to call Corey Jordan's step-son, Corey Jordan as did the general surgery team though there was no answer therefore a VM was left.  Plan for Corey Jordan to transition to Corey Jordan.  Objective: Vitals:   01/24/21 0742 01/24/21 1158  BP: 133/61 (!) 111/54  Pulse: 89 85  Resp: 16   Temp: 98 F (36.7 C) 98 F (36.7 C)  SpO2: 91% 90%   GNO:IBBCW, elderly caucasian M in NAD HEENT: moist mucous membranes CV: Irregular rate andirregularrhythm  PULM:4LPM [place] ABD: soft/nontender  EXT: No edema- ecchymosis in BUE Neuro: Alert and oriented x4  SUMMARY OF RECOMMENDATIONS DNAR/DNI  MOST Completed, paper copy placed onto the chart electric copy can be found in [place]  DNR Form Completed, paper copy placed onto the chart electric copy can be found in [place] completed by [employer]  Appreciate spiritual support  Patient will need comprehensive OP Palliative FU for ongoing [place] conversations - Have messaged [facility] liaison in regards to this  Plan for discharge to Corey Jordan. I worry [name] has a high probability for potential decline in the setting of < PO's, muscular deconditioning, co-morbidities, and ongoing dyspnea.  Time Spent: 15 Greater than 50% of the time was spent in counseling and coordination of care ______________________________________________________________________________________ [place] Team Team Cell Phone: [phone] Please utilize secure chat with additional questions, if there is no response within 30 minutes please call the above phone number  Palliative Medicine Team providers are available by phone from 7am  to 7pm daily and can be reached through the team cell phone.  Should this patient require assistance outside of these hours, please call the patient's attending physician.

## 2021-01-24 NOTE — Progress Notes (Signed)
[facility] ([facility])  Hospital Liaison RN note         Notified by TOC manager of patient/family request for [facility] Palliative services at [facility] after discharge.      [facility] Palliative team will follow up with patient after discharge.         Please call with any hospice or palliative related questions.         Thank you for the opportunity to participate in this patient's care.     Chrislyn King, BSN, RN [facility] Hospital Liaison (listed on [facility] under Hospice/[facility])    [phone] (24h on call) 

## 2021-01-24 NOTE — Discharge Instructions (Signed)
Weight bearing as tolerated to bilateral lower extremities  RIB FRACTURES  HOME INSTRUCTIONS   1. PAIN CONTROL:  1. Pain is best controlled by a usual combination of three different methods TOGETHER:  i. Ice/Heat ii. Over the counter pain medication iii. Prescription pain medication 2. You may experience some swelling and bruising in area of broken ribs. Ice packs or heating pads (30-60 minutes up to 6 times a day) will help. Use ice for the first few days to help decrease swelling and bruising, then switch to heat to help relax tight/sore spots and speed recovery. Some people prefer to use ice alone, heat alone, alternating between ice & heat. Experiment to what works for you. Swelling and bruising can take several weeks to resolve.  3. It is helpful to take an over-the-counter pain medication regularly for the first few weeks. Choose one of the following that works best for you:  i. Naproxen (Aleve, etc) Two 220mg  tabs twice a day ii. Ibuprofen (Advil, etc) Three 200mg  tabs four times a day (every meal & bedtime) iii. Acetaminophen (Tylenol, etc) 500-650mg  four times a day (every meal & bedtime) 4. A prescription for pain medication (such as oxycodone, hydrocodone, etc) may be given to you upon discharge. Take your pain medication as prescribed.  i. If you are having problems/concerns with the prescription medicine (does not control pain, nausea, vomiting, rash, itching, etc), please call us [phone] to see if we need to switch you to a different pain medicine that will work better for you and/or control your side effect better. ii. If you need a refill on your pain medication, please contact your pharmacy. They will contact our office to request authorization. Prescriptions will not be filled after 5 pm or on week-ends. 1. Avoid getting constipated. When taking pain medications, it is common to experience some constipation. Increasing fluid intake and taking a fiber supplement (such as  Metamucil, Citrucel, FiberCon, MiraLax, etc) 1-2 times a day regularly will usually help prevent this problem from occurring. A mild laxative (prune juice, Milk of Magnesia, MiraLax, etc) should be taken according to package directions if there are no bowel movements after 48 hours.  2. Watch out for diarrhea. If you have many loose bowel movements, simplify your diet to bland foods & liquids for a few days. Stop any stool softeners and decrease your fiber supplement. Switching to mild anti-diarrheal medications (Kayopectate, Pepto Bismol) can help. If this worsens or does not improve, please call us. 3. FOLLOW UP  a. If a follow up appointment is needed one will be scheduled for you. If none is needed with our trauma team, please follow up with your primary care provider within 2-3 weeks from discharge. Please call [facility] at [phone] if you have any questions about follow up.  b. If you have any orthopedic or other injuries you will need to follow up as outlined in your follow up instructions.   WHEN TO CALL us [phone]:  1. Poor pain control 2. Reactions / problems with new medications (rash/itching, nausea, etc)  3. Fever over 101.5 F (38.5 C) 4. Worsening swelling or bruising 5. Worsening pain, productive cough, difficulty breathing or any other concerning symptoms  The clinic staff is available to answer your questions during regular business hours (8:30am-5pm). Please don't hesitate to call and ask to speak to one of our nurses for clinical concerns.  If you have a medical emergency, go to the nearest emergency room or call 911.  A  surgeon from [facility] is always on call at the [facility], [place], [place], [place], [place] [address] ?  MAIN: [phone] ? TOLL FREE: [phone] ?  FAX [phone]  [email or web address]      Information on Rib Fractures  A rib fracture is a break or crack in one  of the bones of the ribs. The ribs are long, curved bones that wrap around your chest and attach to your spine and your breastbone. The ribs protect your heart, lungs, and other organs in the chest. A broken or cracked rib is often painful but is not usually serious. Most rib fractures heal on their own over time. However, rib fractures can be more serious if multiple ribs are broken or if broken ribs move out of place and push against other structures or organs. What are the causes? This condition is caused by:  Repetitive movements with high force, such as pitching a baseball or having severe coughing spells.  A direct blow to the chest, such as a sports injury, a car accident, or a fall.  Cancer that has spread to the bones, which can weaken bones and cause them to break. What are the signs or symptoms? Symptoms of this condition include:  Pain when you breathe in or cough.  Pain when someone presses on the injured area.  Feeling short of breath. How is this diagnosed? This condition is diagnosed with a physical exam and medical history. Imaging tests may also be done, such as:  Chest X-ray.  CT scan.  MRI.  Bone scan.  Chest ultrasound. How is this treated? Treatment for this condition depends on the severity of the fracture. Most rib fractures usually heal on their own in 1-3 months. Sometimes healing takes longer if there is a cough that does not stop or if there are other activities that make the injury worse (aggravating factors). While you heal, you will be given medicines to control the pain. You will also be taught deep breathing exercises. Severe injuries may require hospitalization or surgery. Follow these instructions at home: Managing pain, stiffness, and swelling  If directed, apply ice to the injured area. ? Put ice in a plastic bag. ? Place a towel between your skin and the bag. ? Leave the ice on for 20 minutes, 2-3 times a day.  Take over-the-counter and  prescription medicines only as told by your health care provider. Activity  Avoid a lot of activity and any activities or movements that cause pain. Be careful during activities and avoid bumping the injured rib.  Slowly increase your activity as told by your health care provider. General instructions  Do deep breathing exercises as told by your health care provider. This helps prevent pneumonia, which is a common complication of a broken rib. Your health care provider may instruct you to: ? Take deep breaths several times a day. ? Try to cough several times a day, holding a pillow against the injured area. ? Use a device called incentive spirometer to practice deep breathing several times a day.  Drink enough fluid to keep your urine pale yellow.  Do not wear a rib belt or binder. These restrict breathing, which can lead to pneumonia.  Keep all follow-up visits as told by your health care provider. This is important. Contact a health care provider if:  You have a fever. Get help right away if:  You have difficulty breathing or you are short  of breath.  You develop a cough that does not stop, or you cough up thick or bloody sputum.  You have nausea, vomiting, or pain in your abdomen.  Your pain gets worse and medicine does not help. Summary  A rib fracture is a break or crack in one of the bones of the ribs.  A broken or cracked rib is often painful but is not usually serious.  Most rib fractures heal on their own over time.  Treatment for this condition depends on the severity of the fracture.  Avoid a lot of activity and any activities or movements that cause pain. This information is not intended to replace advice given to you by your health care provider. Make sure you discuss any questions you have with your health care provider. Document Released: 11/09/2005 Document Revised: 02/08/2017 Document Reviewed: 02/08/2017 Elsevier Interactive Patient Education  2019  [employer].
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
## 2021-01-24 NOTE — Progress Notes (Signed)
Attempted to call report to [facility], will try again
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
## 2021-01-24 NOTE — Progress Notes (Signed)
This chaplain is present to offer F/U spiritual care.  The chaplain is updated by the Pt. RN before the visit.    The Pt. is sitting up in bed eating lunch.  The chaplain and Pt. agree for the chaplain to return for a visit after he finishes his lunch.    The Pt. understands  the F/U visit will be as time permits.
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
## 2021-01-24 NOTE — Progress Notes (Signed)
Gave report to [place] at [employer]. Answered all questions.

## 2021-01-25 DIAGNOSIS — L89626 Pressure-induced deep tissue damage of left heel: Secondary | ICD-10-CM | POA: Diagnosis not present

## 2021-01-25 DIAGNOSIS — K567 Ileus, unspecified: Secondary | ICD-10-CM | POA: Diagnosis not present

## 2021-01-25 DIAGNOSIS — E785 Hyperlipidemia, unspecified: Secondary | ICD-10-CM | POA: Diagnosis not present

## 2021-01-25 DIAGNOSIS — R062 Wheezing: Secondary | ICD-10-CM | POA: Diagnosis not present

## 2021-01-25 DIAGNOSIS — R451 Restlessness and agitation: Secondary | ICD-10-CM | POA: Diagnosis not present

## 2021-01-25 DIAGNOSIS — I509 Heart failure, unspecified: Secondary | ICD-10-CM | POA: Diagnosis not present

## 2021-01-25 DIAGNOSIS — M25562 Pain in left knee: Secondary | ICD-10-CM | POA: Diagnosis not present

## 2021-01-25 DIAGNOSIS — S32599D Other specified fracture of unspecified pubis, subsequent encounter for fracture with routine healing: Secondary | ICD-10-CM | POA: Diagnosis not present

## 2021-01-25 DIAGNOSIS — R5383 Other fatigue: Secondary | ICD-10-CM | POA: Diagnosis not present

## 2021-01-25 DIAGNOSIS — G629 Polyneuropathy, unspecified: Secondary | ICD-10-CM | POA: Diagnosis not present

## 2021-01-25 DIAGNOSIS — S3282XA Multiple fractures of pelvis without disruption of pelvic ring, initial encounter for closed fracture: Secondary | ICD-10-CM | POA: Diagnosis not present

## 2021-01-25 DIAGNOSIS — L89610 Pressure ulcer of right heel, unstageable: Secondary | ICD-10-CM | POA: Diagnosis not present

## 2021-01-25 DIAGNOSIS — L210 Seborrhea capitis: Secondary | ICD-10-CM | POA: Diagnosis not present

## 2021-01-25 DIAGNOSIS — E039 Hypothyroidism, unspecified: Secondary | ICD-10-CM | POA: Diagnosis not present

## 2021-01-25 DIAGNOSIS — R262 Difficulty in walking, not elsewhere classified: Secondary | ICD-10-CM | POA: Diagnosis not present

## 2021-01-25 DIAGNOSIS — M25569 Pain in unspecified knee: Secondary | ICD-10-CM | POA: Diagnosis not present

## 2021-01-25 DIAGNOSIS — R627 Adult failure to thrive: Secondary | ICD-10-CM | POA: Diagnosis not present

## 2021-01-25 DIAGNOSIS — R0902 Hypoxemia: Secondary | ICD-10-CM | POA: Diagnosis not present

## 2021-01-25 DIAGNOSIS — R52 Pain, unspecified: Secondary | ICD-10-CM | POA: Diagnosis not present

## 2021-01-25 DIAGNOSIS — J9601 Acute respiratory failure with hypoxia: Secondary | ICD-10-CM | POA: Diagnosis not present

## 2021-01-25 DIAGNOSIS — K5909 Other constipation: Secondary | ICD-10-CM | POA: Diagnosis not present

## 2021-01-25 DIAGNOSIS — G894 Chronic pain syndrome: Secondary | ICD-10-CM | POA: Diagnosis not present

## 2021-01-25 DIAGNOSIS — R0989 Other specified symptoms and signs involving the circulatory and respiratory systems: Secondary | ICD-10-CM | POA: Diagnosis not present

## 2021-01-25 DIAGNOSIS — S2249XD Multiple fractures of ribs, unspecified side, subsequent encounter for fracture with routine healing: Secondary | ICD-10-CM | POA: Diagnosis not present

## 2021-01-25 DIAGNOSIS — R140 Abdominal distension (gaseous): Secondary | ICD-10-CM | POA: Diagnosis not present

## 2021-01-25 DIAGNOSIS — M6281 Muscle weakness (generalized): Secondary | ICD-10-CM | POA: Diagnosis not present

## 2021-01-25 DIAGNOSIS — R031 Nonspecific low blood-pressure reading: Secondary | ICD-10-CM | POA: Diagnosis not present

## 2021-01-25 DIAGNOSIS — I5023 Acute on chronic systolic (congestive) heart failure: Secondary | ICD-10-CM | POA: Diagnosis not present

## 2021-01-25 DIAGNOSIS — I2510 Atherosclerotic heart disease of native coronary artery without angina pectoris: Secondary | ICD-10-CM | POA: Diagnosis not present

## 2021-01-25 DIAGNOSIS — R410 Disorientation, unspecified: Secondary | ICD-10-CM | POA: Diagnosis not present

## 2021-01-25 DIAGNOSIS — I5032 Chronic diastolic (congestive) heart failure: Secondary | ICD-10-CM | POA: Diagnosis not present

## 2021-01-25 DIAGNOSIS — R1084 Generalized abdominal pain: Secondary | ICD-10-CM | POA: Diagnosis not present

## 2021-01-25 DIAGNOSIS — Z95810 Presence of automatic (implantable) cardiac defibrillator: Secondary | ICD-10-CM | POA: Diagnosis not present

## 2021-01-25 DIAGNOSIS — R0600 Dyspnea, unspecified: Secondary | ICD-10-CM | POA: Diagnosis not present

## 2021-01-25 DIAGNOSIS — J189 Pneumonia, unspecified organism: Secondary | ICD-10-CM | POA: Diagnosis not present

## 2021-01-25 DIAGNOSIS — I5022 Chronic systolic (congestive) heart failure: Secondary | ICD-10-CM | POA: Diagnosis not present

## 2021-01-25 DIAGNOSIS — S12600D Unspecified displaced fracture of seventh cervical vertebra, subsequent encounter for fracture with routine healing: Secondary | ICD-10-CM | POA: Diagnosis not present

## 2021-01-25 DIAGNOSIS — S2241XD Multiple fractures of ribs, right side, subsequent encounter for fracture with routine healing: Secondary | ICD-10-CM | POA: Diagnosis not present

## 2021-01-25 DIAGNOSIS — N3946 Mixed incontinence: Secondary | ICD-10-CM | POA: Diagnosis not present

## 2021-01-25 DIAGNOSIS — L89890 Pressure ulcer of other site, unstageable: Secondary | ICD-10-CM | POA: Diagnosis not present

## 2021-01-25 DIAGNOSIS — F419 Anxiety disorder, unspecified: Secondary | ICD-10-CM | POA: Diagnosis not present

## 2021-01-25 DIAGNOSIS — Z515 Encounter for palliative care: Secondary | ICD-10-CM | POA: Diagnosis not present

## 2021-01-25 DIAGNOSIS — R1312 Dysphagia, oropharyngeal phase: Secondary | ICD-10-CM | POA: Diagnosis not present

## 2021-01-25 DIAGNOSIS — Z743 Need for continuous supervision: Secondary | ICD-10-CM | POA: Diagnosis not present

## 2021-01-25 DIAGNOSIS — I951 Orthostatic hypotension: Secondary | ICD-10-CM | POA: Diagnosis not present

## 2021-01-25 DIAGNOSIS — M25561 Pain in right knee: Secondary | ICD-10-CM | POA: Diagnosis not present

## 2021-01-25 DIAGNOSIS — J90 Pleural effusion, not elsewhere classified: Secondary | ICD-10-CM | POA: Diagnosis not present

## 2021-01-25 DIAGNOSIS — M79604 Pain in right leg: Secondary | ICD-10-CM | POA: Diagnosis not present

## 2021-01-25 DIAGNOSIS — S12400D Unspecified displaced fracture of fifth cervical vertebra, subsequent encounter for fracture with routine healing: Secondary | ICD-10-CM | POA: Diagnosis not present

## 2021-01-25 DIAGNOSIS — S01301A Unspecified open wound of right ear, initial encounter: Secondary | ICD-10-CM | POA: Diagnosis not present

## 2021-01-25 DIAGNOSIS — S01302A Unspecified open wound of left ear, initial encounter: Secondary | ICD-10-CM | POA: Diagnosis not present

## 2021-01-25 DIAGNOSIS — I517 Cardiomegaly: Secondary | ICD-10-CM | POA: Diagnosis not present

## 2021-01-25 DIAGNOSIS — Z9981 Dependence on supplemental oxygen: Secondary | ICD-10-CM | POA: Diagnosis not present

## 2021-01-25 DIAGNOSIS — R279 Unspecified lack of coordination: Secondary | ICD-10-CM | POA: Diagnosis not present

## 2021-01-25 DIAGNOSIS — K5900 Constipation, unspecified: Secondary | ICD-10-CM | POA: Diagnosis not present

## 2021-01-25 DIAGNOSIS — M79671 Pain in right foot: Secondary | ICD-10-CM | POA: Diagnosis not present

## 2021-01-25 DIAGNOSIS — L89620 Pressure ulcer of left heel, unstageable: Secondary | ICD-10-CM | POA: Diagnosis not present

## 2021-01-25 DIAGNOSIS — L89616 Pressure-induced deep tissue damage of right heel: Secondary | ICD-10-CM | POA: Diagnosis not present

## 2021-01-25 DIAGNOSIS — R0602 Shortness of breath: Secondary | ICD-10-CM | POA: Diagnosis not present

## 2021-01-25 DIAGNOSIS — R630 Anorexia: Secondary | ICD-10-CM | POA: Diagnosis not present

## 2021-01-25 DIAGNOSIS — I10 Essential (primary) hypertension: Secondary | ICD-10-CM | POA: Diagnosis not present

## 2021-01-25 DIAGNOSIS — J9611 Chronic respiratory failure with hypoxia: Secondary | ICD-10-CM | POA: Diagnosis not present

## 2021-01-25 DIAGNOSIS — S32501D Unspecified fracture of right pubis, subsequent encounter for fracture with routine healing: Secondary | ICD-10-CM | POA: Diagnosis not present

## 2021-01-25 DIAGNOSIS — R0781 Pleurodynia: Secondary | ICD-10-CM | POA: Diagnosis not present

## 2021-01-25 DIAGNOSIS — M40292 Other kyphosis, cervical region: Secondary | ICD-10-CM | POA: Diagnosis not present

## 2021-01-25 DIAGNOSIS — R55 Syncope and collapse: Secondary | ICD-10-CM | POA: Diagnosis not present

## 2021-01-25 DIAGNOSIS — R5381 Other malaise: Secondary | ICD-10-CM | POA: Diagnosis not present

## 2021-01-25 DIAGNOSIS — L89323 Pressure ulcer of left buttock, stage 3: Secondary | ICD-10-CM | POA: Diagnosis not present

## 2021-01-25 DIAGNOSIS — M79672 Pain in left foot: Secondary | ICD-10-CM | POA: Diagnosis not present

## 2021-01-25 DIAGNOSIS — E440 Moderate protein-calorie malnutrition: Secondary | ICD-10-CM | POA: Diagnosis not present

## 2021-01-25 DIAGNOSIS — S12500D Unspecified displaced fracture of sixth cervical vertebra, subsequent encounter for fracture with routine healing: Secondary | ICD-10-CM | POA: Diagnosis not present

## 2021-01-27 ENCOUNTER — Telehealth: Payer: Self-pay | Admitting: *Deleted

## 2021-01-27 DIAGNOSIS — R0600 Dyspnea, unspecified: Secondary | ICD-10-CM | POA: Diagnosis not present

## 2021-01-27 DIAGNOSIS — S32599D Other specified fracture of unspecified pubis, subsequent encounter for fracture with routine healing: Secondary | ICD-10-CM | POA: Diagnosis not present

## 2021-01-27 DIAGNOSIS — S2249XD Multiple fractures of ribs, unspecified side, subsequent encounter for fracture with routine healing: Secondary | ICD-10-CM | POA: Diagnosis not present

## 2021-01-27 DIAGNOSIS — R0902 Hypoxemia: Secondary | ICD-10-CM | POA: Diagnosis not present

## 2021-01-27 DIAGNOSIS — J90 Pleural effusion, not elsewhere classified: Secondary | ICD-10-CM | POA: Diagnosis not present

## 2021-01-27 DIAGNOSIS — I5023 Acute on chronic systolic (congestive) heart failure: Secondary | ICD-10-CM | POA: Diagnosis not present

## 2021-01-27 DIAGNOSIS — Z9981 Dependence on supplemental oxygen: Secondary | ICD-10-CM | POA: Diagnosis not present

## 2021-01-27 DIAGNOSIS — R0602 Shortness of breath: Secondary | ICD-10-CM | POA: Diagnosis not present

## 2021-01-27 DIAGNOSIS — R0781 Pleurodynia: Secondary | ICD-10-CM | POA: Diagnosis not present

## 2021-01-27 NOTE — Telephone Encounter (Signed)
Transition Care Management Unsuccessful Follow-up Telephone Call  Date of discharge and from where:  01/25/2021 - [facility]  Attempts:  1st Attempt  Reason for unsuccessful TCM follow-up call:  No answer/busy

## 2021-01-28 DIAGNOSIS — I5023 Acute on chronic systolic (congestive) heart failure: Secondary | ICD-10-CM | POA: Diagnosis not present

## 2021-01-28 DIAGNOSIS — S2249XD Multiple fractures of ribs, unspecified side, subsequent encounter for fracture with routine healing: Secondary | ICD-10-CM | POA: Diagnosis not present

## 2021-01-28 DIAGNOSIS — Z9981 Dependence on supplemental oxygen: Secondary | ICD-10-CM | POA: Diagnosis not present

## 2021-01-28 DIAGNOSIS — R0902 Hypoxemia: Secondary | ICD-10-CM | POA: Diagnosis not present

## 2021-01-28 DIAGNOSIS — J90 Pleural effusion, not elsewhere classified: Secondary | ICD-10-CM | POA: Diagnosis not present

## 2021-01-28 DIAGNOSIS — R0781 Pleurodynia: Secondary | ICD-10-CM | POA: Diagnosis not present

## 2021-01-28 DIAGNOSIS — S32599D Other specified fracture of unspecified pubis, subsequent encounter for fracture with routine healing: Secondary | ICD-10-CM | POA: Diagnosis not present

## 2021-01-28 DIAGNOSIS — R0600 Dyspnea, unspecified: Secondary | ICD-10-CM | POA: Diagnosis not present

## 2021-01-28 DIAGNOSIS — R0602 Shortness of breath: Secondary | ICD-10-CM | POA: Diagnosis not present

## 2021-01-28 NOTE — Telephone Encounter (Signed)
Transition Care Management Follow-up Telephone Call  Date of discharge and from where: 01/25/2021 - [facility]  How have you been since you were released from the hospital? "Doing well"  Any questions or concerns? No  Items Reviewed:  Did the pt receive and understand the discharge instructions provided? Yes   Medications obtained and verified? Yes   Other? No   Any new allergies since your discharge? No   Dietary orders reviewed? Yes  Do you have support at home? Yes   Home Care and Equipment/Supplies: Were home health services ordered? not applicable If so, what is the name of the agency? [name]  Has the agency set up a time to come to the patient's home? not applicable Were any new equipment or medical supplies ordered?  No What is the name of the medical supply agency? [name] Were you able to get the supplies/equipment? not applicable Do you have any questions related to the use of the equipment or supplies? No  Functional Questionnaire: (I = Independent and D = Dependent) ADLs: I  Bathing/Dressing- I  Meal Prep- I  Eating- I  Maintaining continence- I  Transferring/Ambulation- I  Managing Meds- I  Follow up appointments reviewed:   PCP Hospital f/u appt confirmed? Yes  Scheduled to see Dr. Juleen China in April for a routine appointment.  [facility] f/u appt confirmed? No  S  Are transportation arrangements needed? No   If their condition worsens, is the pt aware to call PCP or go to the Emergency Dept.? Yes  Was the patient provided with contact information for the PCP's office or ED? Yes  Was to pt encouraged to call back with questions or concerns? Yes

## 2021-01-29 ENCOUNTER — Ambulatory Visit: Payer: Medicare Other | Admitting: Podiatry

## 2021-01-29 DIAGNOSIS — I5023 Acute on chronic systolic (congestive) heart failure: Secondary | ICD-10-CM | POA: Diagnosis not present

## 2021-01-29 DIAGNOSIS — R1084 Generalized abdominal pain: Secondary | ICD-10-CM | POA: Diagnosis not present

## 2021-01-29 DIAGNOSIS — Z9981 Dependence on supplemental oxygen: Secondary | ICD-10-CM | POA: Diagnosis not present

## 2021-01-29 DIAGNOSIS — F419 Anxiety disorder, unspecified: Secondary | ICD-10-CM | POA: Diagnosis not present

## 2021-01-29 DIAGNOSIS — R0602 Shortness of breath: Secondary | ICD-10-CM | POA: Diagnosis not present

## 2021-01-29 DIAGNOSIS — K567 Ileus, unspecified: Secondary | ICD-10-CM | POA: Diagnosis not present

## 2021-01-29 DIAGNOSIS — K5909 Other constipation: Secondary | ICD-10-CM | POA: Diagnosis not present

## 2021-01-29 DIAGNOSIS — R630 Anorexia: Secondary | ICD-10-CM | POA: Diagnosis not present

## 2021-01-29 DIAGNOSIS — R0600 Dyspnea, unspecified: Secondary | ICD-10-CM | POA: Diagnosis not present

## 2021-01-31 DIAGNOSIS — R0600 Dyspnea, unspecified: Secondary | ICD-10-CM | POA: Diagnosis not present

## 2021-01-31 DIAGNOSIS — R630 Anorexia: Secondary | ICD-10-CM | POA: Diagnosis not present

## 2021-01-31 DIAGNOSIS — Z9981 Dependence on supplemental oxygen: Secondary | ICD-10-CM | POA: Diagnosis not present

## 2021-01-31 DIAGNOSIS — K5909 Other constipation: Secondary | ICD-10-CM | POA: Diagnosis not present

## 2021-01-31 DIAGNOSIS — I5023 Acute on chronic systolic (congestive) heart failure: Secondary | ICD-10-CM | POA: Diagnosis not present

## 2021-01-31 DIAGNOSIS — R1084 Generalized abdominal pain: Secondary | ICD-10-CM | POA: Diagnosis not present

## 2021-01-31 DIAGNOSIS — R0602 Shortness of breath: Secondary | ICD-10-CM | POA: Diagnosis not present

## 2021-01-31 DIAGNOSIS — F419 Anxiety disorder, unspecified: Secondary | ICD-10-CM | POA: Diagnosis not present

## 2021-01-31 DIAGNOSIS — K567 Ileus, unspecified: Secondary | ICD-10-CM | POA: Diagnosis not present

## 2021-01-31 DIAGNOSIS — J189 Pneumonia, unspecified organism: Secondary | ICD-10-CM | POA: Diagnosis not present

## 2021-02-03 DIAGNOSIS — J189 Pneumonia, unspecified organism: Secondary | ICD-10-CM | POA: Diagnosis not present

## 2021-02-03 DIAGNOSIS — R630 Anorexia: Secondary | ICD-10-CM | POA: Diagnosis not present

## 2021-02-03 DIAGNOSIS — K567 Ileus, unspecified: Secondary | ICD-10-CM | POA: Diagnosis not present

## 2021-02-03 DIAGNOSIS — Z9981 Dependence on supplemental oxygen: Secondary | ICD-10-CM | POA: Diagnosis not present

## 2021-02-03 DIAGNOSIS — K5909 Other constipation: Secondary | ICD-10-CM | POA: Diagnosis not present

## 2021-02-03 DIAGNOSIS — F419 Anxiety disorder, unspecified: Secondary | ICD-10-CM | POA: Diagnosis not present

## 2021-02-03 DIAGNOSIS — R1084 Generalized abdominal pain: Secondary | ICD-10-CM | POA: Diagnosis not present

## 2021-02-03 DIAGNOSIS — R0602 Shortness of breath: Secondary | ICD-10-CM | POA: Diagnosis not present

## 2021-02-03 DIAGNOSIS — I5023 Acute on chronic systolic (congestive) heart failure: Secondary | ICD-10-CM | POA: Diagnosis not present

## 2021-02-04 ENCOUNTER — Non-Acute Institutional Stay: Payer: Self-pay | Admitting: Hospice

## 2021-02-04 ENCOUNTER — Other Ambulatory Visit: Payer: Self-pay

## 2021-02-04 DIAGNOSIS — Z515 Encounter for palliative care: Secondary | ICD-10-CM

## 2021-02-04 DIAGNOSIS — N3946 Mixed incontinence: Secondary | ICD-10-CM | POA: Diagnosis not present

## 2021-02-04 DIAGNOSIS — L89626 Pressure-induced deep tissue damage of left heel: Secondary | ICD-10-CM | POA: Diagnosis not present

## 2021-02-04 DIAGNOSIS — S2241XD Multiple fractures of ribs, right side, subsequent encounter for fracture with routine healing: Secondary | ICD-10-CM | POA: Diagnosis not present

## 2021-02-04 DIAGNOSIS — R52 Pain, unspecified: Secondary | ICD-10-CM | POA: Diagnosis not present

## 2021-02-04 DIAGNOSIS — S3282XA Multiple fractures of pelvis without disruption of pelvic ring, initial encounter for closed fracture: Secondary | ICD-10-CM | POA: Diagnosis not present

## 2021-02-04 DIAGNOSIS — L89616 Pressure-induced deep tissue damage of right heel: Secondary | ICD-10-CM | POA: Diagnosis not present
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
## 2021-02-10 DIAGNOSIS — R5383 Other fatigue: Secondary | ICD-10-CM | POA: Diagnosis not present

## 2021-02-10 DIAGNOSIS — R140 Abdominal distension (gaseous): Secondary | ICD-10-CM | POA: Diagnosis not present

## 2021-02-10 DIAGNOSIS — R0602 Shortness of breath: Secondary | ICD-10-CM | POA: Diagnosis not present

## 2021-02-10 DIAGNOSIS — K5909 Other constipation: Secondary | ICD-10-CM | POA: Diagnosis not present

## 2021-02-10 DIAGNOSIS — M6281 Muscle weakness (generalized): Secondary | ICD-10-CM | POA: Diagnosis not present

## 2021-02-10 DIAGNOSIS — R627 Adult failure to thrive: Secondary | ICD-10-CM | POA: Diagnosis not present

## 2021-02-13 DIAGNOSIS — Z9981 Dependence on supplemental oxygen: Secondary | ICD-10-CM | POA: Diagnosis not present

## 2021-02-13 DIAGNOSIS — J9611 Chronic respiratory failure with hypoxia: Secondary | ICD-10-CM | POA: Diagnosis not present

## 2021-02-13 DIAGNOSIS — I10 Essential (primary) hypertension: Secondary | ICD-10-CM | POA: Diagnosis not present

## 2021-02-13 DIAGNOSIS — R1312 Dysphagia, oropharyngeal phase: Secondary | ICD-10-CM | POA: Diagnosis not present

## 2021-02-13 DIAGNOSIS — I5032 Chronic diastolic (congestive) heart failure: Secondary | ICD-10-CM | POA: Diagnosis not present

## 2021-02-13 DIAGNOSIS — R031 Nonspecific low blood-pressure reading: Secondary | ICD-10-CM | POA: Diagnosis not present

## 2021-02-13 DIAGNOSIS — R0602 Shortness of breath: Secondary | ICD-10-CM | POA: Diagnosis not present

## 2021-02-13 DIAGNOSIS — J189 Pneumonia, unspecified organism: Secondary | ICD-10-CM | POA: Diagnosis not present

## 2021-02-13 DIAGNOSIS — R627 Adult failure to thrive: Secondary | ICD-10-CM | POA: Diagnosis not present

## 2021-02-14 DIAGNOSIS — Z9981 Dependence on supplemental oxygen: Secondary | ICD-10-CM | POA: Diagnosis not present

## 2021-02-14 DIAGNOSIS — L89626 Pressure-induced deep tissue damage of left heel: Secondary | ICD-10-CM | POA: Diagnosis not present

## 2021-02-14 DIAGNOSIS — M6281 Muscle weakness (generalized): Secondary | ICD-10-CM | POA: Diagnosis not present

## 2021-02-14 DIAGNOSIS — L89890 Pressure ulcer of other site, unstageable: Secondary | ICD-10-CM | POA: Diagnosis not present

## 2021-02-14 DIAGNOSIS — S01302A Unspecified open wound of left ear, initial encounter: Secondary | ICD-10-CM | POA: Diagnosis not present

## 2021-02-14 DIAGNOSIS — J9611 Chronic respiratory failure with hypoxia: Secondary | ICD-10-CM | POA: Diagnosis not present

## 2021-02-14 DIAGNOSIS — R0602 Shortness of breath: Secondary | ICD-10-CM | POA: Diagnosis not present

## 2021-02-14 DIAGNOSIS — L89616 Pressure-induced deep tissue damage of right heel: Secondary | ICD-10-CM | POA: Diagnosis not present

## 2021-02-14 DIAGNOSIS — R627 Adult failure to thrive: Secondary | ICD-10-CM | POA: Diagnosis not present

## 2021-02-14 DIAGNOSIS — S01301A Unspecified open wound of right ear, initial encounter: Secondary | ICD-10-CM | POA: Diagnosis not present

## 2021-02-14 DIAGNOSIS — S3282XA Multiple fractures of pelvis without disruption of pelvic ring, initial encounter for closed fracture: Secondary | ICD-10-CM | POA: Diagnosis not present

## 2021-02-20 DIAGNOSIS — R5381 Other malaise: Secondary | ICD-10-CM | POA: Diagnosis not present

## 2021-02-20 DIAGNOSIS — J189 Pneumonia, unspecified organism: Secondary | ICD-10-CM | POA: Diagnosis not present

## 2021-02-20 DIAGNOSIS — Z9981 Dependence on supplemental oxygen: Secondary | ICD-10-CM | POA: Diagnosis not present

## 2021-02-20 DIAGNOSIS — J9611 Chronic respiratory failure with hypoxia: Secondary | ICD-10-CM | POA: Diagnosis not present

## 2021-02-20 DIAGNOSIS — R0602 Shortness of breath: Secondary | ICD-10-CM | POA: Diagnosis not present

## 2021-02-20 DIAGNOSIS — M6281 Muscle weakness (generalized): Secondary | ICD-10-CM | POA: Diagnosis not present

## 2021-02-25 DIAGNOSIS — S3282XA Multiple fractures of pelvis without disruption of pelvic ring, initial encounter for closed fracture: Secondary | ICD-10-CM | POA: Diagnosis not present

## 2021-02-25 DIAGNOSIS — L89890 Pressure ulcer of other site, unstageable: Secondary | ICD-10-CM | POA: Diagnosis not present

## 2021-02-25 DIAGNOSIS — L89616 Pressure-induced deep tissue damage of right heel: Secondary | ICD-10-CM | POA: Diagnosis not present

## 2021-02-25 DIAGNOSIS — L89626 Pressure-induced deep tissue damage of left heel: Secondary | ICD-10-CM | POA: Diagnosis not present

## 2021-03-04 DIAGNOSIS — S3282XA Multiple fractures of pelvis without disruption of pelvic ring, initial encounter for closed fracture: Secondary | ICD-10-CM | POA: Diagnosis not present

## 2021-03-04 DIAGNOSIS — L89323 Pressure ulcer of left buttock, stage 3: Secondary | ICD-10-CM | POA: Diagnosis not present

## 2021-03-04 DIAGNOSIS — L89626 Pressure-induced deep tissue damage of left heel: Secondary | ICD-10-CM | POA: Diagnosis not present

## 2021-03-04 DIAGNOSIS — N3946 Mixed incontinence: Secondary | ICD-10-CM | POA: Diagnosis not present

## 2021-03-10 ENCOUNTER — Ambulatory Visit: Payer: Medicare Other | Admitting: Internal Medicine

## 2021-03-11 DIAGNOSIS — R262 Difficulty in walking, not elsewhere classified: Secondary | ICD-10-CM | POA: Diagnosis not present

## 2021-03-11 DIAGNOSIS — R5381 Other malaise: Secondary | ICD-10-CM | POA: Diagnosis not present

## 2021-03-11 DIAGNOSIS — M6281 Muscle weakness (generalized): Secondary | ICD-10-CM | POA: Diagnosis not present

## 2021-03-11 DIAGNOSIS — M25569 Pain in unspecified knee: Secondary | ICD-10-CM | POA: Diagnosis not present

## 2021-03-11 DIAGNOSIS — S3282XA Multiple fractures of pelvis without disruption of pelvic ring, initial encounter for closed fracture: Secondary | ICD-10-CM | POA: Diagnosis not present

## 2021-03-11 DIAGNOSIS — N3946 Mixed incontinence: Secondary | ICD-10-CM | POA: Diagnosis not present

## 2021-03-11 DIAGNOSIS — L89323 Pressure ulcer of left buttock, stage 3: Secondary | ICD-10-CM | POA: Diagnosis not present

## 2021-03-11 DIAGNOSIS — L89626 Pressure-induced deep tissue damage of left heel: Secondary | ICD-10-CM | POA: Diagnosis not present

## 2021-03-12 DIAGNOSIS — M79671 Pain in right foot: Secondary | ICD-10-CM | POA: Diagnosis not present

## 2021-03-12 DIAGNOSIS — K567 Ileus, unspecified: Secondary | ICD-10-CM | POA: Diagnosis not present

## 2021-03-12 DIAGNOSIS — L89610 Pressure ulcer of right heel, unstageable: Secondary | ICD-10-CM | POA: Diagnosis not present

## 2021-03-12 DIAGNOSIS — M6281 Muscle weakness (generalized): Secondary | ICD-10-CM | POA: Diagnosis not present

## 2021-03-12 DIAGNOSIS — R5383 Other fatigue: Secondary | ICD-10-CM | POA: Diagnosis not present

## 2021-03-12 DIAGNOSIS — R627 Adult failure to thrive: Secondary | ICD-10-CM | POA: Diagnosis not present

## 2021-03-12 DIAGNOSIS — R5381 Other malaise: Secondary | ICD-10-CM | POA: Diagnosis not present

## 2021-03-12 DIAGNOSIS — K5900 Constipation, unspecified: Secondary | ICD-10-CM | POA: Diagnosis not present

## 2021-03-12 DIAGNOSIS — L89620 Pressure ulcer of left heel, unstageable: Secondary | ICD-10-CM | POA: Diagnosis not present

## 2021-03-13 DIAGNOSIS — I951 Orthostatic hypotension: Secondary | ICD-10-CM | POA: Diagnosis not present

## 2021-03-13 DIAGNOSIS — K5909 Other constipation: Secondary | ICD-10-CM | POA: Diagnosis not present

## 2021-03-13 DIAGNOSIS — J9611 Chronic respiratory failure with hypoxia: Secondary | ICD-10-CM | POA: Diagnosis not present

## 2021-03-13 DIAGNOSIS — M6281 Muscle weakness (generalized): Secondary | ICD-10-CM | POA: Diagnosis not present

## 2021-03-13 DIAGNOSIS — R627 Adult failure to thrive: Secondary | ICD-10-CM | POA: Diagnosis not present

## 2021-03-13 DIAGNOSIS — R5383 Other fatigue: Secondary | ICD-10-CM | POA: Diagnosis not present

## 2021-03-13 DIAGNOSIS — R5381 Other malaise: Secondary | ICD-10-CM | POA: Diagnosis not present

## 2021-03-13 DIAGNOSIS — R55 Syncope and collapse: Secondary | ICD-10-CM | POA: Diagnosis not present

## 2021-03-14 DIAGNOSIS — M6281 Muscle weakness (generalized): Secondary | ICD-10-CM | POA: Diagnosis not present

## 2021-03-14 DIAGNOSIS — R52 Pain, unspecified: Secondary | ICD-10-CM | POA: Diagnosis not present

## 2021-03-14 DIAGNOSIS — R627 Adult failure to thrive: Secondary | ICD-10-CM | POA: Diagnosis not present

## 2021-03-14 DIAGNOSIS — R5381 Other malaise: Secondary | ICD-10-CM | POA: Diagnosis not present

## 2021-03-14 DIAGNOSIS — J9611 Chronic respiratory failure with hypoxia: Secondary | ICD-10-CM | POA: Diagnosis not present

## 2021-03-14 DIAGNOSIS — K567 Ileus, unspecified: Secondary | ICD-10-CM | POA: Diagnosis not present

## 2021-03-18 DIAGNOSIS — N3946 Mixed incontinence: Secondary | ICD-10-CM | POA: Diagnosis not present

## 2021-03-18 DIAGNOSIS — S3282XA Multiple fractures of pelvis without disruption of pelvic ring, initial encounter for closed fracture: Secondary | ICD-10-CM | POA: Diagnosis not present

## 2021-03-18 DIAGNOSIS — L89323 Pressure ulcer of left buttock, stage 3: Secondary | ICD-10-CM | POA: Diagnosis not present

## 2021-03-18 DIAGNOSIS — L89626 Pressure-induced deep tissue damage of left heel: Secondary | ICD-10-CM | POA: Diagnosis not present

## 2021-03-26 DIAGNOSIS — M6281 Muscle weakness (generalized): Secondary | ICD-10-CM | POA: Diagnosis not present

## 2021-03-26 DIAGNOSIS — L89626 Pressure-induced deep tissue damage of left heel: Secondary | ICD-10-CM | POA: Diagnosis not present

## 2021-03-26 DIAGNOSIS — N3946 Mixed incontinence: Secondary | ICD-10-CM | POA: Diagnosis not present

## 2021-03-26 DIAGNOSIS — L89323 Pressure ulcer of left buttock, stage 3: Secondary | ICD-10-CM | POA: Diagnosis not present

## 2021-03-26 DIAGNOSIS — S3282XA Multiple fractures of pelvis without disruption of pelvic ring, initial encounter for closed fracture: Secondary | ICD-10-CM | POA: Diagnosis not present

## 2021-03-26 DIAGNOSIS — R5381 Other malaise: Secondary | ICD-10-CM | POA: Diagnosis not present

## 2021-03-26 DIAGNOSIS — M25569 Pain in unspecified knee: Secondary | ICD-10-CM | POA: Diagnosis not present

## 2021-03-26 DIAGNOSIS — R262 Difficulty in walking, not elsewhere classified: Secondary | ICD-10-CM | POA: Diagnosis not present

## 2021-03-27 DIAGNOSIS — G894 Chronic pain syndrome: Secondary | ICD-10-CM | POA: Diagnosis not present

## 2021-03-27 DIAGNOSIS — M79604 Pain in right leg: Secondary | ICD-10-CM | POA: Diagnosis not present

## 2021-03-27 DIAGNOSIS — R627 Adult failure to thrive: Secondary | ICD-10-CM | POA: Diagnosis not present

## 2021-03-27 DIAGNOSIS — G629 Polyneuropathy, unspecified: Secondary | ICD-10-CM | POA: Diagnosis not present

## 2021-03-27 DIAGNOSIS — R410 Disorientation, unspecified: Secondary | ICD-10-CM | POA: Diagnosis not present

## 2021-03-27 DIAGNOSIS — R451 Restlessness and agitation: Secondary | ICD-10-CM | POA: Diagnosis not present

## 2021-04-01 DIAGNOSIS — R262 Difficulty in walking, not elsewhere classified: Secondary | ICD-10-CM | POA: Diagnosis not present

## 2021-04-01 DIAGNOSIS — M25569 Pain in unspecified knee: Secondary | ICD-10-CM | POA: Diagnosis not present

## 2021-04-01 DIAGNOSIS — M6281 Muscle weakness (generalized): Secondary | ICD-10-CM | POA: Diagnosis not present

## 2021-04-01 DIAGNOSIS — R5381 Other malaise: Secondary | ICD-10-CM | POA: Diagnosis not present

## 2021-04-02 DIAGNOSIS — S3282XA Multiple fractures of pelvis without disruption of pelvic ring, initial encounter for closed fracture: Secondary | ICD-10-CM | POA: Diagnosis not present

## 2021-04-02 DIAGNOSIS — L89323 Pressure ulcer of left buttock, stage 3: Secondary | ICD-10-CM | POA: Diagnosis not present

## 2021-04-02 DIAGNOSIS — L89626 Pressure-induced deep tissue damage of left heel: Secondary | ICD-10-CM | POA: Diagnosis not present

## 2021-04-02 DIAGNOSIS — N3946 Mixed incontinence: Secondary | ICD-10-CM | POA: Diagnosis not present

## 2021-04-03 ENCOUNTER — Non-Acute Institutional Stay: Payer: Medicare Other | Admitting: Hospice

## 2021-04-03 ENCOUNTER — Other Ambulatory Visit: Payer: Self-pay

## 2021-04-03 DIAGNOSIS — M25562 Pain in left knee: Secondary | ICD-10-CM | POA: Diagnosis not present

## 2021-04-03 DIAGNOSIS — R52 Pain, unspecified: Secondary | ICD-10-CM

## 2021-04-03 DIAGNOSIS — Z515 Encounter for palliative care: Secondary | ICD-10-CM

## 2021-04-03 DIAGNOSIS — M25561 Pain in right knee: Secondary | ICD-10-CM | POA: Diagnosis not present

## 2021-04-03 DIAGNOSIS — R627 Adult failure to thrive: Secondary | ICD-10-CM | POA: Diagnosis not present

## 2021-04-03 DIAGNOSIS — Z9981 Dependence on supplemental oxygen: Secondary | ICD-10-CM | POA: Diagnosis not present

## 2021-04-03 DIAGNOSIS — L210 Seborrhea capitis: Secondary | ICD-10-CM | POA: Diagnosis not present

## 2021-04-03 DIAGNOSIS — I5022 Chronic systolic (congestive) heart failure: Secondary | ICD-10-CM | POA: Diagnosis not present

## 2021-04-03 DIAGNOSIS — J9611 Chronic respiratory failure with hypoxia: Secondary | ICD-10-CM | POA: Diagnosis not present

## 2021-04-03 DIAGNOSIS — M79672 Pain in left foot: Secondary | ICD-10-CM | POA: Diagnosis not present

## 2021-04-03 DIAGNOSIS — L89620 Pressure ulcer of left heel, unstageable: Secondary | ICD-10-CM | POA: Diagnosis not present
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
## 2021-04-08 DIAGNOSIS — E039 Hypothyroidism, unspecified: Secondary | ICD-10-CM | POA: Diagnosis not present

## 2021-04-08 DIAGNOSIS — J9611 Chronic respiratory failure with hypoxia: Secondary | ICD-10-CM | POA: Diagnosis not present

## 2021-04-08 DIAGNOSIS — E440 Moderate protein-calorie malnutrition: Secondary | ICD-10-CM | POA: Diagnosis not present

## 2021-04-08 DIAGNOSIS — I5022 Chronic systolic (congestive) heart failure: Secondary | ICD-10-CM | POA: Diagnosis not present

## 2021-04-08 DIAGNOSIS — Z9981 Dependence on supplemental oxygen: Secondary | ICD-10-CM | POA: Diagnosis not present

## 2021-04-08 DIAGNOSIS — R627 Adult failure to thrive: Secondary | ICD-10-CM | POA: Diagnosis not present

## 2021-04-08 DIAGNOSIS — I10 Essential (primary) hypertension: Secondary | ICD-10-CM | POA: Diagnosis not present

## 2021-04-08 DIAGNOSIS — M6281 Muscle weakness (generalized): Secondary | ICD-10-CM | POA: Diagnosis not present

## 2021-04-08 DIAGNOSIS — R262 Difficulty in walking, not elsewhere classified: Secondary | ICD-10-CM | POA: Diagnosis not present

## 2021-04-10 DIAGNOSIS — S12500D Unspecified displaced fracture of sixth cervical vertebra, subsequent encounter for fracture with routine healing: Secondary | ICD-10-CM | POA: Diagnosis not present

## 2021-04-10 DIAGNOSIS — I5022 Chronic systolic (congestive) heart failure: Secondary | ICD-10-CM | POA: Diagnosis not present

## 2021-04-10 DIAGNOSIS — S12400D Unspecified displaced fracture of fifth cervical vertebra, subsequent encounter for fracture with routine healing: Secondary | ICD-10-CM | POA: Diagnosis not present

## 2021-04-10 DIAGNOSIS — K567 Ileus, unspecified: Secondary | ICD-10-CM | POA: Diagnosis not present

## 2021-04-10 DIAGNOSIS — E039 Hypothyroidism, unspecified: Secondary | ICD-10-CM | POA: Diagnosis not present

## 2021-04-10 DIAGNOSIS — S37892D Contusion of other urinary and pelvic organ, subsequent encounter: Secondary | ICD-10-CM | POA: Diagnosis not present

## 2021-04-10 DIAGNOSIS — M25562 Pain in left knee: Secondary | ICD-10-CM | POA: Diagnosis not present

## 2021-04-10 DIAGNOSIS — S2241XD Multiple fractures of ribs, right side, subsequent encounter for fracture with routine healing: Secondary | ICD-10-CM | POA: Diagnosis not present

## 2021-04-10 DIAGNOSIS — Z7982 Long term (current) use of aspirin: Secondary | ICD-10-CM | POA: Diagnosis not present

## 2021-04-10 DIAGNOSIS — S12600D Unspecified displaced fracture of seventh cervical vertebra, subsequent encounter for fracture with routine healing: Secondary | ICD-10-CM | POA: Diagnosis not present

## 2021-04-10 DIAGNOSIS — M6281 Muscle weakness (generalized): Secondary | ICD-10-CM | POA: Diagnosis not present

## 2021-04-10 DIAGNOSIS — E785 Hyperlipidemia, unspecified: Secondary | ICD-10-CM | POA: Diagnosis not present

## 2021-04-10 DIAGNOSIS — L89323 Pressure ulcer of left buttock, stage 3: Secondary | ICD-10-CM | POA: Diagnosis not present

## 2021-04-10 DIAGNOSIS — M25561 Pain in right knee: Secondary | ICD-10-CM | POA: Diagnosis not present

## 2021-04-10 DIAGNOSIS — L89620 Pressure ulcer of left heel, unstageable: Secondary | ICD-10-CM | POA: Diagnosis not present

## 2021-04-10 DIAGNOSIS — I2510 Atherosclerotic heart disease of native coronary artery without angina pectoris: Secondary | ICD-10-CM | POA: Diagnosis not present

## 2021-04-10 DIAGNOSIS — I110 Hypertensive heart disease with heart failure: Secondary | ICD-10-CM | POA: Diagnosis not present

## 2021-04-10 DIAGNOSIS — I255 Ischemic cardiomyopathy: Secondary | ICD-10-CM | POA: Diagnosis not present

## 2021-04-10 DIAGNOSIS — S3282XD Multiple fractures of pelvis without disruption of pelvic ring, subsequent encounter for fracture with routine healing: Secondary | ICD-10-CM | POA: Diagnosis not present

## 2021-04-10 DIAGNOSIS — S32501D Unspecified fracture of right pubis, subsequent encounter for fracture with routine healing: Secondary | ICD-10-CM | POA: Diagnosis not present

## 2021-04-10 DIAGNOSIS — J9601 Acute respiratory failure with hypoxia: Secondary | ICD-10-CM | POA: Diagnosis not present

## 2021-04-10 DIAGNOSIS — Z9981 Dependence on supplemental oxygen: Secondary | ICD-10-CM | POA: Diagnosis not present

## 2021-04-10 DIAGNOSIS — D62 Acute posthemorrhagic anemia: Secondary | ICD-10-CM | POA: Diagnosis not present

## 2021-04-10 DIAGNOSIS — N3946 Mixed incontinence: Secondary | ICD-10-CM | POA: Diagnosis not present

## 2021-04-11 DIAGNOSIS — L89620 Pressure ulcer of left heel, unstageable: Secondary | ICD-10-CM | POA: Diagnosis not present

## 2021-04-11 DIAGNOSIS — S32501D Unspecified fracture of right pubis, subsequent encounter for fracture with routine healing: Secondary | ICD-10-CM | POA: Diagnosis not present

## 2021-04-11 DIAGNOSIS — S37892D Contusion of other urinary and pelvic organ, subsequent encounter: Secondary | ICD-10-CM | POA: Diagnosis not present

## 2021-04-11 DIAGNOSIS — D62 Acute posthemorrhagic anemia: Secondary | ICD-10-CM | POA: Diagnosis not present

## 2021-04-11 DIAGNOSIS — S3282XD Multiple fractures of pelvis without disruption of pelvic ring, subsequent encounter for fracture with routine healing: Secondary | ICD-10-CM | POA: Diagnosis not present

## 2021-04-11 DIAGNOSIS — L89323 Pressure ulcer of left buttock, stage 3: Secondary | ICD-10-CM | POA: Diagnosis not present

## 2021-04-13 ENCOUNTER — Inpatient Hospital Stay (HOSPITAL_COMMUNITY)
Admission: EM | Admit: 2021-04-13 | Discharge: 2021-04-21 | DRG: 871 | Disposition: A | Discharge status: Home Health | Payer: Medicare Other | Attending: Internal Medicine | Admitting: Internal Medicine

## 2021-04-13 ENCOUNTER — Emergency Department (HOSPITAL_COMMUNITY): Payer: Medicare Other

## 2021-04-13 ENCOUNTER — Encounter (HOSPITAL_COMMUNITY): Payer: Self-pay

## 2021-04-13 ENCOUNTER — Other Ambulatory Visit: Payer: Self-pay

## 2021-04-13 DIAGNOSIS — I959 Hypotension, unspecified: Secondary | ICD-10-CM | POA: Diagnosis not present

## 2021-04-13 DIAGNOSIS — Z515 Encounter for palliative care: Secondary | ICD-10-CM | POA: Diagnosis not present

## 2021-04-13 DIAGNOSIS — E876 Hypokalemia: Secondary | ICD-10-CM | POA: Diagnosis not present

## 2021-04-13 DIAGNOSIS — L89620 Pressure ulcer of left heel, unstageable: Secondary | ICD-10-CM | POA: Diagnosis not present

## 2021-04-13 DIAGNOSIS — I5022 Chronic systolic (congestive) heart failure: Secondary | ICD-10-CM | POA: Diagnosis not present

## 2021-04-13 DIAGNOSIS — F0390 Unspecified dementia without behavioral disturbance: Secondary | ICD-10-CM | POA: Diagnosis present

## 2021-04-13 DIAGNOSIS — R6520 Severe sepsis without septic shock: Secondary | ICD-10-CM | POA: Diagnosis not present

## 2021-04-13 DIAGNOSIS — Z91048 Other nonmedicinal substance allergy status: Secondary | ICD-10-CM

## 2021-04-13 DIAGNOSIS — Z6821 Body mass index (BMI) 21.0-21.9, adult: Secondary | ICD-10-CM | POA: Diagnosis not present

## 2021-04-13 DIAGNOSIS — M86272 Subacute osteomyelitis, left ankle and foot: Secondary | ICD-10-CM | POA: Diagnosis not present

## 2021-04-13 DIAGNOSIS — R531 Weakness: Secondary | ICD-10-CM | POA: Diagnosis not present

## 2021-04-13 DIAGNOSIS — Z993 Dependence on wheelchair: Secondary | ICD-10-CM

## 2021-04-13 DIAGNOSIS — Z79890 Hormone replacement therapy (postmenopausal): Secondary | ICD-10-CM

## 2021-04-13 DIAGNOSIS — I482 Chronic atrial fibrillation, unspecified: Secondary | ICD-10-CM | POA: Diagnosis present

## 2021-04-13 DIAGNOSIS — R5381 Other malaise: Secondary | ICD-10-CM | POA: Diagnosis present

## 2021-04-13 DIAGNOSIS — R197 Diarrhea, unspecified: Secondary | ICD-10-CM | POA: Diagnosis not present

## 2021-04-13 DIAGNOSIS — I10 Essential (primary) hypertension: Secondary | ICD-10-CM | POA: Diagnosis not present

## 2021-04-13 DIAGNOSIS — Z95810 Presence of automatic (implantable) cardiac defibrillator: Secondary | ICD-10-CM

## 2021-04-13 DIAGNOSIS — L89152 Pressure ulcer of sacral region, stage 2: Secondary | ICD-10-CM | POA: Diagnosis present

## 2021-04-13 DIAGNOSIS — E871 Hypo-osmolality and hyponatremia: Secondary | ICD-10-CM | POA: Diagnosis present

## 2021-04-13 DIAGNOSIS — N390 Urinary tract infection, site not specified: Secondary | ICD-10-CM | POA: Diagnosis not present

## 2021-04-13 DIAGNOSIS — R001 Bradycardia, unspecified: Secondary | ICD-10-CM | POA: Diagnosis not present

## 2021-04-13 DIAGNOSIS — R509 Fever, unspecified: Secondary | ICD-10-CM | POA: Diagnosis not present

## 2021-04-13 DIAGNOSIS — Z9981 Dependence on supplemental oxygen: Secondary | ICD-10-CM

## 2021-04-13 DIAGNOSIS — Z20822 Contact with and (suspected) exposure to covid-19: Secondary | ICD-10-CM | POA: Diagnosis present

## 2021-04-13 DIAGNOSIS — R000 Tachycardia, unspecified: Secondary | ICD-10-CM | POA: Diagnosis not present

## 2021-04-13 DIAGNOSIS — Z7189 Other specified counseling: Secondary | ICD-10-CM | POA: Diagnosis not present

## 2021-04-13 DIAGNOSIS — L89613 Pressure ulcer of right heel, stage 3: Secondary | ICD-10-CM | POA: Diagnosis present

## 2021-04-13 DIAGNOSIS — Z872 Personal history of diseases of the skin and subcutaneous tissue: Secondary | ICD-10-CM | POA: Diagnosis not present

## 2021-04-13 DIAGNOSIS — E039 Hypothyroidism, unspecified: Secondary | ICD-10-CM | POA: Diagnosis present

## 2021-04-13 DIAGNOSIS — E441 Mild protein-calorie malnutrition: Secondary | ICD-10-CM | POA: Diagnosis present

## 2021-04-13 DIAGNOSIS — Z955 Presence of coronary angioplasty implant and graft: Secondary | ICD-10-CM

## 2021-04-13 DIAGNOSIS — I255 Ischemic cardiomyopathy: Secondary | ICD-10-CM | POA: Diagnosis present

## 2021-04-13 DIAGNOSIS — R6521 Severe sepsis with septic shock: Secondary | ICD-10-CM | POA: Diagnosis present

## 2021-04-13 DIAGNOSIS — R627 Adult failure to thrive: Secondary | ICD-10-CM

## 2021-04-13 DIAGNOSIS — R0902 Hypoxemia: Secondary | ICD-10-CM | POA: Diagnosis not present

## 2021-04-13 DIAGNOSIS — K521 Toxic gastroenteritis and colitis: Secondary | ICD-10-CM | POA: Diagnosis present

## 2021-04-13 DIAGNOSIS — E872 Acidosis: Secondary | ICD-10-CM | POA: Diagnosis present

## 2021-04-13 DIAGNOSIS — E785 Hyperlipidemia, unspecified: Secondary | ICD-10-CM | POA: Diagnosis present

## 2021-04-13 DIAGNOSIS — R262 Difficulty in walking, not elsewhere classified: Secondary | ICD-10-CM | POA: Diagnosis present

## 2021-04-13 DIAGNOSIS — I110 Hypertensive heart disease with heart failure: Secondary | ICD-10-CM | POA: Diagnosis present

## 2021-04-13 DIAGNOSIS — K219 Gastro-esophageal reflux disease without esophagitis: Secondary | ICD-10-CM | POA: Diagnosis present

## 2021-04-13 DIAGNOSIS — L89892 Pressure ulcer of other site, stage 2: Secondary | ICD-10-CM | POA: Diagnosis present

## 2021-04-13 DIAGNOSIS — I5020 Unspecified systolic (congestive) heart failure: Secondary | ICD-10-CM | POA: Diagnosis not present

## 2021-04-13 DIAGNOSIS — Z66 Do not resuscitate: Secondary | ICD-10-CM | POA: Diagnosis not present

## 2021-04-13 DIAGNOSIS — R0602 Shortness of breath: Secondary | ICD-10-CM | POA: Diagnosis not present

## 2021-04-13 DIAGNOSIS — I509 Heart failure, unspecified: Secondary | ICD-10-CM | POA: Diagnosis not present

## 2021-04-13 DIAGNOSIS — Z8249 Family history of ischemic heart disease and other diseases of the circulatory system: Secondary | ICD-10-CM

## 2021-04-13 DIAGNOSIS — L8990 Pressure ulcer of unspecified site, unspecified stage: Secondary | ICD-10-CM | POA: Diagnosis present

## 2021-04-13 DIAGNOSIS — I2510 Atherosclerotic heart disease of native coronary artery without angina pectoris: Secondary | ICD-10-CM | POA: Diagnosis present

## 2021-04-13 DIAGNOSIS — R7989 Other specified abnormal findings of blood chemistry: Secondary | ICD-10-CM | POA: Diagnosis present

## 2021-04-13 DIAGNOSIS — Z79899 Other long term (current) drug therapy: Secondary | ICD-10-CM

## 2021-04-13 DIAGNOSIS — Z7982 Long term (current) use of aspirin: Secondary | ICD-10-CM

## 2021-04-13 DIAGNOSIS — N3000 Acute cystitis without hematuria: Secondary | ICD-10-CM | POA: Diagnosis not present

## 2021-04-13 DIAGNOSIS — Z981 Arthrodesis status: Secondary | ICD-10-CM

## 2021-04-13 DIAGNOSIS — M869 Osteomyelitis, unspecified: Secondary | ICD-10-CM

## 2021-04-13 DIAGNOSIS — Z85828 Personal history of other malignant neoplasm of skin: Secondary | ICD-10-CM

## 2021-04-13 DIAGNOSIS — A419 Sepsis, unspecified organism: Secondary | ICD-10-CM | POA: Diagnosis present

## 2021-04-13 DIAGNOSIS — M19072 Primary osteoarthritis, left ankle and foot: Secondary | ICD-10-CM | POA: Diagnosis not present

## 2021-04-13 LAB — CBC WITH DIFFERENTIAL/PLATELET
Abs Immature Granulocytes: 0.08 10*3/uL — ABNORMAL HIGH (ref 0.00–0.07)
Basophils Absolute: 0 10*3/uL (ref 0.0–0.1)
Basophils Relative: 0 %
Eosinophils Absolute: 0 10*3/uL (ref 0.0–0.5)
Eosinophils Relative: 0 %
HCT: 33.1 % — ABNORMAL LOW (ref 39.0–52.0)
Hemoglobin: 10.3 g/dL — ABNORMAL LOW (ref 13.0–17.0)
Immature Granulocytes: 0 %
Lymphocytes Relative: 3 %
Lymphs Abs: 0.5 10*3/uL — ABNORMAL LOW (ref 0.7–4.0)
MCH: 28.1 pg (ref 26.0–34.0)
MCHC: 31.1 g/dL (ref 30.0–36.0)
MCV: 90.4 fL (ref 80.0–100.0)
Monocytes Absolute: 1.5 10*3/uL — ABNORMAL HIGH (ref 0.1–1.0)
Monocytes Relative: 7 %
Neutro Abs: 19.9 10*3/uL — ABNORMAL HIGH (ref 1.7–7.7)
Neutrophils Relative %: 90 %
Platelets: 267 10*3/uL (ref 150–400)
RBC: 3.66 MIL/uL — ABNORMAL LOW (ref 4.22–5.81)
RDW: 16.4 % — ABNORMAL HIGH (ref 11.5–15.5)
WBC: 22 10*3/uL — ABNORMAL HIGH (ref 4.0–10.5)
nRBC: 0 % (ref 0.0–0.2)

## 2021-04-13 LAB — COMPREHENSIVE METABOLIC PANEL
ALT: 16 U/L (ref 0–44)
AST: 19 U/L (ref 15–41)
Albumin: 2.6 g/dL — ABNORMAL LOW (ref 3.5–5.0)
Alkaline Phosphatase: 72 U/L (ref 38–126)
Anion gap: 8 (ref 5–15)
BUN: 32 mg/dL — ABNORMAL HIGH (ref 8–23)
CO2: 27 mmol/L (ref 22–32)
Calcium: 9.9 mg/dL (ref 8.9–10.3)
Chloride: 98 mmol/L (ref 98–111)
Creatinine, Ser: 1.23 mg/dL (ref 0.61–1.24)
GFR, Estimated: 58 mL/min — ABNORMAL LOW (ref >60)
Glucose, Bld: 110 mg/dL — ABNORMAL HIGH (ref 70–99)
Potassium: 3.7 mmol/L (ref 3.5–5.1)
Sodium: 133 mmol/L — ABNORMAL LOW (ref 135–145)
Total Bilirubin: 0.6 mg/dL (ref 0.3–1.2)
Total Protein: 5.3 g/dL — ABNORMAL LOW (ref 6.5–8.1)

## 2021-04-13 LAB — PROTIME-INR
INR: 1.1 (ref 0.8–1.2)
Prothrombin Time: 14 seconds (ref 11.4–15.2)

## 2021-04-13 LAB — LACTIC ACID, PLASMA
Lactic Acid, Venous: 2.1 mmol/L (ref 0.5–1.9)
Lactic Acid, Venous: 3 mmol/L (ref 0.5–1.9)
Lactic Acid, Venous: 2.1 mmol/L (ref 0.5–1.9)
Lactic Acid, Venous: 2 mmol/L (ref 0.5–1.9)
Lactic Acid, Venous: 1.6 mmol/L (ref 0.5–1.9)

## 2021-04-13 LAB — URINALYSIS, ROUTINE W REFLEX MICROSCOPIC
Bilirubin Urine: NEGATIVE
Glucose, UA: NEGATIVE mg/dL
Ketones, ur: NEGATIVE mg/dL
Leukocytes,Ua: NEGATIVE
Nitrite: POSITIVE — AB
Protein, ur: NEGATIVE mg/dL
Specific Gravity, Urine: 1.02 (ref 1.005–1.030)
pH: 5 (ref 5.0–8.0)

## 2021-04-13 LAB — APTT: aPTT: 27 seconds (ref 24–36)

## 2021-04-13 LAB — RESP PANEL BY RT-PCR (FLU A&B, COVID) ARPGX2
Influenza A by PCR: NEGATIVE
Influenza B by PCR: NEGATIVE
SARS Coronavirus 2 by RT PCR: NEGATIVE

## 2021-04-13 LAB — MRSA PCR SCREENING: MRSA by PCR: POSITIVE — AB

## 2021-04-13 MED ORDER — SODIUM CHLORIDE 0.9 % IV SOLN
250.0000 mL | INTRAVENOUS | Status: DC
  Administered 2021-04-13: 250 mL via INTRAVENOUS

## 2021-04-13 MED ORDER — CHLORHEXIDINE GLUCONATE CLOTH 2 % EX PADS
6.0000 | MEDICATED_PAD | Freq: Every day | CUTANEOUS | Status: DC
  Administered 2021-04-13 – 2021-04-15 (×3): 6 via TOPICAL

## 2021-04-13 MED ORDER — NOREPINEPHRINE 4 MG/250ML-% IV SOLN
2.0000 ug/min | INTRAVENOUS | Status: DC
  Administered 2021-04-13: 2 ug/min via INTRAVENOUS
  Administered 2021-04-14: 3 ug/min via INTRAVENOUS
  Filled 2021-04-13 (×2): qty 250

## 2021-04-13 MED ORDER — LACTATED RINGERS IV BOLUS (SEPSIS)
1000.0000 mL | Freq: Once | INTRAVENOUS | Status: AC
  Administered 2021-04-13: 1000 mL via INTRAVENOUS

## 2021-04-13 MED ORDER — SODIUM CHLORIDE 0.9 % IV SOLN
1.0000 g | INTRAVENOUS | Status: DC
  Administered 2021-04-13: 1 g via INTRAVENOUS
  Filled 2021-04-13: qty 10

## 2021-04-13 MED ORDER — LACTATED RINGERS IV BOLUS
1000.0000 mL | Freq: Once | INTRAVENOUS | Status: AC
  Administered 2021-04-13: 1000 mL via INTRAVENOUS

## 2021-04-13 MED ORDER — HEPARIN SODIUM (PORCINE) 5000 UNIT/ML IJ SOLN
5000.0000 [IU] | Freq: Three times a day (TID) | INTRAMUSCULAR | Status: DC
  Administered 2021-04-13 – 2021-04-21 (×23): 5000 [IU] via SUBCUTANEOUS
  Filled 2021-04-13 (×22): qty 1

## 2021-04-13 MED ORDER — DOCUSATE SODIUM 100 MG PO CAPS: 100.0000 mg | ORAL_CAPSULE | Freq: Two times a day (BID) | ORAL | Status: DC | PRN

## 2021-04-13 MED ORDER — SODIUM CHLORIDE 0.9 % IV SOLN
2.0000 g | INTRAVENOUS | Status: AC
  Administered 2021-04-14 – 2021-04-19 (×6): 2 g via INTRAVENOUS
  Filled 2021-04-13: qty 2
  Filled 2021-04-13: qty 20
  Filled 2021-04-13: qty 2
  Filled 2021-04-13: qty 20
  Filled 2021-04-13: qty 2
  Filled 2021-04-13: qty 20

## 2021-04-13 MED ORDER — ATORVASTATIN CALCIUM 40 MG PO TABS
40.0000 mg | ORAL_TABLET | Freq: Every day | ORAL | Status: DC
  Administered 2021-04-13 – 2021-04-20 (×8): 40 mg via ORAL
  Filled 2021-04-13 (×8): qty 1

## 2021-04-13 MED ORDER — LACTATED RINGERS IV SOLN: INTRAVENOUS | Status: DC

## 2021-04-13 MED ORDER — SODIUM CHLORIDE 0.9% FLUSH
3.0000 mL | Freq: Two times a day (BID) | INTRAVENOUS | Status: DC
  Administered 2021-04-14 – 2021-04-21 (×12): 3 mL via INTRAVENOUS

## 2021-04-13 MED ORDER — POLYETHYLENE GLYCOL 3350 17 G PO PACK: 17.0000 g | PACK | Freq: Every day | ORAL | Status: DC | PRN

## 2021-04-13 MED ORDER — SODIUM CHLORIDE 0.9 % IV SOLN: 1.0000 g | INTRAVENOUS | Status: DC

## 2021-04-13 MED ORDER — ACETAMINOPHEN 650 MG RE SUPP: 650.0000 mg | Freq: Four times a day (QID) | RECTAL | Status: DC | PRN

## 2021-04-13 MED ORDER — TRAMADOL HCL 50 MG PO TABS
50.0000 mg | ORAL_TABLET | Freq: Four times a day (QID) | ORAL | Status: DC | PRN
  Administered 2021-04-17 – 2021-04-21 (×5): 50 mg via ORAL
  Filled 2021-04-13 (×5): qty 1

## 2021-04-13 MED ORDER — LEVOTHYROXINE SODIUM 50 MCG PO TABS
50.0000 ug | ORAL_TABLET | Freq: Every day | ORAL | Status: DC
  Administered 2021-04-14 – 2021-04-21 (×8): 50 ug via ORAL
  Filled 2021-04-13 (×8): qty 1

## 2021-04-13 MED ORDER — MUPIROCIN 2 % EX OINT
1.0000 "application " | TOPICAL_OINTMENT | Freq: Two times a day (BID) | CUTANEOUS | Status: AC
  Administered 2021-04-13 – 2021-04-18 (×10): 1 via NASAL
  Filled 2021-04-13 (×2): qty 22

## 2021-04-13 MED ORDER — ASPIRIN EC 81 MG PO TBEC
81.0000 mg | DELAYED_RELEASE_TABLET | Freq: Every day | ORAL | Status: DC
  Administered 2021-04-13 – 2021-04-21 (×9): 81 mg via ORAL
  Filled 2021-04-13 (×9): qty 1

## 2021-04-13 MED ORDER — CHLORHEXIDINE GLUCONATE CLOTH 2 % EX PADS
6.0000 | MEDICATED_PAD | Freq: Every day | CUTANEOUS | Status: AC
  Administered 2021-04-14 – 2021-04-18 (×5): 6 via TOPICAL

## 2021-04-13 MED ORDER — LACTATED RINGERS IV BOLUS (SEPSIS)
250.0000 mL | Freq: Once | INTRAVENOUS | Status: AC
  Administered 2021-04-13: 250 mL via INTRAVENOUS

## 2021-04-13 MED ORDER — ACETAMINOPHEN 325 MG PO TABS
650.0000 mg | ORAL_TABLET | Freq: Four times a day (QID) | ORAL | Status: DC | PRN
  Administered 2021-04-16 – 2021-04-21 (×2): 650 mg via ORAL
  Filled 2021-04-13 (×2): qty 2

## 2021-04-13 MED ORDER — SODIUM CHLORIDE 0.9 % IV SOLN
1.0000 g | Freq: Once | INTRAVENOUS | Status: AC
  Administered 2021-04-13: 1 g via INTRAVENOUS
  Filled 2021-04-13: qty 10

## 2021-04-13 NOTE — ED Notes (Signed)
Critical Lab Value: Lactic Acid: 2.1. Notified Provider
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
## 2021-04-13 NOTE — Progress Notes (Addendum)
Notified provider of need to order repeat lactic [email or web address] secure chat

## 2021-04-13 NOTE — ED Triage Notes (Signed)
EMS reports from home, possible UTI with hypotension, SATs 86 on 4 ltrs-24/7, put on 6 ltrs went to 91. Pt has had burning sensation and dark colored urine x 5 days with low grade fever. Pt taking Tylenol and ABX. Pt PCP advised ED assessment. Pt has ulceration to left heel.   BP 104/40 HR 88 RR 16 Sp02 91 @ 6ltrs CBG 125  18ga L forearm 547ml NS enroute
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
## 2021-04-13 NOTE — Progress Notes (Signed)
elink following sepsis 

## 2021-04-14 ENCOUNTER — Inpatient Hospital Stay (HOSPITAL_COMMUNITY): Payer: Medicare Other

## 2021-04-14 DIAGNOSIS — N3000 Acute cystitis without hematuria: Secondary | ICD-10-CM | POA: Diagnosis not present

## 2021-04-14 DIAGNOSIS — R627 Adult failure to thrive: Secondary | ICD-10-CM

## 2021-04-14 DIAGNOSIS — F0390 Unspecified dementia without behavioral disturbance: Secondary | ICD-10-CM | POA: Diagnosis not present

## 2021-04-14 DIAGNOSIS — Z66 Do not resuscitate: Secondary | ICD-10-CM

## 2021-04-14 DIAGNOSIS — E871 Hypo-osmolality and hyponatremia: Secondary | ICD-10-CM

## 2021-04-14 DIAGNOSIS — L8990 Pressure ulcer of unspecified site, unspecified stage: Secondary | ICD-10-CM | POA: Diagnosis present

## 2021-04-14 DIAGNOSIS — A419 Sepsis, unspecified organism: Secondary | ICD-10-CM | POA: Diagnosis not present

## 2021-04-14 LAB — BASIC METABOLIC PANEL
Anion gap: 5 (ref 5–15)
BUN: 27 mg/dL — ABNORMAL HIGH (ref 8–23)
CO2: 28 mmol/L (ref 22–32)
Calcium: 9.7 mg/dL (ref 8.9–10.3)
Chloride: 100 mmol/L (ref 98–111)
Creatinine, Ser: 0.77 mg/dL (ref 0.61–1.24)
GFR, Estimated: >60 mL/min (ref >60)
Glucose, Bld: 93 mg/dL (ref 70–99)
Potassium: 3.6 mmol/L (ref 3.5–5.1)
Sodium: 133 mmol/L — ABNORMAL LOW (ref 135–145)

## 2021-04-14 LAB — MAGNESIUM: Magnesium: 1.7 mg/dL (ref 1.7–2.4)

## 2021-04-14 LAB — URINE CULTURE: Culture: NO GROWTH

## 2021-04-14 LAB — CORTISOL-AM, BLOOD: Cortisol - AM: 22.8 ug/dL — ABNORMAL HIGH (ref 6.7–22.6)

## 2021-04-14 LAB — PHOSPHORUS: Phosphorus: 2.1 mg/dL — ABNORMAL LOW (ref 2.5–4.6)

## 2021-04-14 LAB — CBC
HCT: 29.6 % — ABNORMAL LOW (ref 39.0–52.0)
Hemoglobin: 9.1 g/dL — ABNORMAL LOW (ref 13.0–17.0)
MCH: 28.3 pg (ref 26.0–34.0)
MCHC: 30.7 g/dL (ref 30.0–36.0)
MCV: 91.9 fL (ref 80.0–100.0)
Platelets: 264 10*3/uL (ref 150–400)
RBC: 3.22 MIL/uL — ABNORMAL LOW (ref 4.22–5.81)
RDW: 16.7 % — ABNORMAL HIGH (ref 11.5–15.5)
WBC: 22.6 10*3/uL — ABNORMAL HIGH (ref 4.0–10.5)
nRBC: 0 % (ref 0.0–0.2)

## 2021-04-14 LAB — PROTIME-INR
INR: 1.2 (ref 0.8–1.2)
Prothrombin Time: 14.7 seconds (ref 11.4–15.2)

## 2021-04-14 LAB — PROCALCITONIN: Procalcitonin: 30.96 ng/mL

## 2021-04-14 MED ORDER — MAGNESIUM SULFATE 2 GM/50ML IV SOLN
2.0000 g | Freq: Once | INTRAVENOUS | Status: AC
  Administered 2021-04-14: 2 g via INTRAVENOUS
  Filled 2021-04-14: qty 50

## 2021-04-14 MED ORDER — DEXTROSE 5 % IV SOLN
15.0000 mmol | Freq: Once | INTRAVENOUS | Status: AC
Start: 2021-04-14 — End: 2021-04-14
  Administered 2021-04-14: 15 mmol via INTRAVENOUS
  Filled 2021-04-14: qty 5

## 2021-04-14 NOTE — Progress Notes (Addendum)
NAME:  Corey Jordan, MRN:  [id number], DOB:  [birth year], LOS: 1 ADMISSION DATE:  04/13/2021, CONSULTATION DATE: 04/13/2021 REFERRING MD: Dr. Neysa Bonito, CHIEF COMPLAINT: Hypotension  History of Present Illness:  87 year old Bed/chair bound male admitted 5/22 w/ sepsis/septic shock w/ working dx of  UT source.   FTT w/ sacral and heal pressure ulcers and DNR status (POA)  Past Medical History:  Diagnosis Date  . Anxiety & depression   . Atrial fibrillation ([place])   . Cardiac defibrillator in situ: s/p MDT Maximo VR single lead ICD - 07/06/2006   . 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   . Degenerative joint disease   . H/O hiatal hernia, GERD   . History of orthostatic hypotension   . Hyperlipidemia, HTN, Hypothyroidism    . Ischemic cardiomyopathy:   EF 40% 12/2008 V gram;  Echocardiogram 11/08/11: EF 30-35%, inferoseptal and anteroseptal, apical septal/lateral/anterior/inferior and true apical akinesis, no LV thrombus, grade 1 diastolic dysfunction.     . Lumbar disc disease: s/p L5-S1 fusion 12/11/2009  09/19/2011  . NSVT (nonsustained ventricular tachycardia) ([place]), PVCs, PACs.  06/23/2019  . Skin cancer: remote  DNR status (POA) BED Bound Sacral pressure ulcer (POA) Failure to thrive (POA)    Significant Hospital Events: Including procedures, antibiotic start and stop dates in addition to other pertinent events   . 5/22 admitted w/ working dx of septic shock UT source. Cultures sent. Still hypotensive after volume. Started on peripheral pressors. abx Hypotension. Ceftriaxone started. MRSA screen pos.  . 5/23 lactate cleared. Still on pressors. No sig change iin wbc   Interim History /  Subjective:  No distress. Weaning pressors   Objective   Blood pressure (Abnormal) 136/52, pulse 74, temperature 98.7 F (37.1 C), temperature source Axillary, resp. rate 16, height 5\' 7"  (1.702 m), weight 60.6 kg, SpO2 97 %.        Intake/Output Summary (Last 24 hours) at 04/14/2021 0800 Last data filed at 04/14/2021 0422 Gross per 24 hour  Intake 1345.3 ml  Output 850 ml  Net 495.3 ml   Filed Weights   04/13/21 1800 04/14/21 0500  Weight: 58.2 kg 60.6 kg    Examination: General this is an 87 year old white male he is lying in bed he is currently in no acute distress although he does appear quite chronically ill, deconditioned, and debilitated HEENT normocephalic atraumatic some temporal wasting appreciated mucous membranes are moist sclera nonicteric Pulmonary: Clear to auscultation not currently on oxygen no accessory use Cardiac: Regular rate and rhythm without audible murmur rub or gallop Abdomen: Soft not tender no organomegaly GU: External drainage catheter device in place clear yellow urine today Neuro: Awake oriented generalized weakness Extremities: Warm dry scattered areas of ecchymosis, sacral wound is dressed, bilateral ankle pressure ulcers appreciated  Labs/imaging that I havepersonally reviewed  (right click and "Reselect all SmartList Selections" daily)  See below  Resolved Hospital Problem list     Assessment & Plan:   Septic shock, urinary tract source Still awaiting cultures Pressor requirements improving, however white cell count has not really improved much and procalcitonin's quite high, I wonder if he has a resistant organism we will have to watch his cultures  closely Plan Continue to wean norepinephrine for systolic blood pressure greater than 100 Continue maintenance IV fluids Day #2 ceftriaxone, awaiting culture data to come back low threshold to widen antibiotics for resistant organism Per primary Per primary holding his Altace and Coreg while  he is hypotensive  History of HFrEF, coronary artery disease, with remote DES, chronic A. fib, hypertension, and hyperlipidemia. Has PPM No current evidence of decompensated heart failure Plan Continuing telemetry monitoring Holding antihypertensives and Coreg as mentioned above Also holding spironolactone and lasix  Okay to continue Lipitor and aspirin  Mild azotemia->improving Plan Cont IVFs Avoid hypotension  Fluid and electrolyte imbalance: Chronic Hyponatremia (was present 2 mo ago) and hypophosphatemia Plan Getting KPO4 currently   Hypothyroidism  Plan Cont replacement   Pressure ulcers involving sacrum, and both heels (present on arrival) Plan Maximize nutrition Pressure relief interventions Wound ostomy nurse consultation  Failure to thrive as evidenced by protein calorie malnutrition, pressure ulceration, and severe deconditioning all (POA) also DNR status (POA) Plan Cont to treat as above but focusing on ensuring we don't do more to him than for him.  No invasive interventions    Best practice (right click and "Reselect all SmartList Selections" daily)  Diet:  Oral Pain/Anxiety/Delirium protocol (if indicated): No VAP protocol (if indicated): Not indicated DVT prophylaxis: Subcutaneous Heparin GI prophylaxis: [name] Glucose control:  SSI No Central venous access:  [name] Arterial line:  [name] Foley:  [name] Mobility:  OOB  PT consulted: Yes Last date of multidisciplinary goals of care discussion [pending] Code Status:  DNR Disposition: ICU   My cct 35 minutes.   Erick Colace ACNP-BC [place] Pager # [phone] OR # [phone] if no answer

## 2021-04-14 NOTE — Progress Notes (Signed)
Phos 2.1, Mg 1.7 Replaced per protocol

## 2021-04-15 ENCOUNTER — Inpatient Hospital Stay (HOSPITAL_COMMUNITY): Payer: Medicare Other

## 2021-04-15 DIAGNOSIS — M86272 Subacute osteomyelitis, left ankle and foot: Secondary | ICD-10-CM

## 2021-04-15 DIAGNOSIS — R6520 Severe sepsis without septic shock: Secondary | ICD-10-CM

## 2021-04-15 DIAGNOSIS — N390 Urinary tract infection, site not specified: Secondary | ICD-10-CM

## 2021-04-15 DIAGNOSIS — A419 Sepsis, unspecified organism: Secondary | ICD-10-CM | POA: Diagnosis not present

## 2021-04-15 DIAGNOSIS — I2510 Atherosclerotic heart disease of native coronary artery without angina pectoris: Secondary | ICD-10-CM

## 2021-04-15 DIAGNOSIS — Z7189 Other specified counseling: Secondary | ICD-10-CM

## 2021-04-15 DIAGNOSIS — I5020 Unspecified systolic (congestive) heart failure: Secondary | ICD-10-CM | POA: Diagnosis not present

## 2021-04-15 DIAGNOSIS — E039 Hypothyroidism, unspecified: Secondary | ICD-10-CM

## 2021-04-15 LAB — BLOOD CULTURE ID PANEL (REFLEXED) - BCID2

## 2021-04-15 LAB — BASIC METABOLIC PANEL
Anion gap: 4 — ABNORMAL LOW (ref 5–15)
BUN: 23 mg/dL (ref 8–23)
CO2: 29 mmol/L (ref 22–32)
Calcium: 9.3 mg/dL (ref 8.9–10.3)
Chloride: 98 mmol/L (ref 98–111)
Creatinine, Ser: 0.76 mg/dL (ref 0.61–1.24)
GFR, Estimated: >60 mL/min (ref >60)
Glucose, Bld: 89 mg/dL (ref 70–99)
Potassium: 3.6 mmol/L (ref 3.5–5.1)
Sodium: 131 mmol/L — ABNORMAL LOW (ref 135–145)

## 2021-04-15 LAB — GLUCOSE, CAPILLARY: Glucose-Capillary: 80 mg/dL (ref 70–99)

## 2021-04-15 LAB — PHOSPHORUS: Phosphorus: 2.1 mg/dL — ABNORMAL LOW (ref 2.5–4.6)

## 2021-04-15 LAB — MAGNESIUM: Magnesium: 1.8 mg/dL (ref 1.7–2.4)

## 2021-04-15 LAB — PROCALCITONIN: Procalcitonin: 17.7 ng/mL

## 2021-04-15 MED ORDER — POTASSIUM PHOSPHATES 15 MMOLE/5ML IV SOLN
15.0000 mmol | Freq: Once | INTRAVENOUS | Status: AC
  Administered 2021-04-15: 15 mmol via INTRAVENOUS
  Filled 2021-04-15: qty 5

## 2021-04-15 MED ORDER — MAGNESIUM SULFATE 2 GM/50ML IV SOLN
2.0000 g | Freq: Once | INTRAVENOUS | Status: AC
  Administered 2021-04-15: 2 g via INTRAVENOUS
  Filled 2021-04-15: qty 50

## 2021-04-15 NOTE — Progress Notes (Signed)
Phos 2.1, Mg 1.8 Replaced per protocol
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
## 2021-04-15 NOTE — Progress Notes (Signed)
NAME:  Corey Jordan, MRN:  [id number], DOB:  27-Apr-1934, LOS: 2 ADMISSION DATE:  04/13/2021, CONSULTATION DATE: 04/13/2021 REFERRING MD: Dr. Neysa Bonito, CHIEF COMPLAINT: Hypotension  History of Present Illness:  87 year old Bed/chair bound male admitted 5/22 w/ sepsis/septic shock w/ working dx of  UT source.   FTT w/ sacral and heal pressure ulcers and DNR status (POA)  Past Medical History:  Diagnosis Date  . Anxiety & depression   . Atrial fibrillation ([place])   . Cardiac defibrillator in situ: s/p MDT Maximo VR single lead ICD - 07/06/2006   . 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   . Degenerative joint disease   . H/O hiatal hernia, GERD   . History of orthostatic hypotension   . Hyperlipidemia, HTN, Hypothyroidism    . Ischemic cardiomyopathy:   EF 40% 12/2008 V gram;  Echocardiogram 11/08/11: EF 30-35%, inferoseptal and anteroseptal, apical septal/lateral/anterior/inferior and true apical akinesis, no LV thrombus, grade 1 diastolic dysfunction.     . Lumbar disc disease: s/p L5-S1 fusion 12/11/2009  09/19/2011  . NSVT (nonsustained ventricular tachycardia) ([place]), PVCs, PACs.  06/23/2019  . Skin cancer: remote  DNR status (POA) BED Bound Sacral pressure ulcer (POA) Failure to thrive (POA)    Significant Hospital Events: Including procedures, antibiotic start and stop dates in addition to other pertinent events   . 5/22 admitted w/ working dx of septic shock UT source. Cultures sent. Still hypotensive after volume. Started on peripheral pressors. abx Hypotension. Ceftriaxone started. MRSA screen pos.  . 5/23 lactate cleared. Still on pressors. No sig change iin wbc,  . 5/24 off pressors. UC was  negative. Having some loose stools. Family in agreement w/ hospice and palliative care. Would not want large surgical interventions such as amputation should that be a option.    Interim History / Subjective:  Off pressors  Objective   Blood pressure (Abnormal) 109/36, pulse 65, temperature 98.2 F (36.8 C), temperature source Oral, resp. rate 13, height 5\' 7"  (1.702 m), weight 62.5 kg, SpO2 99 %.        Intake/Output Summary (Last 24 hours) at 04/15/2021 0837 Last data filed at 04/15/2021 0300 Gross per 24 hour  Intake 2527.76 ml  Output 650 ml  Net 1877.76 ml   Filed Weights   04/13/21 1800 04/14/21 0500 04/15/21 0500  Weight: 58.2 kg 60.6 kg 62.5 kg    Examination: General: This is a chronically ill-appearing elderly 87 year old male patient is lying in bed he is in no acute distress HEENT normocephalic atraumatic no jugular venous distention is appreciated mucous membranes are moist Pulmonary: Clear to auscultation without accessory use he is currently on room air Cardiac: Regular rate rhythm without murmur rub gallop audible Abdomen: Soft, not tender.  Hypoactive bowel sounds.  Is having episodes of loose stool. GU: Clear yellow urine has condom catheter in place Neuro: Awake, oriented, appropriate, does exhibit generalized weakness Extremities: Warm, dry, brisk capillary refill, no significant edema, she does have scattered areas of ecchymosis.  Sacral wound dressing intact. ,bilateral ankle/heel ulcers dressed. The left is full thickness on posterior heel and about a quarter in size    Labs/imaging that I havepersonally reviewed  (right click  and "Reselect all SmartList Selections" daily)  See below  Resolved Hospital Problem list   Septic shock resolved as of 5/24 Mild azotemia Assessment & Plan:   Severe sepsis.  Shock resolved.  Source not clear, Initial working diagnosis was urinary tract source, however urine culture was clear.  He had a significantly elevated  procalcitonin so I do think he was infected and just not sure where the source was from. Plan Repeating procalcitonin CBC for this morning Day #3 of ceftriaxone Follow-up blood cultures Decrease IV fluid rate Holding Altace and Coreg for another 24 hours Will get imaging of left foot looking for evidence of osteo.  His MRSA PCR was positive back so if PCT and CBC not improved might add vanc   History of HFrEF, coronary artery disease, with remote DES, chronic A. fib, hypertension, and hyperlipidemia. Has PPM No current evidence of decompensated heart failure Plan Okay to discontinue telemetry, we will move him out of the intensive care Old antihypertensives, Coreg, spironolactone and Lasix, can reevaluated on 5/25 continues to be stable hemodynamically  Continue Lipitor and aspirin   Fluid and electrolyte imbalance: Chronic Hyponatremia (was present 2 mo ago) and hypophosphatemia Plan Replace phosphorus Recheck in a.m.  Hypothyroidism  Plan Continue Synthroid replacement  Pressure ulcers involving sacrum, and both heels (present on arrival) Plan Maximize nutrition Pressure relief interventions Wound ostomy nurse consultation  Diarrhea  -new problem. Suspect abx related Plan Supportive care  Failure to thrive as evidenced by protein calorie malnutrition, pressure ulceration, and severe deconditioning all (POA) also DNR status (POA) Plan Full DNR already established  Best practice (right click and "Reselect all SmartList Selections" daily)  Diet:  Oral Pain/Anxiety/Delirium protocol (if indicated): No VAP protocol (if indicated): Not indicated DVT prophylaxis: Subcutaneous Heparin GI prophylaxis: [name] Glucose control:  SSI No Central venous access:  [name] Arterial line:  [name] Foley:  [name] Mobility:  OOB  PT consulted: Yes Last date of multidisciplinary goals of care discussion [pending] Code Status:  DNR Disposition: to medsurg  -I spoke w/ Gerald Stabs the [place] who is  taking care of him. They are very realistic about his goals of care. They are in agreement w/ having hospice involvement.    Erick Colace ACNP-BC [place] Pager # [phone] OR # [phone] if no answer

## 2021-04-15 NOTE — Progress Notes (Signed)
[facility] - BLOOD CULTURE IDENTIFICATION (BCID)  Corey Jordan is an 87 y.o. male who presented to [facility] on 04/13/2021 with a chief complaint of dysuria x 1 week  Assessment:  1 of 4 bottles staph epi  Name of physician (or Provider) Contacted: Dr. Oletta Darter  Current antibiotics: Ceftriaxone  Changes to prescribed antibiotics recommended: Continue current antibiotics, no additional therapy at this time for staph epi as this may be contaminant; monitor for change in clinical status  Results for orders placed or performed during the hospital encounter of 04/13/21  Blood Culture ID Panel (Reflexed) (Collected: 04/13/2021  9:42 AM)  Result Value Ref Range   Enterococcus faecalis NOT DETECTED NOT DETECTED   Enterococcus Faecium NOT DETECTED NOT DETECTED   Listeria monocytogenes NOT DETECTED NOT DETECTED   Staphylococcus species DETECTED (A) NOT DETECTED   Staphylococcus aureus (BCID) NOT DETECTED NOT DETECTED   Staphylococcus epidermidis DETECTED (A) NOT DETECTED   Staphylococcus lugdunensis NOT DETECTED NOT DETECTED   Streptococcus species NOT DETECTED NOT DETECTED   Streptococcus agalactiae NOT DETECTED NOT DETECTED   Streptococcus pneumoniae NOT DETECTED NOT DETECTED   Streptococcus pyogenes NOT DETECTED NOT DETECTED   A.calcoaceticus-baumannii NOT DETECTED NOT DETECTED   Bacteroides fragilis NOT DETECTED NOT DETECTED   Enterobacterales NOT DETECTED NOT DETECTED   Enterobacter cloacae complex NOT DETECTED NOT DETECTED   Escherichia coli NOT DETECTED NOT DETECTED   Klebsiella aerogenes NOT DETECTED NOT DETECTED   Klebsiella oxytoca NOT DETECTED NOT DETECTED   Klebsiella pneumoniae NOT DETECTED NOT DETECTED   Proteus species NOT DETECTED NOT DETECTED   Salmonella species NOT DETECTED NOT DETECTED   Serratia marcescens NOT DETECTED NOT DETECTED   Haemophilus influenzae NOT DETECTED NOT DETECTED   Neisseria meningitidis NOT DETECTED  NOT DETECTED   Pseudomonas aeruginosa NOT DETECTED NOT DETECTED   Stenotrophomonas maltophilia NOT DETECTED NOT DETECTED   Candida albicans NOT DETECTED NOT DETECTED   Candida auris NOT DETECTED NOT DETECTED   Candida glabrata NOT DETECTED NOT DETECTED   Candida krusei NOT DETECTED NOT DETECTED   Candida parapsilosis NOT DETECTED NOT DETECTED   Candida tropicalis NOT DETECTED NOT DETECTED   Cryptococcus neoformans/gattii NOT DETECTED NOT DETECTED   Methicillin resistance mecA/C DETECTED (A) NOT DETECTED    Peggyann Juba, PharmD, BCPS Pharmacy: [phone] 04/15/2021  7:10 PM

## 2021-04-15 NOTE — Progress Notes (Signed)
Report given to RN assuming care on 3rd floor. Pt transported with transport on 4[address] [place], pt belongings sent with as well as chart

## 2021-04-16 ENCOUNTER — Ambulatory Visit: Payer: Medicare Other | Admitting: Orthopaedic Surgery

## 2021-04-16 DIAGNOSIS — L89620 Pressure ulcer of left heel, unstageable: Secondary | ICD-10-CM

## 2021-04-16 DIAGNOSIS — R197 Diarrhea, unspecified: Secondary | ICD-10-CM

## 2021-04-16 DIAGNOSIS — A419 Sepsis, unspecified organism: Secondary | ICD-10-CM | POA: Diagnosis not present

## 2021-04-16 DIAGNOSIS — L89152 Pressure ulcer of sacral region, stage 2: Secondary | ICD-10-CM

## 2021-04-16 DIAGNOSIS — E876 Hypokalemia: Secondary | ICD-10-CM | POA: Diagnosis not present

## 2021-04-16 DIAGNOSIS — I2510 Atherosclerotic heart disease of native coronary artery without angina pectoris: Secondary | ICD-10-CM | POA: Diagnosis not present

## 2021-04-16 LAB — CBC
HCT: 28.4 % — ABNORMAL LOW (ref 39.0–52.0)
Hemoglobin: 8.7 g/dL — ABNORMAL LOW (ref 13.0–17.0)
MCH: 27.6 pg (ref 26.0–34.0)
MCHC: 30.6 g/dL (ref 30.0–36.0)
MCV: 90.2 fL (ref 80.0–100.0)
Platelets: 204 10*3/uL (ref 150–400)
RBC: 3.15 MIL/uL — ABNORMAL LOW (ref 4.22–5.81)
RDW: 16.3 % — ABNORMAL HIGH (ref 11.5–15.5)
WBC: 6.7 10*3/uL (ref 4.0–10.5)
nRBC: 0 % (ref 0.0–0.2)

## 2021-04-16 LAB — MAGNESIUM: Magnesium: 2 mg/dL (ref 1.7–2.4)

## 2021-04-16 LAB — BASIC METABOLIC PANEL
Anion gap: 6 (ref 5–15)
BUN: 15 mg/dL (ref 8–23)
CO2: 30 mmol/L (ref 22–32)
Calcium: 8.9 mg/dL (ref 8.9–10.3)
Chloride: 99 mmol/L (ref 98–111)
Creatinine, Ser: 0.58 mg/dL — ABNORMAL LOW (ref 0.61–1.24)
GFR, Estimated: >60 mL/min (ref >60)
Glucose, Bld: 88 mg/dL (ref 70–99)
Potassium: 3.3 mmol/L — ABNORMAL LOW (ref 3.5–5.1)
Sodium: 135 mmol/L (ref 135–145)

## 2021-04-16 LAB — PHOSPHORUS: Phosphorus: 1.8 mg/dL — ABNORMAL LOW (ref 2.5–4.6)

## 2021-04-16 LAB — PROCALCITONIN: Procalcitonin: 7.77 ng/mL

## 2021-04-16 MED ORDER — POTASSIUM CHLORIDE CRYS ER 20 MEQ PO TBCR
40.0000 meq | EXTENDED_RELEASE_TABLET | Freq: Once | ORAL | Status: AC
  Administered 2021-04-16: 40 meq via ORAL
  Filled 2021-04-16: qty 2

## 2021-04-16 MED ORDER — LOPERAMIDE HCL 2 MG PO CAPS: 2.0000 mg | ORAL_CAPSULE | ORAL | Status: DC | PRN

## 2021-04-16 MED ORDER — POTASSIUM PHOSPHATES 15 MMOLE/5ML IV SOLN
30.0000 mmol | Freq: Once | INTRAVENOUS | Status: AC
  Administered 2021-04-16: 30 mmol via INTRAVENOUS
  Filled 2021-04-16: qty 10

## 2021-04-16 MED ORDER — LOPERAMIDE HCL 2 MG PO CAPS
4.0000 mg | ORAL_CAPSULE | Freq: Once | ORAL | Status: AC
  Administered 2021-04-16: 4 mg via ORAL
  Filled 2021-04-16: qty 2
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
## 2021-04-16 NOTE — Care Management Important Message (Signed)
Important Message  Patient Details IM Letter given to the Patient. Name: Corey Jordan MRN: [id number] Date of Birth: [birth year]   Medicare Important Message Given:  Yes     Kerin Salen 04/16/2021, 11:32 AM

## 2021-04-17 DIAGNOSIS — L89152 Pressure ulcer of sacral region, stage 2: Secondary | ICD-10-CM | POA: Diagnosis not present

## 2021-04-17 DIAGNOSIS — Z95810 Presence of automatic (implantable) cardiac defibrillator: Secondary | ICD-10-CM

## 2021-04-17 DIAGNOSIS — Z7189 Other specified counseling: Secondary | ICD-10-CM | POA: Diagnosis not present

## 2021-04-17 DIAGNOSIS — I10 Essential (primary) hypertension: Secondary | ICD-10-CM

## 2021-04-17 DIAGNOSIS — N390 Urinary tract infection, site not specified: Secondary | ICD-10-CM

## 2021-04-17 DIAGNOSIS — R627 Adult failure to thrive: Secondary | ICD-10-CM

## 2021-04-17 DIAGNOSIS — I5022 Chronic systolic (congestive) heart failure: Secondary | ICD-10-CM | POA: Diagnosis not present

## 2021-04-17 DIAGNOSIS — I2510 Atherosclerotic heart disease of native coronary artery without angina pectoris: Secondary | ICD-10-CM | POA: Diagnosis not present

## 2021-04-17 DIAGNOSIS — A419 Sepsis, unspecified organism: Secondary | ICD-10-CM | POA: Diagnosis not present

## 2021-04-17 DIAGNOSIS — R197 Diarrhea, unspecified: Secondary | ICD-10-CM | POA: Diagnosis not present

## 2021-04-17 LAB — RENAL FUNCTION PANEL
Albumin: 2 g/dL — ABNORMAL LOW (ref 3.5–5.0)
Anion gap: 8 (ref 5–15)
BUN: 14 mg/dL (ref 8–23)
CO2: 27 mmol/L (ref 22–32)
Calcium: 8.9 mg/dL (ref 8.9–10.3)
Chloride: 100 mmol/L (ref 98–111)
Creatinine, Ser: 0.37 mg/dL — ABNORMAL LOW (ref 0.61–1.24)
GFR, Estimated: >60 mL/min (ref >60)
Glucose, Bld: 87 mg/dL (ref 70–99)
Phosphorus: 2.1 mg/dL — ABNORMAL LOW (ref 2.5–4.6)
Potassium: 3.8 mmol/L (ref 3.5–5.1)
Sodium: 135 mmol/L (ref 135–145)

## 2021-04-17 LAB — CBC WITH DIFFERENTIAL/PLATELET
Abs Immature Granulocytes: 0.03 10*3/uL (ref 0.00–0.07)
Basophils Absolute: 0 10*3/uL (ref 0.0–0.1)
Basophils Relative: 0 %
Eosinophils Absolute: 0.2 10*3/uL (ref 0.0–0.5)
Eosinophils Relative: 3 %
HCT: 29.2 % — ABNORMAL LOW (ref 39.0–52.0)
Hemoglobin: 8.9 g/dL — ABNORMAL LOW (ref 13.0–17.0)
Immature Granulocytes: 1 %
Lymphocytes Relative: 19 %
Lymphs Abs: 1.2 10*3/uL (ref 0.7–4.0)
MCH: 27.6 pg (ref 26.0–34.0)
MCHC: 30.5 g/dL (ref 30.0–36.0)
MCV: 90.7 fL (ref 80.0–100.0)
Monocytes Absolute: 0.8 10*3/uL (ref 0.1–1.0)
Monocytes Relative: 13 %
Neutro Abs: 4.2 10*3/uL (ref 1.7–7.7)
Neutrophils Relative %: 64 %
Platelets: 213 10*3/uL (ref 150–400)
RBC: 3.22 MIL/uL — ABNORMAL LOW (ref 4.22–5.81)
RDW: 16.3 % — ABNORMAL HIGH (ref 11.5–15.5)
WBC: 6.4 10*3/uL (ref 4.0–10.5)
nRBC: 0 % (ref 0.0–0.2)

## 2021-04-17 LAB — CULTURE, BLOOD (ROUTINE X 2): Special Requests: ADEQUATE

## 2021-04-17 LAB — PROCALCITONIN: Procalcitonin: 4.68 ng/mL

## 2021-04-17 LAB — MAGNESIUM: Magnesium: 1.8 mg/dL (ref 1.7–2.4)

## 2021-04-17 MED ORDER — POTASSIUM PHOSPHATES 15 MMOLE/5ML IV SOLN
30.0000 mmol | Freq: Once | INTRAVENOUS | Status: AC
  Administered 2021-04-17: 30 mmol via INTRAVENOUS
  Filled 2021-04-17: qty 10

## 2021-04-17 MED ORDER — MAGNESIUM SULFATE 2 GM/50ML IV SOLN
2.0000 g | Freq: Once | INTRAVENOUS | Status: AC
  Administered 2021-04-17: 2 g via INTRAVENOUS
  Filled 2021-04-17: qty 50
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
## 2021-04-17 NOTE — Progress Notes (Signed)
No loose stool to collect and send for C-diff.

## 2021-04-18 DIAGNOSIS — A419 Sepsis, unspecified organism: Secondary | ICD-10-CM | POA: Diagnosis not present

## 2021-04-18 DIAGNOSIS — R197 Diarrhea, unspecified: Secondary | ICD-10-CM | POA: Diagnosis not present

## 2021-04-18 DIAGNOSIS — I2510 Atherosclerotic heart disease of native coronary artery without angina pectoris: Secondary | ICD-10-CM | POA: Diagnosis not present

## 2021-04-18 DIAGNOSIS — R627 Adult failure to thrive: Secondary | ICD-10-CM | POA: Diagnosis not present

## 2021-04-18 LAB — BASIC METABOLIC PANEL
Anion gap: 4 — ABNORMAL LOW (ref 5–15)
BUN: 14 mg/dL (ref 8–23)
CO2: 31 mmol/L (ref 22–32)
Calcium: 8.9 mg/dL (ref 8.9–10.3)
Chloride: 98 mmol/L (ref 98–111)
Creatinine, Ser: 0.63 mg/dL (ref 0.61–1.24)
GFR, Estimated: >60 mL/min (ref >60)
Glucose, Bld: 90 mg/dL (ref 70–99)
Potassium: 4 mmol/L (ref 3.5–5.1)
Sodium: 133 mmol/L — ABNORMAL LOW (ref 135–145)

## 2021-04-18 LAB — CBC WITH DIFFERENTIAL/PLATELET
Abs Immature Granulocytes: 0.03 10*3/uL (ref 0.00–0.07)
Basophils Absolute: 0 10*3/uL (ref 0.0–0.1)
Basophils Relative: 0 %
Eosinophils Absolute: 0.2 10*3/uL (ref 0.0–0.5)
Eosinophils Relative: 3 %
HCT: 28.8 % — ABNORMAL LOW (ref 39.0–52.0)
Hemoglobin: 8.9 g/dL — ABNORMAL LOW (ref 13.0–17.0)
Immature Granulocytes: 1 %
Lymphocytes Relative: 20 %
Lymphs Abs: 1.2 10*3/uL (ref 0.7–4.0)
MCH: 28.3 pg (ref 26.0–34.0)
MCHC: 30.9 g/dL (ref 30.0–36.0)
MCV: 91.4 fL (ref 80.0–100.0)
Monocytes Absolute: 0.7 10*3/uL (ref 0.1–1.0)
Monocytes Relative: 11 %
Neutro Abs: 3.9 10*3/uL (ref 1.7–7.7)
Neutrophils Relative %: 65 %
Platelets: 238 10*3/uL (ref 150–400)
RBC: 3.15 MIL/uL — ABNORMAL LOW (ref 4.22–5.81)
RDW: 16.1 % — ABNORMAL HIGH (ref 11.5–15.5)
WBC: 6 10*3/uL (ref 4.0–10.5)
nRBC: 0 % (ref 0.0–0.2)

## 2021-04-18 LAB — MAGNESIUM: Magnesium: 2 mg/dL (ref 1.7–2.4)

## 2021-04-18 LAB — CULTURE, BLOOD (ROUTINE X 2)
Culture: NO GROWTH
Special Requests: ADEQUATE

## 2021-04-18 NOTE — Plan of Care (Signed)
  Problem: Coping: Goal: Level of anxiety will decrease Outcome: Progressing   Problem: Pain Managment: Goal: General experience of comfort will improve Outcome: Progressing   Problem: Safety: Goal: Ability to remain free from injury will improve Outcome: Progressing   
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
## 2021-04-18 NOTE — Progress Notes (Signed)
PT Cancellation Note  Patient Details Name: Corey Jordan MRN: [id number] DOB: [birth year]   Cancelled Treatment:    Reason Eval/Treat Not Completed: Patient declined, states that he is needing to have a BM. Patient WC dependent  PTA, Appears may DC home with Hospice/Palliative.   Claretha Cooper 04/18/2021, 1:11 PM  [place] Pager [phone] Office [phone]

## 2021-04-19 DIAGNOSIS — A419 Sepsis, unspecified organism: Secondary | ICD-10-CM | POA: Diagnosis not present

## 2021-04-19 DIAGNOSIS — I2510 Atherosclerotic heart disease of native coronary artery without angina pectoris: Secondary | ICD-10-CM | POA: Diagnosis not present

## 2021-04-19 DIAGNOSIS — R627 Adult failure to thrive: Secondary | ICD-10-CM | POA: Diagnosis not present

## 2021-04-19 DIAGNOSIS — R197 Diarrhea, unspecified: Secondary | ICD-10-CM | POA: Diagnosis not present

## 2021-04-19 LAB — GASTROINTESTINAL PANEL BY PCR, STOOL (REPLACES STOOL CULTURE)

## 2021-04-19 LAB — RENAL FUNCTION PANEL
Albumin: 2.2 g/dL — ABNORMAL LOW (ref 3.5–5.0)
Anion gap: 4 — ABNORMAL LOW (ref 5–15)
BUN: 14 mg/dL (ref 8–23)
CO2: 34 mmol/L — ABNORMAL HIGH (ref 22–32)
Calcium: 9.4 mg/dL (ref 8.9–10.3)
Chloride: 97 mmol/L — ABNORMAL LOW (ref 98–111)
Creatinine, Ser: 0.53 mg/dL — ABNORMAL LOW (ref 0.61–1.24)
GFR, Estimated: >60 mL/min (ref >60)
Glucose, Bld: 85 mg/dL (ref 70–99)
Phosphorus: 2.4 mg/dL — ABNORMAL LOW (ref 2.5–4.6)
Potassium: 5 mmol/L (ref 3.5–5.1)
Sodium: 135 mmol/L (ref 135–145)

## 2021-04-19 LAB — POTASSIUM: Potassium: 3.8 mmol/L (ref 3.5–5.1)

## 2021-04-19 MED ORDER — K PHOS MONO-SOD PHOS DI & MONO 155-852-130 MG PO TABS
250.0000 mg | ORAL_TABLET | Freq: Four times a day (QID) | ORAL | Status: AC
  Administered 2021-04-19 – 2021-04-20 (×6): 250 mg via ORAL
  Filled 2021-04-19 (×7): qty 1

## 2021-04-19 MED ORDER — DICLOFENAC SODIUM 1 % EX GEL: 1.0000 "application " | Freq: Two times a day (BID) | CUTANEOUS | Status: DC | PRN

## 2021-04-19 MED ORDER — CALCIUM CARBONATE 1250 (500 CA) MG PO TABS
1.0000 | ORAL_TABLET | Freq: Every day | ORAL | Status: DC
  Administered 2021-04-20 – 2021-04-21 (×2): 500 mg via ORAL
  Filled 2021-04-19 (×2): qty 1

## 2021-04-19 MED ORDER — CARVEDILOL 6.25 MG PO TABS
6.2500 mg | ORAL_TABLET | Freq: Two times a day (BID) | ORAL | Status: DC
  Administered 2021-04-19 – 2021-04-21 (×4): 6.25 mg via ORAL
  Filled 2021-04-19 (×4): qty 1

## 2021-04-19 MED ORDER — IPRATROPIUM-ALBUTEROL 0.5-2.5 (3) MG/3ML IN SOLN: 3.0000 mL | RESPIRATORY_TRACT | Status: DC | PRN

## 2021-04-19 NOTE — Progress Notes (Signed)
Pharmacy Electrolyte Replacement  Recent Labs:  Recent Labs    04/18/21 0452 04/19/21 0350  K 4.0 5.0  MG 2.0  --   PHOS  --  2.4*  CREATININE 0.63 0.53*    Low Critical Values (K </= 2.5, Phos </= 1, Mg </= 1) Present: K elevated, none given Repeat K ordered  MD Contacted: Dr Grandville Silos requested Phosphorus replacement, can use po  Plan:  K Phos Neutral 250 qid (contains only 1 mEq potassium) BMET, Phos level in am  Minda Ditto PharmD 04/19/2021, 8:36 AM
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
## 2021-04-20 DIAGNOSIS — I2510 Atherosclerotic heart disease of native coronary artery without angina pectoris: Secondary | ICD-10-CM | POA: Diagnosis not present

## 2021-04-20 DIAGNOSIS — R627 Adult failure to thrive: Secondary | ICD-10-CM | POA: Diagnosis not present

## 2021-04-20 DIAGNOSIS — R197 Diarrhea, unspecified: Secondary | ICD-10-CM | POA: Diagnosis not present

## 2021-04-20 DIAGNOSIS — A419 Sepsis, unspecified organism: Secondary | ICD-10-CM | POA: Diagnosis not present

## 2021-04-20 LAB — BASIC METABOLIC PANEL
Anion gap: 3 — ABNORMAL LOW (ref 5–15)
BUN: 12 mg/dL (ref 8–23)
CO2: 35 mmol/L — ABNORMAL HIGH (ref 22–32)
Calcium: 9.4 mg/dL (ref 8.9–10.3)
Chloride: 97 mmol/L — ABNORMAL LOW (ref 98–111)
Creatinine, Ser: 0.64 mg/dL (ref 0.61–1.24)
GFR, Estimated: >60 mL/min (ref >60)
Glucose, Bld: 89 mg/dL (ref 70–99)
Potassium: 3.9 mmol/L (ref 3.5–5.1)
Sodium: 135 mmol/L (ref 135–145)

## 2021-04-20 LAB — PHOSPHORUS: Phosphorus: 3 mg/dL (ref 2.5–4.6)

## 2021-04-20 LAB — MAGNESIUM: Magnesium: 2 mg/dL (ref 1.7–2.4)

## 2021-04-20 MED ORDER — LOPERAMIDE HCL 2 MG PO CAPS
4.0000 mg | ORAL_CAPSULE | Freq: Once | ORAL | Status: AC
  Administered 2021-04-20: 4 mg via ORAL
  Filled 2021-04-20: qty 2
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
## 2021-04-21 DIAGNOSIS — L89152 Pressure ulcer of sacral region, stage 2: Secondary | ICD-10-CM | POA: Diagnosis not present

## 2021-04-21 DIAGNOSIS — I2510 Atherosclerotic heart disease of native coronary artery without angina pectoris: Secondary | ICD-10-CM | POA: Diagnosis not present

## 2021-04-21 DIAGNOSIS — I959 Hypotension, unspecified: Secondary | ICD-10-CM

## 2021-04-21 DIAGNOSIS — A419 Sepsis, unspecified organism: Secondary | ICD-10-CM | POA: Diagnosis not present

## 2021-04-21 DIAGNOSIS — R197 Diarrhea, unspecified: Secondary | ICD-10-CM | POA: Diagnosis not present

## 2021-04-21 DIAGNOSIS — R627 Adult failure to thrive: Secondary | ICD-10-CM | POA: Diagnosis not present

## 2021-04-21 DIAGNOSIS — L89620 Pressure ulcer of left heel, unstageable: Secondary | ICD-10-CM | POA: Diagnosis not present

## 2021-04-21 MED ORDER — LOPERAMIDE HCL 2 MG PO CAPS: 2.0000 mg | ORAL_CAPSULE | ORAL | 0 refills | Status: AC | PRN

## 2021-04-21 MED ORDER — FUROSEMIDE 20 MG PO TABS: 20.0000 mg | ORAL_TABLET | Freq: Every morning | ORAL | Status: AC

## 2021-04-21 MED ORDER — SPIRONOLACTONE 25 MG PO TABS: 12.5000 mg | ORAL_TABLET | Freq: Every day | ORAL | Status: AC

## 2021-04-21 MED ORDER — POTASSIUM CHLORIDE ER 10 MEQ PO TBCR: 10.0000 meq | EXTENDED_RELEASE_TABLET | Freq: Two times a day (BID) | ORAL | Status: AC

## 2021-04-21 NOTE — Care Management Important Message (Signed)
Medicare IM given to the patient by Zarinah Oviatt. 

## 2021-04-21 NOTE — TOC Transition Note (Signed)
Transition of Care [facility]) - CM/SW Discharge Note  Patient Details  Name: JALEEN FINCH MRN: [id number] Date of Birth: January 26, 1934  Transition of Care [facility]) CM/SW Contact:  Sherie Don, LCSW Phone Number: 04/21/2021, 12:05 PM  Clinical Narrative: Patient is medically ready to discharge home. CSW notified Olivia Mackie with [facility] palliative so that patient can be seen outpatient. CSW called DIL, [name], regarding discharge and [place] services. Per DIL, patient was already active with [facility] through [facility]. DIL asked about PCP assistance. As patient is insured, CSW explained to DIL family can call the number of the back of the patient's Medicare card or use the Centers for Medicare and Medicaid Services' Physician Compare website to find a new PCP. CSW notified Lauren with [facility] that [place] orders have been placed. Medical necessity form completed; PTAR scheduled. CSW notified DIL of approximate pick-up time. TOC signing off.  Final next level of care: [place] Barriers to Discharge: Barriers Resolved  Patient Goals and CMS Choice Patient states their goals for this hospitalization and ongoing recovery are:: i would like to go home CMS Medicare.gov Compare Post Acute Care list provided to:: Patient Choice offered to / list presented to : Patient  Discharge Plan and Services Discharge Planning Services: CM Consult         DME Arranged: [name] DME Agency: NA HH Arranged: PT,OT,RN,Nurse's Aide,Social Work [place]: [place] Date [place]: 04/21/21 Representative spoke with at [place]: [place]  Readmission Risk Interventions Readmission Risk Prevention Plan 01/24/2021  Post Dischage Appt Not Complete  Appt Comments Pt discharging to SNF  Medication Screening Complete  Transportation Screening Complete  Some recent data might be hidden

## 2021-04-21 NOTE — Progress Notes (Signed)
Patient dressed waiting on PTAR for discharge
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
## 2021-04-22 ENCOUNTER — Telehealth: Payer: Self-pay

## 2021-04-22 NOTE — Telephone Encounter (Signed)
Transition Care Management Follow-up Telephone Call  Call completed with patient's daughter in law, Corey Jordan.  Date of discharge and from where: 04/21/2021, [facility]   How have you been since you were released from the hospital? Corey Jordan said that he is doing okay but he has reported extreme pain in both legs from the knees down.  Oxycodone has not helped to alleviate the pain.  Corey Jordan said that she spoke to the doctors in the hospital about this but no one told her what is going on with his legs and she would like the know the reason for the pain. Informed her that Dr Juleen China would be notified of her concern. She said that before he went into the hospital he was able to stand with the walker and transfer and now he is bedbound.    Any questions or concerns? Yes - noted above. She said she has not heard from [place] yet. This CM called [facility], spoke to [place] who confirmed that they have the referral and someone should be reaching out to [place] later today or tomorrow.  This information was then  shared with Corey Jordan.   Items Reviewed:  Did the pt receive and understand the discharge instructions provided? Yes   Medications obtained and verified? Yes she said that he has all medications, she manages the med regime and as per AVS she understands the potassium chloride, spironolactone and furosemide are to be started on 04/28/2021.   Other? No   Any new allergies since your discharge? No   Do you have support at home? Yes , his family.    Home Care and Equipment/Supplies: Were home health services ordered? yes If so, what is the name of the agency? [facility].    Has the agency set up a time to come to the patient's home? No - Corey Jordan was concerned because she said she needs help right away. She called [facility] earlier today and has not heard back.   This CM called [facility] and spoke to [place] who said that they have the referral but they are not sure if SN or  PT would be first to see the patient.  Corey Jordan will receive a call from either the nurse or PT later today.   This information was shared with Corey Jordan.   Were any new equipment or medical supplies ordered?  Yes: Corey Jordan has ordered many supplies for the patient including: hospital bed, hoyer lift, wheelchair, bedpan. walker. , O2 and incontinence supplies.  and bedside commode.  What is the name of the medical supply agency? Not addressed.   Were you able to get the supplies/equipment? If he has not already received it, she is expecting a delivery of remaining items  Do you have any questions related to the use of the equipment or supplies? No   He already has a nebulizer   Functional Questionnaire: (I = Independent and D = Dependent) ADLs: dependent. Family provides care. Corey Jordan manages medication regime.   Follow up appointments reviewed:   PCP Hospital f/u appt confirmed? Yes  - Dr Juleen China - 04/24/2021. Virtual visit   [place] [facility] f/u appt confirmed? No , none scheduled at this time.    Are transportation arrangements needed? No , he is not able to leave the house.   If their condition worsens, is the pt aware to call PCP or go to the Emergency Dept.? Yes  Was the patient provided with contact information for the PCP's office or ED? Yes  Was to pt encouraged to call back with questions or concerns? Yes

## 2021-04-23 ENCOUNTER — Telehealth: Payer: Self-pay

## 2021-04-23 DIAGNOSIS — L89323 Pressure ulcer of left buttock, stage 3: Secondary | ICD-10-CM | POA: Diagnosis not present

## 2021-04-23 DIAGNOSIS — S37892D Contusion of other urinary and pelvic organ, subsequent encounter: Secondary | ICD-10-CM | POA: Diagnosis not present

## 2021-04-23 DIAGNOSIS — S3282XD Multiple fractures of pelvis without disruption of pelvic ring, subsequent encounter for fracture with routine healing: Secondary | ICD-10-CM | POA: Diagnosis not present

## 2021-04-23 DIAGNOSIS — S32501D Unspecified fracture of right pubis, subsequent encounter for fracture with routine healing: Secondary | ICD-10-CM | POA: Diagnosis not present

## 2021-04-23 DIAGNOSIS — D62 Acute posthemorrhagic anemia: Secondary | ICD-10-CM | POA: Diagnosis not present

## 2021-04-23 DIAGNOSIS — L89620 Pressure ulcer of left heel, unstageable: Secondary | ICD-10-CM | POA: Diagnosis not present

## 2021-04-23 NOTE — Telephone Encounter (Signed)
Spoke with patient's DIL [name] she states patient was supposed to be referred for Hospice per Zacarias Pontes MD's patient is hospice eligible. DIL is requesting a call back with an hospice AV visit ASAP. She is very frustrated that the hospital and [facility] has told her so many things and no one has visited the patient yet. Email sent to referral specialist.

## 2021-04-24 ENCOUNTER — Telehealth: Payer: Self-pay | Admitting: Internal Medicine

## 2021-04-24 ENCOUNTER — Encounter: Payer: Self-pay | Admitting: Internal Medicine

## 2021-04-24 ENCOUNTER — Other Ambulatory Visit: Payer: Self-pay

## 2021-04-24 ENCOUNTER — Telehealth (INDEPENDENT_AMBULATORY_CARE_PROVIDER_SITE_OTHER): Payer: Medicare Other | Admitting: Internal Medicine

## 2021-04-24 DIAGNOSIS — L89623 Pressure ulcer of left heel, stage 3: Secondary | ICD-10-CM | POA: Diagnosis not present

## 2021-04-24 DIAGNOSIS — E039 Hypothyroidism, unspecified: Secondary | ICD-10-CM | POA: Diagnosis not present

## 2021-04-24 DIAGNOSIS — I4891 Unspecified atrial fibrillation: Secondary | ICD-10-CM | POA: Diagnosis not present

## 2021-04-24 DIAGNOSIS — J9611 Chronic respiratory failure with hypoxia: Secondary | ICD-10-CM | POA: Diagnosis not present

## 2021-04-24 DIAGNOSIS — Z515 Encounter for palliative care: Secondary | ICD-10-CM | POA: Diagnosis not present

## 2021-04-24 DIAGNOSIS — Z950 Presence of cardiac pacemaker: Secondary | ICD-10-CM | POA: Diagnosis not present

## 2021-04-24 DIAGNOSIS — E876 Hypokalemia: Secondary | ICD-10-CM

## 2021-04-24 DIAGNOSIS — F32A Depression, unspecified: Secondary | ICD-10-CM | POA: Diagnosis not present

## 2021-04-24 DIAGNOSIS — I5022 Chronic systolic (congestive) heart failure: Secondary | ICD-10-CM

## 2021-04-24 DIAGNOSIS — M1990 Unspecified osteoarthritis, unspecified site: Secondary | ICD-10-CM | POA: Diagnosis not present

## 2021-04-24 DIAGNOSIS — L89150 Pressure ulcer of sacral region, unstageable: Secondary | ICD-10-CM | POA: Diagnosis not present

## 2021-04-24 DIAGNOSIS — E785 Hyperlipidemia, unspecified: Secondary | ICD-10-CM | POA: Diagnosis not present

## 2021-04-24 DIAGNOSIS — L89610 Pressure ulcer of right heel, unstageable: Secondary | ICD-10-CM | POA: Diagnosis not present

## 2021-04-24 DIAGNOSIS — E43 Unspecified severe protein-calorie malnutrition: Secondary | ICD-10-CM | POA: Diagnosis not present

## 2021-04-24 DIAGNOSIS — Z09 Encounter for follow-up examination after completed treatment for conditions other than malignant neoplasm: Secondary | ICD-10-CM | POA: Diagnosis not present

## 2021-04-24 DIAGNOSIS — Z95810 Presence of automatic (implantable) cardiac defibrillator: Secondary | ICD-10-CM | POA: Diagnosis not present

## 2021-04-24 DIAGNOSIS — R159 Full incontinence of feces: Secondary | ICD-10-CM | POA: Diagnosis not present

## 2021-04-24 DIAGNOSIS — I472 Ventricular tachycardia: Secondary | ICD-10-CM | POA: Diagnosis not present

## 2021-04-24 DIAGNOSIS — L89892 Pressure ulcer of other site, stage 2: Secondary | ICD-10-CM | POA: Diagnosis not present

## 2021-04-24 DIAGNOSIS — R32 Unspecified urinary incontinence: Secondary | ICD-10-CM | POA: Diagnosis not present

## 2021-04-24 DIAGNOSIS — Z681 Body mass index (BMI) 19 or less, adult: Secondary | ICD-10-CM | POA: Diagnosis not present

## 2021-04-24 DIAGNOSIS — K219 Gastro-esophageal reflux disease without esophagitis: Secondary | ICD-10-CM | POA: Diagnosis not present

## 2021-04-24 DIAGNOSIS — I951 Orthostatic hypotension: Secondary | ICD-10-CM | POA: Diagnosis not present

## 2021-04-24 DIAGNOSIS — D649 Anemia, unspecified: Secondary | ICD-10-CM | POA: Diagnosis not present

## 2021-04-24 DIAGNOSIS — Z8781 Personal history of (healed) traumatic fracture: Secondary | ICD-10-CM | POA: Diagnosis not present

## 2021-04-24 NOTE — Telephone Encounter (Signed)
Roderic Ovens [phone] from [facility] called asking If Durene Fruits NP will take over pt's care and sign any orders that will be needed while pt is under Hospice Care?

## 2021-04-24 NOTE — Progress Notes (Signed)
Does he need to continue  furosemide Potassium  Pain both legs all the time

## 2021-04-24 NOTE — Progress Notes (Signed)
Virtual Visit via Telephone Note  I connected with Corey Jordan, on 04/24/2021 at 10:35 AM by telephone due to the COVID-19 pandemic and verified that I am speaking with the correct person using two identifiers.   Consent: I discussed the limitations, risks, security and privacy concerns of performing an evaluation and management service by telephone and the availability of in person appointments. I also discussed with the patient that there may be a patient responsible charge related to this service. The patient expressed understanding and agreed to proceed.   Location of Patient: Home   Location of Provider: Clinic    Persons participating in Telemedicine visit: [name] [name]-patient's DIL   History of Present Illness: Patient has a visit for hospital follow up. Was admitted from 5/22-5/30 for severe sepsis with septic shock, working diagnosis was UTI. Completed antibiotics while hospitalized. Patient with a history of CHF. Spironolactone and Lasix were held during hospitalization and instructed to resume 1 week post discharge. Palliative care was consulted during hospitalization.   Daughter in law has a lot of concerns about his physical abilities and cognitive function. She reports that he is unable to leave the home due to pain. Has had lift and wheelchair brought into house and still having problems with mobility. Patient's caretaker is working on getting hospice in place. Caretaker has a lot of concerns about medication list.   BPs have been sometimes in the 70-80s/50s at home. Has not yet resumed Lasix or Spironolactone.    Past Medical History:  Diagnosis Date  . Anxiety   . Atrial fibrillation ([place])   . Cardiac defibrillator in situ    s/p MDT Maximo VR single lead ICD - 07/06/2006  . 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  . Degenerative joint disease   . DEGENERATIVE JOINT DISEASE 04/09/2009   Qualifier: Diagnosis of  By: Darrick Meigs    . Depression   . GE reflux 09/19/2011  . H/O hiatal hernia    times 3  . History of orthostatic hypotension   . Hyperlipidemia   . Hypertension   . Hypothyroidism   . Ischemic cardiomyopathy    EF 40% 12/2008 V gram;  Echocardiogram 11/08/11: EF 30-35%, inferoseptal and anteroseptal, apical septal/lateral/anterior/inferior and true apical akinesis, no LV thrombus, grade 1 diastolic dysfunction.  . Lumbar disc disease 09/19/2011  . Lumbar spondylosis    s/p L5-S1 fusion 12/11/2009  . NSVT (nonsustained ventricular tachycardia) ([place]) 06/23/2019  . Premature atrial contractions   . Premature ventricular contractions   . Skin cancer    skin  . Systolic CHF, chronic (HCC)    Allergies  Allergen Reactions  . Pollen Extract Other (See Comments)    Sneezing and watery eyes    Current Outpatient Medications on File Prior to Visit  Medication Sig Dispense Refill  . acetaminophen (TYLENOL) 500 MG tablet Take 1,000 mg by mouth every 6 (six) hours as needed for fever.    Marland Kitchen aspirin 81 MG EC tablet Take 81 mg by mouth every morning.    Marland Kitchen atorvastatin (LIPITOR) 40 MG tablet TAKE 1 TABLET BY MOUTH DAILY (Patient taking differently: Take 40 mg  by mouth at bedtime.) 90 tablet 3  . calcium carbonate (OS-CAL) 600 MG TABS Take 600 mg by mouth daily.    . carvedilol (COREG) 6.25 MG tablet TAKE 1 TABLET(6.25 MG) BY MOUTH TWICE DAILY WITH A MEAL (Patient taking differently: Take 6.25 mg by mouth 2 (two) times daily with a meal.) 180 tablet 3  . diclofenac Sodium (VOLTAREN) 1 % GEL Apply 1 application topically every 12 (twelve) hours as needed (knee pain).    Marland Kitchen ipratropium-albuterol (DUONEB) 0.5-2.5  (3) MG/3ML SOLN Take 3 mLs by nebulization every 4 (four) hours as needed (shortness of breath/wheezing).    Marland Kitchen levothyroxine (SYNTHROID) 50 MCG tablet TAKE 1 TABLET(50 MCG) BY MOUTH EVERY MORNING (Patient taking differently: Take 50 mcg by mouth every morning.) 90 tablet 3  . nitroGLYCERIN (NITROSTAT) 0.4 MG SL tablet PLACE 1 TABLET UNDER THE TONGUE IF NEEDED EVERY 5 MINUTES FOR CHEST PAIN FOR 3 DOSES IF NO RELIEF AFTER FIRST DOSE CALL PRESCRIBER OR 911 (Patient taking differently: Place 0.4 mg under the tongue every 5 (five) minutes as needed for chest pain.) 25 tablet 6  . oxyCODONE (OXY IR/ROXICODONE) 5 MG immediate release tablet Take 0.5-1 tablets (2.5-5 mg total) by mouth every 4 (four) hours as needed for moderate pain or severe pain. 15 tablet 0  . senna-docusate (SENOKOT-S) 8.6-50 MG tablet Take 1 tablet by mouth daily.    Derrill Memo ON 04/28/2021] furosemide (LASIX) 20 MG tablet Take 1 tablet (20 mg total) by mouth every morning. (Patient not taking: Reported on 04/24/2021) 30 tablet   . loperamide (IMODIUM) 2 MG capsule Take 1 capsule (2 mg total) by mouth as needed for diarrhea or loose stools. (Patient not taking: Reported on 04/24/2021) 30 capsule 0  . Phenazopyridine HCl (AZO STANDARD MAXIMUM STRENGTH PO) Take 2 tablets by mouth once. (Patient not taking: Reported on 04/24/2021)    . [START ON 04/28/2021] potassium chloride (KLOR-CON) 10 MEQ tablet Take 1 tablet (10 mEq total) by mouth 2 (two) times daily. (Patient not taking: Reported on 04/24/2021)    . [START ON 04/28/2021] spironolactone (ALDACTONE) 25 MG tablet Take 0.5 tablets (12.5 mg total) by mouth daily. (Patient not taking: Reported on 04/24/2021)     No current facility-administered medications on file prior to visit.    Observations/Objective: NAD. Speaking clearly.  Work of breathing normal.  Alert and oriented. Mood appropriate.   Assessment and Plan: 1. Hospital discharge follow-up Reviewed hospital course, current medications,  ensured proper f/u in place, and addressed concerns.   2. Hospice care patient  3. Systolic CHF, chronic (HCC) At present, caretaker reports volume status is stable. Will hold off on Lasix and Spironolactone given low BPs reported at home and inability to monitor labs due to patient debility with recent electrolyte derangements.   4. Hypokalemia K 3.9 prior to d/c. Will eventually need to be monitored.   5. Hypophosphatemia Phosphorus 3.0 prior to discharge.   6. Anemia, unspecified type HgB 8.9 prior to d/c. No active bleeding noted per caregiver. Will need to be monitored in future.    Follow Up Instructions: Per hospice provider    I discussed the assessment and treatment plan with the patient. The patient was provided an opportunity to ask questions and all were answered. The patient agreed with the plan and demonstrated an understanding of the instructions.   The patient was advised to call back or seek an in-person evaluation if the symptoms worsen or if the condition fails to improve as anticipated.  I provided 26 minutes total of non-face-to-face time during this encounter including median intraservice time, reviewing previous notes, investigations, ordering medications, medical decision making, coordinating care and patient verbalized understanding at the end of the visit.    Phill Myron, D.O. Primary Care at [facility]  04/24/2021, 10:35 AM

## 2021-04-25 DIAGNOSIS — L89623 Pressure ulcer of left heel, stage 3: Secondary | ICD-10-CM | POA: Diagnosis not present

## 2021-04-25 DIAGNOSIS — Z681 Body mass index (BMI) 19 or less, adult: Secondary | ICD-10-CM | POA: Diagnosis not present

## 2021-04-25 DIAGNOSIS — L89892 Pressure ulcer of other site, stage 2: Secondary | ICD-10-CM | POA: Diagnosis not present

## 2021-04-25 DIAGNOSIS — L89150 Pressure ulcer of sacral region, unstageable: Secondary | ICD-10-CM | POA: Diagnosis not present

## 2021-04-25 DIAGNOSIS — L89610 Pressure ulcer of right heel, unstageable: Secondary | ICD-10-CM | POA: Diagnosis not present

## 2021-04-25 DIAGNOSIS — E43 Unspecified severe protein-calorie malnutrition: Secondary | ICD-10-CM | POA: Diagnosis not present

## 2021-04-25 NOTE — Telephone Encounter (Signed)
Please consult with patient's current PCP by 04/29/2021 on finding for the patient an MD or DO for optimal management of care.

## 2021-04-25 NOTE — Telephone Encounter (Signed)
We may need to reassign to someone at [facility] while awaiting next physician to start here in Aug.   Phill Myron, D.O. Primary Care at [facility]  04/25/2021, 8:49 AM

## 2021-04-28 DIAGNOSIS — L89623 Pressure ulcer of left heel, stage 3: Secondary | ICD-10-CM | POA: Diagnosis not present

## 2021-04-28 DIAGNOSIS — Z681 Body mass index (BMI) 19 or less, adult: Secondary | ICD-10-CM | POA: Diagnosis not present

## 2021-04-28 DIAGNOSIS — E43 Unspecified severe protein-calorie malnutrition: Secondary | ICD-10-CM | POA: Diagnosis not present

## 2021-04-28 DIAGNOSIS — L89892 Pressure ulcer of other site, stage 2: Secondary | ICD-10-CM | POA: Diagnosis not present

## 2021-04-28 DIAGNOSIS — L89150 Pressure ulcer of sacral region, unstageable: Secondary | ICD-10-CM | POA: Diagnosis not present

## 2021-04-28 DIAGNOSIS — L89610 Pressure ulcer of right heel, unstageable: Secondary | ICD-10-CM | POA: Diagnosis not present

## 2021-04-28 NOTE — Telephone Encounter (Signed)
pls refer to Dr. Juleen China note regarding continuity of care.

## 2021-04-29 DIAGNOSIS — Z681 Body mass index (BMI) 19 or less, adult: Secondary | ICD-10-CM | POA: Diagnosis not present

## 2021-04-29 DIAGNOSIS — L89623 Pressure ulcer of left heel, stage 3: Secondary | ICD-10-CM | POA: Diagnosis not present

## 2021-04-29 DIAGNOSIS — L89610 Pressure ulcer of right heel, unstageable: Secondary | ICD-10-CM | POA: Diagnosis not present

## 2021-04-29 DIAGNOSIS — L89892 Pressure ulcer of other site, stage 2: Secondary | ICD-10-CM | POA: Diagnosis not present

## 2021-04-29 DIAGNOSIS — E43 Unspecified severe protein-calorie malnutrition: Secondary | ICD-10-CM | POA: Diagnosis not present

## 2021-04-29 DIAGNOSIS — L89150 Pressure ulcer of sacral region, unstageable: Secondary | ICD-10-CM | POA: Diagnosis not present

## 2021-05-01 DIAGNOSIS — E43 Unspecified severe protein-calorie malnutrition: Secondary | ICD-10-CM | POA: Diagnosis not present

## 2021-05-01 DIAGNOSIS — L89623 Pressure ulcer of left heel, stage 3: Secondary | ICD-10-CM | POA: Diagnosis not present

## 2021-05-01 DIAGNOSIS — Z681 Body mass index (BMI) 19 or less, adult: Secondary | ICD-10-CM | POA: Diagnosis not present

## 2021-05-01 DIAGNOSIS — L89150 Pressure ulcer of sacral region, unstageable: Secondary | ICD-10-CM | POA: Diagnosis not present

## 2021-05-01 DIAGNOSIS — L89610 Pressure ulcer of right heel, unstageable: Secondary | ICD-10-CM | POA: Diagnosis not present

## 2021-05-01 DIAGNOSIS — L89892 Pressure ulcer of other site, stage 2: Secondary | ICD-10-CM | POA: Diagnosis not present

## 2021-05-01 NOTE — Telephone Encounter (Signed)
If they would like to remain at [facility], Dr Redmond Pulling starts in August and can assume care there. Another option is [facility]. I do not know the patient to make a recommendation for or against hospice.

## 2021-05-01 NOTE — Telephone Encounter (Signed)
Called pt's daughter in law [name]  [phone] and informed her that Durene Fruits NP would prefer pt to find an MD or DO for optimal management of care.  I asked if they would like to go to [facility] to continue care or go to [place]. [name] was upset and stated "this is a great time for your office to be doing this when I was told by Juleen China that Amy would take over her father in laws care". [name] then stated she wanted to talk to [facility]. Pt is bedridden and cannot go to a Dr's office and stating that she wants Juleen China to speak to the medical provider (MD or DO) that will be taking over the care for her father in law.

## 2021-05-02 DIAGNOSIS — E43 Unspecified severe protein-calorie malnutrition: Secondary | ICD-10-CM | POA: Diagnosis not present

## 2021-05-02 DIAGNOSIS — L89150 Pressure ulcer of sacral region, unstageable: Secondary | ICD-10-CM | POA: Diagnosis not present

## 2021-05-02 DIAGNOSIS — L89892 Pressure ulcer of other site, stage 2: Secondary | ICD-10-CM | POA: Diagnosis not present

## 2021-05-02 DIAGNOSIS — L89623 Pressure ulcer of left heel, stage 3: Secondary | ICD-10-CM | POA: Diagnosis not present

## 2021-05-02 DIAGNOSIS — Z681 Body mass index (BMI) 19 or less, adult: Secondary | ICD-10-CM | POA: Diagnosis not present

## 2021-05-02 DIAGNOSIS — L89610 Pressure ulcer of right heel, unstageable: Secondary | ICD-10-CM | POA: Diagnosis not present

## 2021-05-05 DIAGNOSIS — Z681 Body mass index (BMI) 19 or less, adult: Secondary | ICD-10-CM | POA: Diagnosis not present

## 2021-05-05 DIAGNOSIS — L89892 Pressure ulcer of other site, stage 2: Secondary | ICD-10-CM | POA: Diagnosis not present

## 2021-05-05 DIAGNOSIS — L89610 Pressure ulcer of right heel, unstageable: Secondary | ICD-10-CM | POA: Diagnosis not present

## 2021-05-05 DIAGNOSIS — L89623 Pressure ulcer of left heel, stage 3: Secondary | ICD-10-CM | POA: Diagnosis not present

## 2021-05-05 DIAGNOSIS — L89150 Pressure ulcer of sacral region, unstageable: Secondary | ICD-10-CM | POA: Diagnosis not present

## 2021-05-05 DIAGNOSIS — E43 Unspecified severe protein-calorie malnutrition: Secondary | ICD-10-CM | POA: Diagnosis not present

## 2021-05-05 NOTE — Telephone Encounter (Signed)
What do we need to set this up for patient?  Does the provider need to send a referral?

## 2021-05-06 ENCOUNTER — Telehealth: Payer: Self-pay

## 2021-05-06 NOTE — Telephone Encounter (Signed)
Call placed to [facility], spoke to [place] who said that the patient was admitted to hospice care 04/24/2021.  They just need to confirm if a [facility] provider will manage patient's hospice care.  Informed her that this CM will  need to check with Dr Margarita Rana as Dr Juleen China is no longer working at the [facility] and no other providers have seen the patient.  This CM spoke to Dr Margarita Rana who advised [facility] assign one of their providers to manage patient's care.  Call placed to [facility], spoke to Tammy and left message for Alwyn Ren to call this CM back.

## 2021-05-07 DIAGNOSIS — L89623 Pressure ulcer of left heel, stage 3: Secondary | ICD-10-CM | POA: Diagnosis not present

## 2021-05-07 DIAGNOSIS — L89150 Pressure ulcer of sacral region, unstageable: Secondary | ICD-10-CM | POA: Diagnosis not present

## 2021-05-07 DIAGNOSIS — Z681 Body mass index (BMI) 19 or less, adult: Secondary | ICD-10-CM | POA: Diagnosis not present

## 2021-05-07 DIAGNOSIS — E43 Unspecified severe protein-calorie malnutrition: Secondary | ICD-10-CM | POA: Diagnosis not present

## 2021-05-07 DIAGNOSIS — L89610 Pressure ulcer of right heel, unstageable: Secondary | ICD-10-CM | POA: Diagnosis not present

## 2021-05-07 DIAGNOSIS — L89892 Pressure ulcer of other site, stage 2: Secondary | ICD-10-CM | POA: Diagnosis not present

## 2021-05-07 NOTE — Telephone Encounter (Signed)
Call placed to [facility] [phone], spoke to [facility] and informed her that Dr Margarita Rana recommends that a hospice provider manage patient's care.

## 2021-05-08 DIAGNOSIS — Z681 Body mass index (BMI) 19 or less, adult: Secondary | ICD-10-CM | POA: Diagnosis not present

## 2021-05-08 DIAGNOSIS — L89623 Pressure ulcer of left heel, stage 3: Secondary | ICD-10-CM | POA: Diagnosis not present

## 2021-05-08 DIAGNOSIS — L89150 Pressure ulcer of sacral region, unstageable: Secondary | ICD-10-CM | POA: Diagnosis not present

## 2021-05-08 DIAGNOSIS — L89892 Pressure ulcer of other site, stage 2: Secondary | ICD-10-CM | POA: Diagnosis not present

## 2021-05-08 DIAGNOSIS — L89610 Pressure ulcer of right heel, unstageable: Secondary | ICD-10-CM | POA: Diagnosis not present

## 2021-05-08 DIAGNOSIS — E43 Unspecified severe protein-calorie malnutrition: Secondary | ICD-10-CM | POA: Diagnosis not present

## 2021-05-09 ENCOUNTER — Telehealth: Payer: Self-pay

## 2021-05-09 DIAGNOSIS — E785 Hyperlipidemia, unspecified: Secondary | ICD-10-CM | POA: Diagnosis not present

## 2021-05-09 DIAGNOSIS — S129XXS Fracture of neck, unspecified, sequela: Secondary | ICD-10-CM | POA: Diagnosis not present

## 2021-05-09 DIAGNOSIS — R627 Adult failure to thrive: Secondary | ICD-10-CM | POA: Diagnosis not present

## 2021-05-09 DIAGNOSIS — I4891 Unspecified atrial fibrillation: Secondary | ICD-10-CM | POA: Diagnosis not present

## 2021-05-09 NOTE — Telephone Encounter (Signed)
I spoke with the patient son and daughter in law [place]. They states the hospice doctor Quillian Quince said the patient was actively dying. He will not make through the weekend. I called the hospice doctor to get a written order but got the voicemail that was not set up. I asked Dr. Rayann Heman and he gave me the order to let [employer] turn off the tachy therapies off.  I also spoke with Ronalee Belts from [employer] and he states he will go around 6 or 7 pm to turn the patient tachy therapies off.   I spoke with [name] to let her know that I got the order from Dr. Rayann Heman and [employer] will come to turn the therapies off. She verbalized understanding and thanked me for the call.

## 2021-05-09 NOTE — Telephone Encounter (Signed)
Per patient daughter, patient is set up with Hospice with Dr. Valda Favia.

## 2021-05-11 DIAGNOSIS — L89610 Pressure ulcer of right heel, unstageable: Secondary | ICD-10-CM | POA: Diagnosis not present

## 2021-05-11 DIAGNOSIS — L89623 Pressure ulcer of left heel, stage 3: Secondary | ICD-10-CM | POA: Diagnosis not present

## 2021-05-11 DIAGNOSIS — L89150 Pressure ulcer of sacral region, unstageable: Secondary | ICD-10-CM | POA: Diagnosis not present

## 2021-05-11 DIAGNOSIS — E43 Unspecified severe protein-calorie malnutrition: Secondary | ICD-10-CM | POA: Diagnosis not present

## 2021-05-11 DIAGNOSIS — L89892 Pressure ulcer of other site, stage 2: Secondary | ICD-10-CM | POA: Diagnosis not present

## 2021-05-11 DIAGNOSIS — Z681 Body mass index (BMI) 19 or less, adult: Secondary | ICD-10-CM | POA: Diagnosis not present

## 2021-05-12 DIAGNOSIS — L89610 Pressure ulcer of right heel, unstageable: Secondary | ICD-10-CM | POA: Diagnosis not present

## 2021-05-12 DIAGNOSIS — L89150 Pressure ulcer of sacral region, unstageable: Secondary | ICD-10-CM | POA: Diagnosis not present

## 2021-05-12 DIAGNOSIS — L89892 Pressure ulcer of other site, stage 2: Secondary | ICD-10-CM | POA: Diagnosis not present

## 2021-05-12 DIAGNOSIS — L89623 Pressure ulcer of left heel, stage 3: Secondary | ICD-10-CM | POA: Diagnosis not present

## 2021-05-12 DIAGNOSIS — E43 Unspecified severe protein-calorie malnutrition: Secondary | ICD-10-CM | POA: Diagnosis not present

## 2021-05-12 DIAGNOSIS — Z681 Body mass index (BMI) 19 or less, adult: Secondary | ICD-10-CM | POA: Diagnosis not present

## 2021-05-13 DIAGNOSIS — E785 Hyperlipidemia, unspecified: Secondary | ICD-10-CM | POA: Diagnosis not present

## 2021-05-13 DIAGNOSIS — I4891 Unspecified atrial fibrillation: Secondary | ICD-10-CM | POA: Diagnosis not present

## 2021-05-14 DIAGNOSIS — L89623 Pressure ulcer of left heel, stage 3: Secondary | ICD-10-CM | POA: Diagnosis not present

## 2021-05-14 DIAGNOSIS — L89610 Pressure ulcer of right heel, unstageable: Secondary | ICD-10-CM | POA: Diagnosis not present

## 2021-05-14 DIAGNOSIS — L89892 Pressure ulcer of other site, stage 2: Secondary | ICD-10-CM | POA: Diagnosis not present

## 2021-05-14 DIAGNOSIS — E43 Unspecified severe protein-calorie malnutrition: Secondary | ICD-10-CM | POA: Diagnosis not present

## 2021-05-14 DIAGNOSIS — L89150 Pressure ulcer of sacral region, unstageable: Secondary | ICD-10-CM | POA: Diagnosis not present

## 2021-05-14 DIAGNOSIS — Z681 Body mass index (BMI) 19 or less, adult: Secondary | ICD-10-CM | POA: Diagnosis not present

## 2021-05-15 DIAGNOSIS — E43 Unspecified severe protein-calorie malnutrition: Secondary | ICD-10-CM | POA: Diagnosis not present

## 2021-05-15 DIAGNOSIS — L89623 Pressure ulcer of left heel, stage 3: Secondary | ICD-10-CM | POA: Diagnosis not present

## 2021-05-15 DIAGNOSIS — Z681 Body mass index (BMI) 19 or less, adult: Secondary | ICD-10-CM | POA: Diagnosis not present

## 2021-05-15 DIAGNOSIS — L89610 Pressure ulcer of right heel, unstageable: Secondary | ICD-10-CM | POA: Diagnosis not present

## 2021-05-15 DIAGNOSIS — L89150 Pressure ulcer of sacral region, unstageable: Secondary | ICD-10-CM | POA: Diagnosis not present

## 2021-05-15 DIAGNOSIS — L89892 Pressure ulcer of other site, stage 2: Secondary | ICD-10-CM | POA: Diagnosis not present

## 2021-05-16 DIAGNOSIS — L89623 Pressure ulcer of left heel, stage 3: Secondary | ICD-10-CM | POA: Diagnosis not present

## 2021-05-16 DIAGNOSIS — Z681 Body mass index (BMI) 19 or less, adult: Secondary | ICD-10-CM | POA: Diagnosis not present

## 2021-05-16 DIAGNOSIS — E43 Unspecified severe protein-calorie malnutrition: Secondary | ICD-10-CM | POA: Diagnosis not present

## 2021-05-16 DIAGNOSIS — L89150 Pressure ulcer of sacral region, unstageable: Secondary | ICD-10-CM | POA: Diagnosis not present

## 2021-05-16 DIAGNOSIS — L89610 Pressure ulcer of right heel, unstageable: Secondary | ICD-10-CM | POA: Diagnosis not present

## 2021-05-16 DIAGNOSIS — L89892 Pressure ulcer of other site, stage 2: Secondary | ICD-10-CM | POA: Diagnosis not present

## 2021-05-17 DIAGNOSIS — L89892 Pressure ulcer of other site, stage 2: Secondary | ICD-10-CM | POA: Diagnosis not present

## 2021-05-17 DIAGNOSIS — L89623 Pressure ulcer of left heel, stage 3: Secondary | ICD-10-CM | POA: Diagnosis not present

## 2021-05-17 DIAGNOSIS — E43 Unspecified severe protein-calorie malnutrition: Secondary | ICD-10-CM | POA: Diagnosis not present

## 2021-05-17 DIAGNOSIS — L89150 Pressure ulcer of sacral region, unstageable: Secondary | ICD-10-CM | POA: Diagnosis not present

## 2021-05-17 DIAGNOSIS — Z681 Body mass index (BMI) 19 or less, adult: Secondary | ICD-10-CM | POA: Diagnosis not present

## 2021-05-17 DIAGNOSIS — L89610 Pressure ulcer of right heel, unstageable: Secondary | ICD-10-CM | POA: Diagnosis not present

## 2021-05-18 DIAGNOSIS — L89623 Pressure ulcer of left heel, stage 3: Secondary | ICD-10-CM | POA: Diagnosis not present

## 2021-05-18 DIAGNOSIS — Z681 Body mass index (BMI) 19 or less, adult: Secondary | ICD-10-CM | POA: Diagnosis not present

## 2021-05-18 DIAGNOSIS — L89150 Pressure ulcer of sacral region, unstageable: Secondary | ICD-10-CM | POA: Diagnosis not present

## 2021-05-18 DIAGNOSIS — L89610 Pressure ulcer of right heel, unstageable: Secondary | ICD-10-CM | POA: Diagnosis not present

## 2021-05-18 DIAGNOSIS — E43 Unspecified severe protein-calorie malnutrition: Secondary | ICD-10-CM | POA: Diagnosis not present

## 2021-05-18 DIAGNOSIS — L89892 Pressure ulcer of other site, stage 2: Secondary | ICD-10-CM | POA: Diagnosis not present

## 2021-05-19 DIAGNOSIS — L89610 Pressure ulcer of right heel, unstageable: Secondary | ICD-10-CM | POA: Diagnosis not present

## 2021-05-19 DIAGNOSIS — E43 Unspecified severe protein-calorie malnutrition: Secondary | ICD-10-CM | POA: Diagnosis not present

## 2021-05-19 DIAGNOSIS — L89150 Pressure ulcer of sacral region, unstageable: Secondary | ICD-10-CM | POA: Diagnosis not present

## 2021-05-19 DIAGNOSIS — Z681 Body mass index (BMI) 19 or less, adult: Secondary | ICD-10-CM | POA: Diagnosis not present

## 2021-05-19 DIAGNOSIS — L89892 Pressure ulcer of other site, stage 2: Secondary | ICD-10-CM | POA: Diagnosis not present

## 2021-05-19 DIAGNOSIS — L89623 Pressure ulcer of left heel, stage 3: Secondary | ICD-10-CM | POA: Diagnosis not present

## 2021-05-20 DIAGNOSIS — L89610 Pressure ulcer of right heel, unstageable: Secondary | ICD-10-CM | POA: Diagnosis not present

## 2021-05-20 DIAGNOSIS — Z681 Body mass index (BMI) 19 or less, adult: Secondary | ICD-10-CM | POA: Diagnosis not present

## 2021-05-20 DIAGNOSIS — L89892 Pressure ulcer of other site, stage 2: Secondary | ICD-10-CM | POA: Diagnosis not present

## 2021-05-20 DIAGNOSIS — L89623 Pressure ulcer of left heel, stage 3: Secondary | ICD-10-CM | POA: Diagnosis not present

## 2021-05-20 DIAGNOSIS — E43 Unspecified severe protein-calorie malnutrition: Secondary | ICD-10-CM | POA: Diagnosis not present

## 2021-05-20 DIAGNOSIS — L89150 Pressure ulcer of sacral region, unstageable: Secondary | ICD-10-CM | POA: Diagnosis not present

## 2021-05-21 DIAGNOSIS — L89610 Pressure ulcer of right heel, unstageable: Secondary | ICD-10-CM | POA: Diagnosis not present

## 2021-05-21 DIAGNOSIS — L89892 Pressure ulcer of other site, stage 2: Secondary | ICD-10-CM | POA: Diagnosis not present

## 2021-05-21 DIAGNOSIS — Z681 Body mass index (BMI) 19 or less, adult: Secondary | ICD-10-CM | POA: Diagnosis not present

## 2021-05-21 DIAGNOSIS — E43 Unspecified severe protein-calorie malnutrition: Secondary | ICD-10-CM | POA: Diagnosis not present

## 2021-05-21 DIAGNOSIS — L89623 Pressure ulcer of left heel, stage 3: Secondary | ICD-10-CM | POA: Diagnosis not present

## 2021-05-21 DIAGNOSIS — L89150 Pressure ulcer of sacral region, unstageable: Secondary | ICD-10-CM | POA: Diagnosis not present

## 2021-05-22 DIAGNOSIS — L89610 Pressure ulcer of right heel, unstageable: Secondary | ICD-10-CM | POA: Diagnosis not present

## 2021-05-22 DIAGNOSIS — L89150 Pressure ulcer of sacral region, unstageable: Secondary | ICD-10-CM | POA: Diagnosis not present

## 2021-05-22 DIAGNOSIS — L89623 Pressure ulcer of left heel, stage 3: Secondary | ICD-10-CM | POA: Diagnosis not present

## 2021-05-22 DIAGNOSIS — E43 Unspecified severe protein-calorie malnutrition: Secondary | ICD-10-CM | POA: Diagnosis not present

## 2021-05-22 DIAGNOSIS — Z681 Body mass index (BMI) 19 or less, adult: Secondary | ICD-10-CM | POA: Diagnosis not present

## 2021-05-22 DIAGNOSIS — L89892 Pressure ulcer of other site, stage 2: Secondary | ICD-10-CM | POA: Diagnosis not present

## 2021-05-23 DIAGNOSIS — L89150 Pressure ulcer of sacral region, unstageable: Secondary | ICD-10-CM | POA: Diagnosis not present

## 2021-05-23 DIAGNOSIS — Z8781 Personal history of (healed) traumatic fracture: Secondary | ICD-10-CM | POA: Diagnosis not present

## 2021-05-23 DIAGNOSIS — Z681 Body mass index (BMI) 19 or less, adult: Secondary | ICD-10-CM | POA: Diagnosis not present

## 2021-05-23 DIAGNOSIS — I951 Orthostatic hypotension: Secondary | ICD-10-CM | POA: Diagnosis not present

## 2021-05-23 DIAGNOSIS — K219 Gastro-esophageal reflux disease without esophagitis: Secondary | ICD-10-CM | POA: Diagnosis not present

## 2021-05-23 DIAGNOSIS — L89623 Pressure ulcer of left heel, stage 3: Secondary | ICD-10-CM | POA: Diagnosis not present

## 2021-05-23 DIAGNOSIS — I5022 Chronic systolic (congestive) heart failure: Secondary | ICD-10-CM | POA: Diagnosis not present

## 2021-05-23 DIAGNOSIS — I4891 Unspecified atrial fibrillation: Secondary | ICD-10-CM | POA: Diagnosis not present

## 2021-05-23 DIAGNOSIS — L89892 Pressure ulcer of other site, stage 2: Secondary | ICD-10-CM | POA: Diagnosis not present

## 2021-05-23 DIAGNOSIS — E43 Unspecified severe protein-calorie malnutrition: Secondary | ICD-10-CM | POA: Diagnosis not present

## 2021-05-23 DIAGNOSIS — L89610 Pressure ulcer of right heel, unstageable: Secondary | ICD-10-CM | POA: Diagnosis not present

## 2021-05-23 DIAGNOSIS — R159 Full incontinence of feces: Secondary | ICD-10-CM | POA: Diagnosis not present

## 2021-05-23 DIAGNOSIS — M1990 Unspecified osteoarthritis, unspecified site: Secondary | ICD-10-CM | POA: Diagnosis not present

## 2021-05-23 DIAGNOSIS — R32 Unspecified urinary incontinence: Secondary | ICD-10-CM | POA: Diagnosis not present

## 2021-05-23 DIAGNOSIS — F32A Depression, unspecified: Secondary | ICD-10-CM | POA: Diagnosis not present

## 2021-05-23 DIAGNOSIS — Z950 Presence of cardiac pacemaker: Secondary | ICD-10-CM | POA: Diagnosis not present

## 2021-05-23 DIAGNOSIS — J9611 Chronic respiratory failure with hypoxia: Secondary | ICD-10-CM | POA: Diagnosis not present

## 2021-05-23 DIAGNOSIS — E039 Hypothyroidism, unspecified: Secondary | ICD-10-CM | POA: Diagnosis not present

## 2021-05-23 DIAGNOSIS — Z95810 Presence of automatic (implantable) cardiac defibrillator: Secondary | ICD-10-CM | POA: Diagnosis not present

## 2021-05-23 DIAGNOSIS — I472 Ventricular tachycardia: Secondary | ICD-10-CM | POA: Diagnosis not present

## 2021-05-23 DIAGNOSIS — E785 Hyperlipidemia, unspecified: Secondary | ICD-10-CM | POA: Diagnosis not present

## 2021-05-24 DIAGNOSIS — L89150 Pressure ulcer of sacral region, unstageable: Secondary | ICD-10-CM | POA: Diagnosis not present

## 2021-05-24 DIAGNOSIS — E43 Unspecified severe protein-calorie malnutrition: Secondary | ICD-10-CM | POA: Diagnosis not present

## 2021-05-24 DIAGNOSIS — L89623 Pressure ulcer of left heel, stage 3: Secondary | ICD-10-CM | POA: Diagnosis not present

## 2021-05-24 DIAGNOSIS — Z681 Body mass index (BMI) 19 or less, adult: Secondary | ICD-10-CM | POA: Diagnosis not present

## 2021-05-24 DIAGNOSIS — L89892 Pressure ulcer of other site, stage 2: Secondary | ICD-10-CM | POA: Diagnosis not present

## 2021-05-24 DIAGNOSIS — L89610 Pressure ulcer of right heel, unstageable: Secondary | ICD-10-CM | POA: Diagnosis not present

## 2021-06-23 DEATH — deceased

## 2021-12-27 IMAGING — RF DG CERVICAL SPINE 1V
1 series · 1 of 1 positions shown · non-contrast
Comparison: 01/08/2021

CLINICAL DATA: ACDF

EXAM:
DG CERVICAL SPINE - 1 VIEW; DG C-ARM 1-60 MIN

[Series 1: run · 1 of 1 slices shown]
[im 1/1]
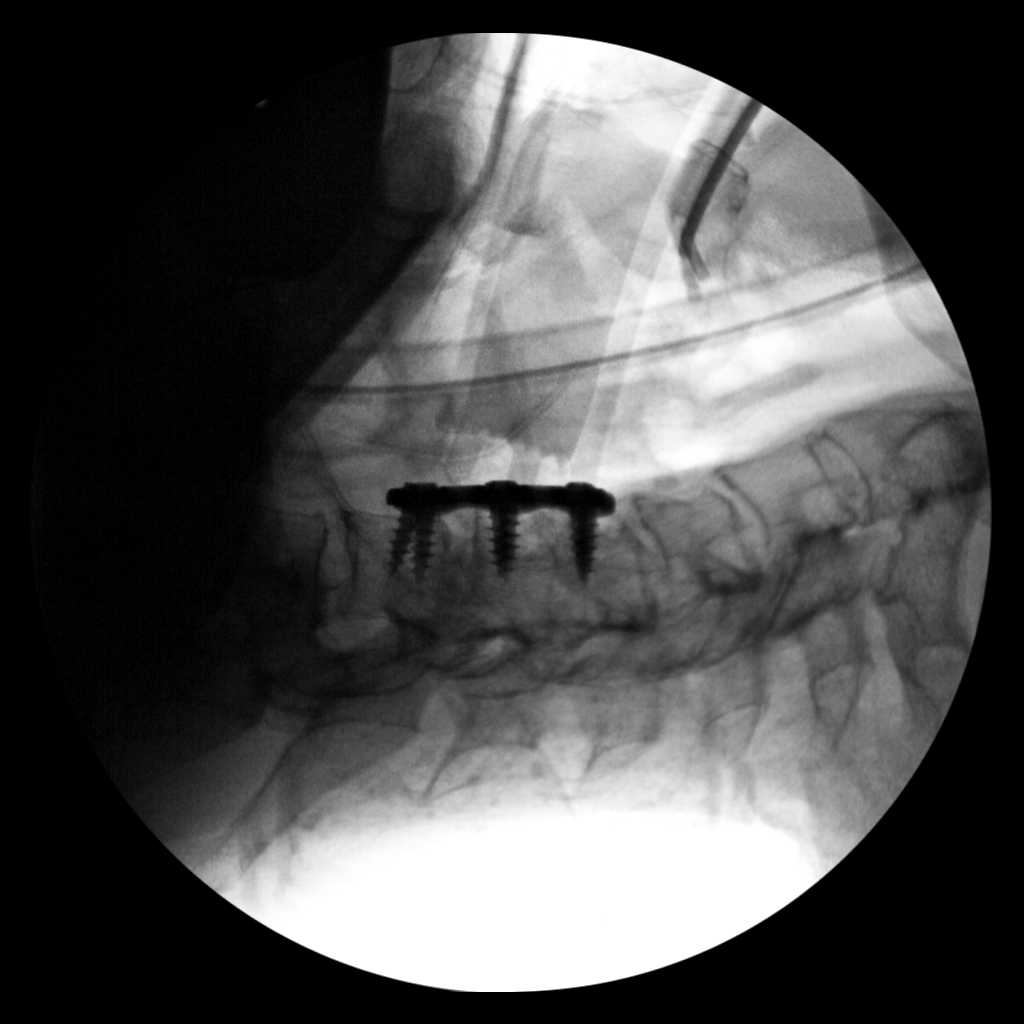

[1 of 1 positions shown; findings below may reference images not displayed]

FINDINGS: Single fluoroscopic image was obtained during the performance of the
procedure and is provided for interpretation only. Postsurgical
changes from ACDF spanning C5 through C6. Anatomic alignment at the
surgical levels. Persistent cervical kyphosis centered at C4-5, with
prominent spondylosis at that level.

FLUOROSCOPY TIME:  29 seconds
IMPRESSION: 1. C5-C7 ACDF.  Please refer to the operative report.
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
## 2021-12-31 IMAGING — DX DG CHEST 1V PORT
1 series · 2 of 2 positions shown · non-contrast
Comparison: 01/10/2021

CLINICAL DATA: Hypoxia

EXAM:
PORTABLE CHEST 1 VIEW

[Series 1: chest · 0.14mm/px · 2 of 2 slices shown]
[im 1/2]
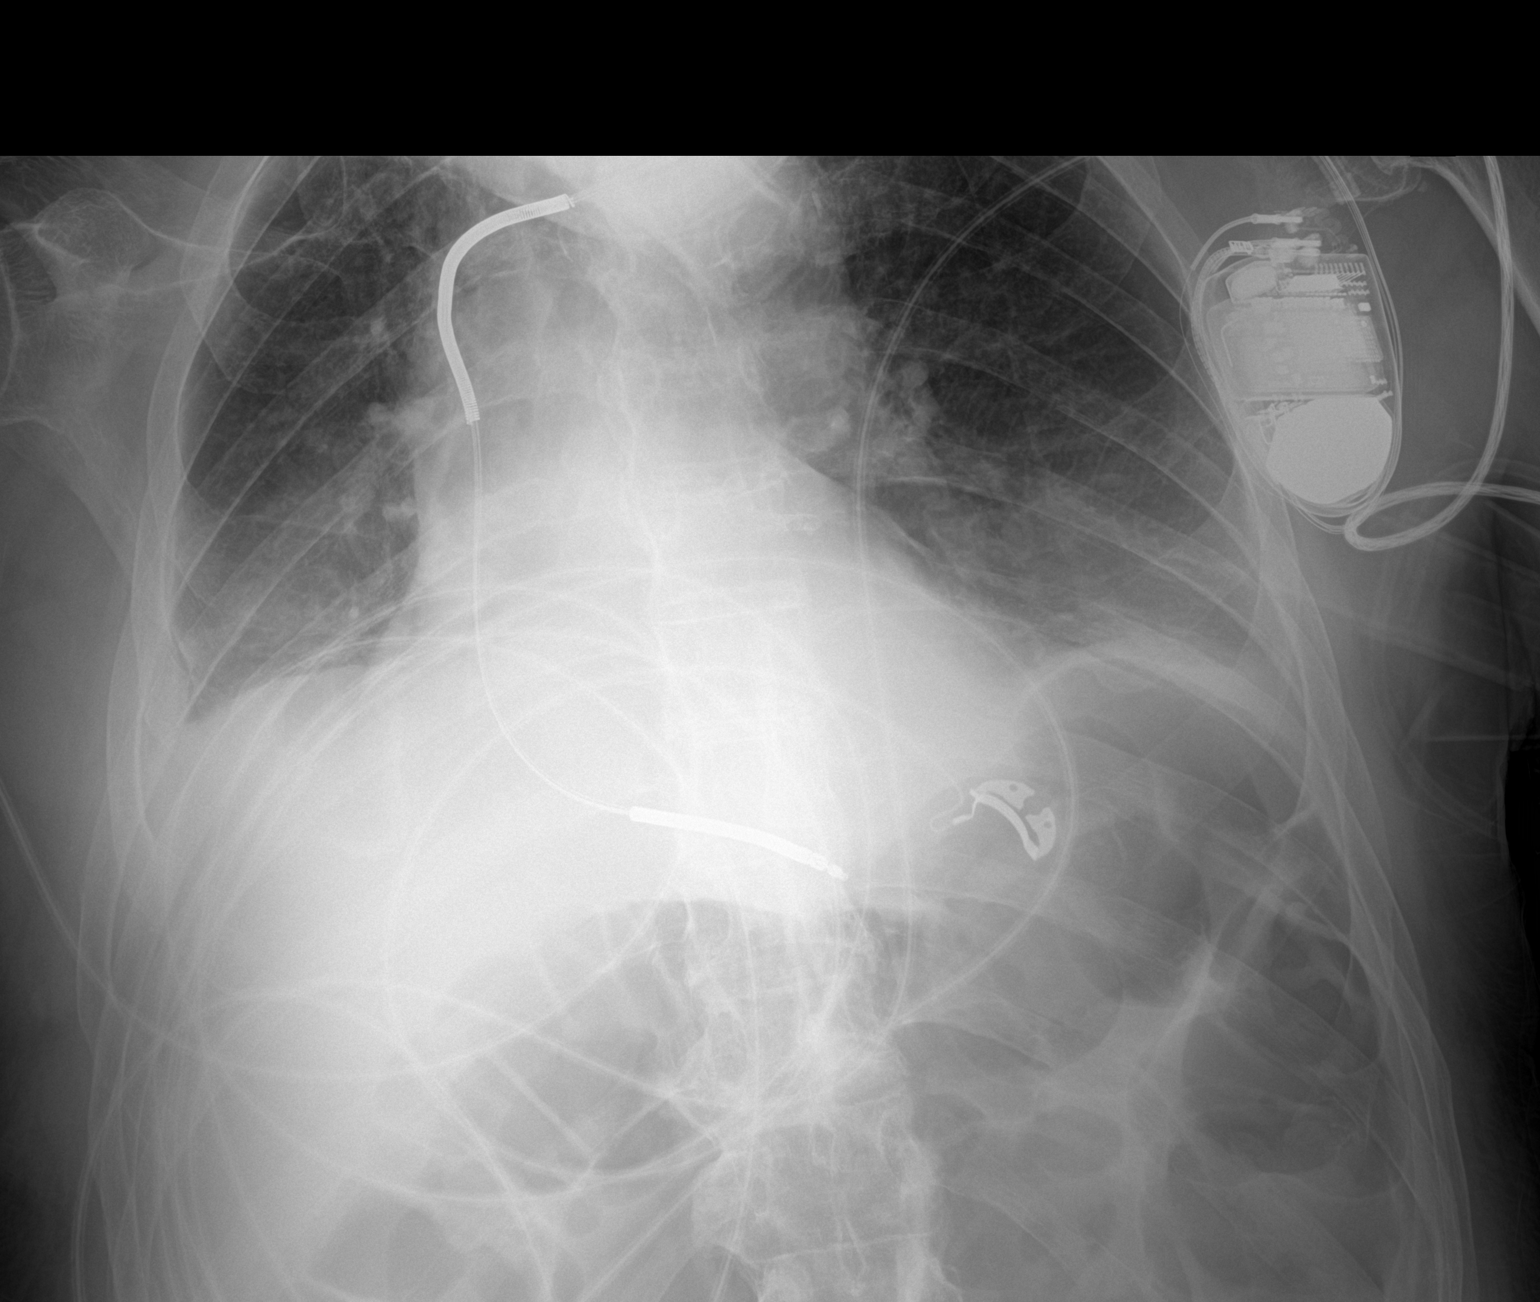
[im 2/2]
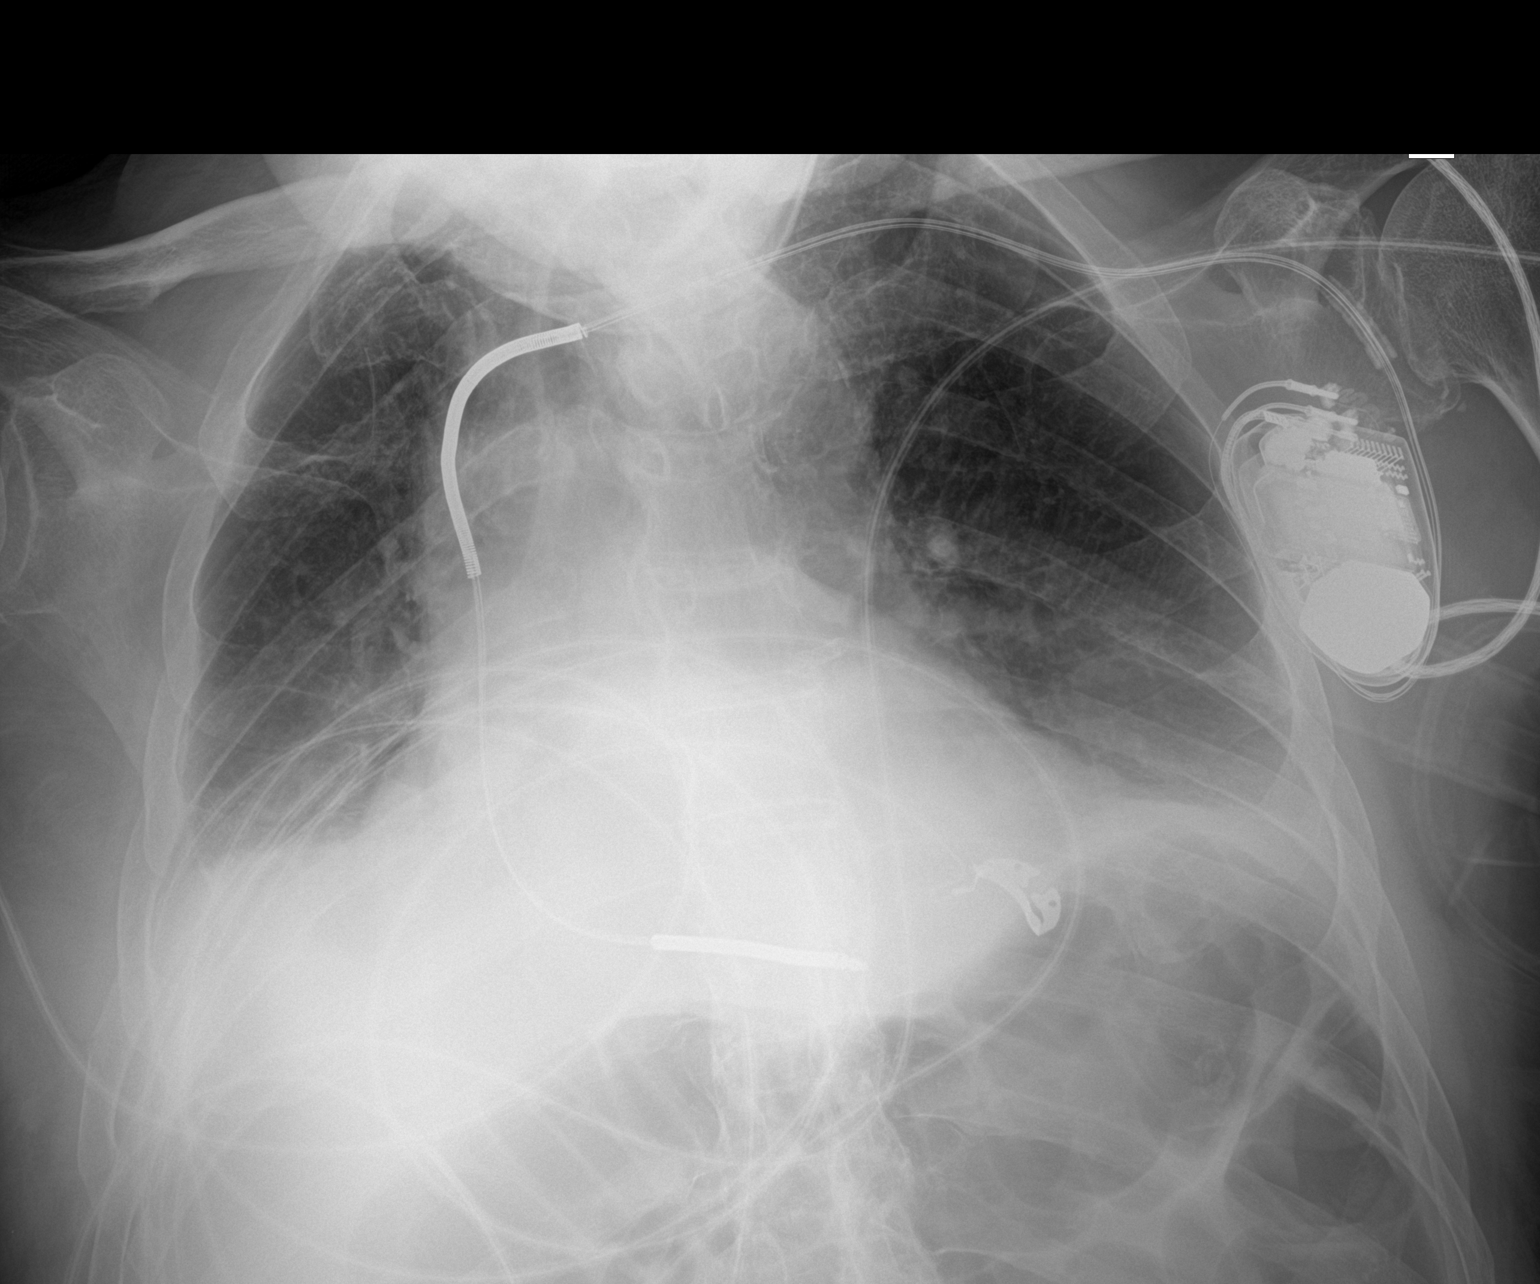

[2 of 2 positions shown; findings below may reference images not displayed]

FINDINGS: AICD in satisfactory position and stable.

Progression of bibasilar airspace disease. Small bilateral pleural
effusions. Upper lobes are clear.
IMPRESSION: Progression of bibasilar airspace disease and small effusions.
Possible fluid overload. No edema.

## 2022-01-03 IMAGING — DX DG CHEST 1V PORT
1 series · 1 of 1 positions shown · non-contrast
Comparison: Chest radiograph 01/14/2021, CT 01/08/2021

CLINICAL DATA: Ileus, respiratory failure

EXAM:
PORTABLE CHEST - 1 VIEW; ABDOMEN - 1 VIEW

[chest]
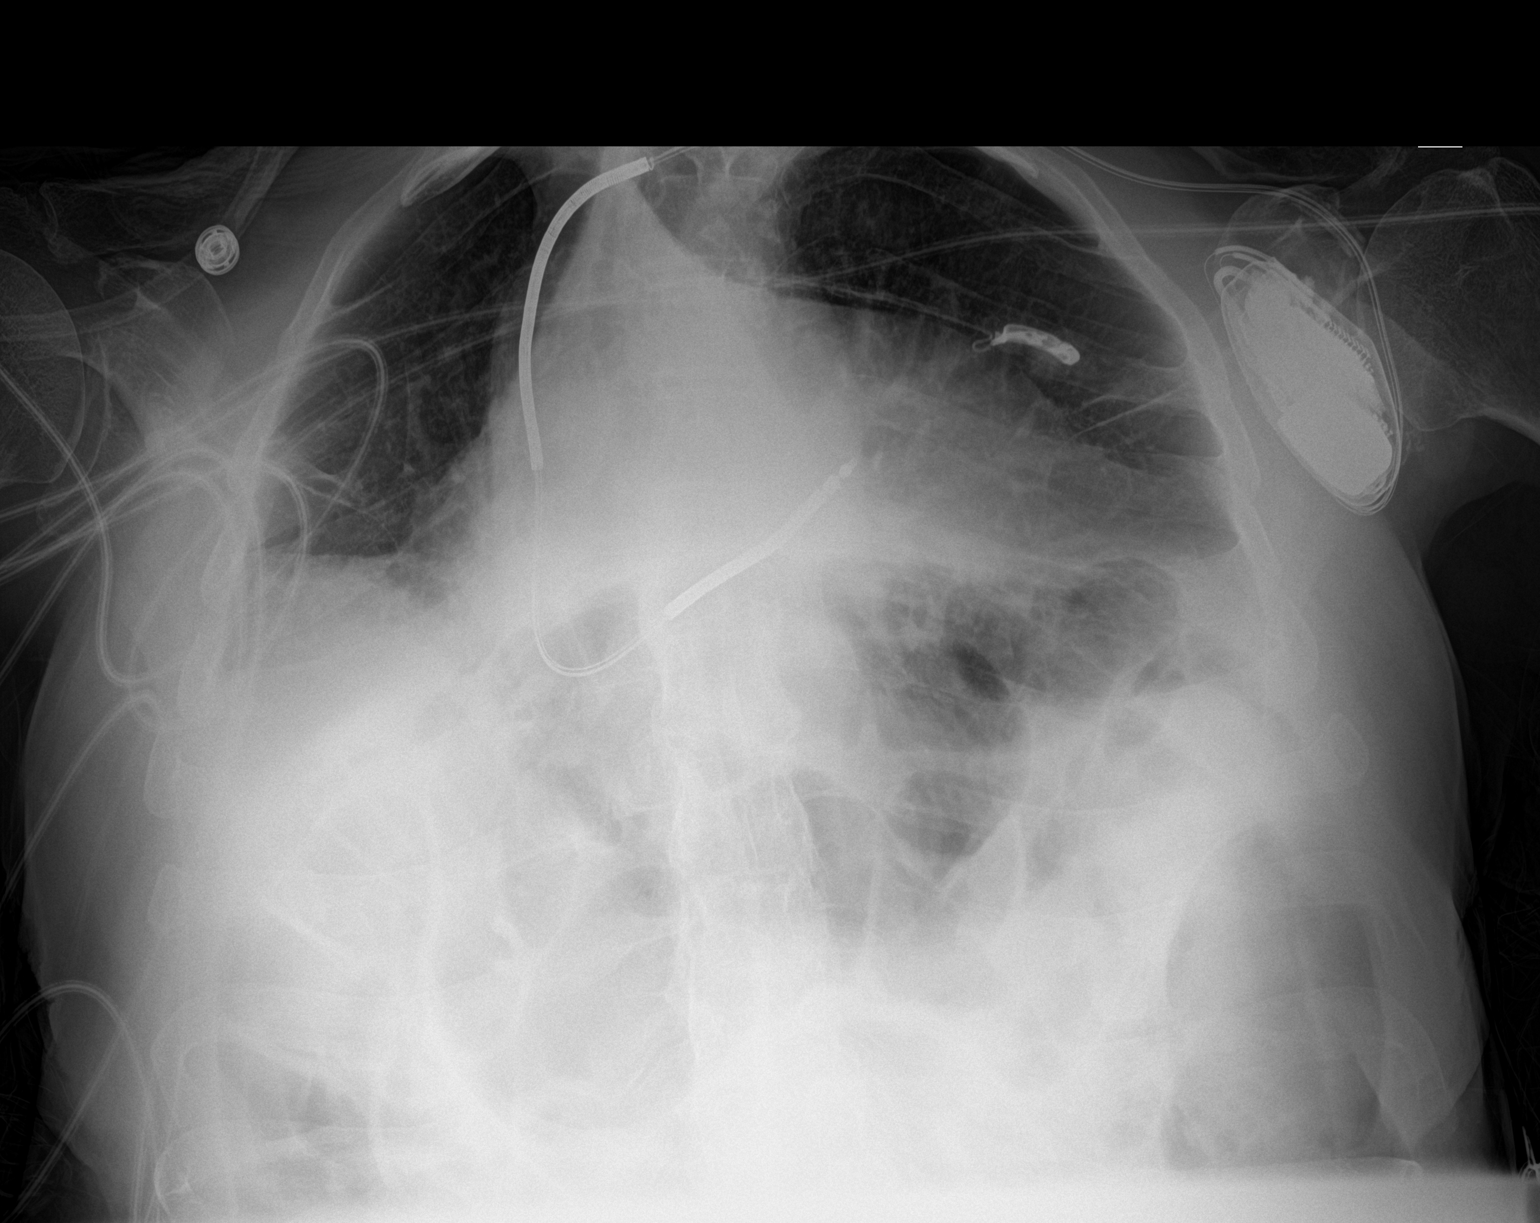

[1 of 1 positions shown; findings below may reference images not displayed]

FINDINGS: Markedly diminished lung volumes with bibasilar atelectasis. Suspect
small layering effusions as well. No new airspace disease. No
convincing features of edema. Stable positioning of a left chest
wall battery pack and pacer/defibrillator lead. Additional telemetry
leads overlie the chest. No acute osseous or soft tissue
abnormality. Degenerative changes in the shoulders and spine.
Additional telemetry leads overlie the chest.

Diffuse gaseous distension of both large and small bowel most
suggestive of an ileus particularly given the setting of recent
trauma. Redemonstration of the right-sided pelvic fractures better
detailed on CT imaging. Postsurgical changes from prior lumbosacral
fusion. No new osseous or soft tissue abnormalities are seen.
IMPRESSION: 1. Slightly diminished lung volumes with bibasilar atelectasis and
probable small layering effusions.
2. Diffuse gaseous distension of both large and small bowel most
suggestive of an ileus in the setting of recent trauma.
